# Patient Record
Sex: Female | Born: 1970 | Race: White | Hispanic: No | Marital: Married | Smoking: Never smoker
Health system: Southern US, Community
[De-identification: ages and names within clinical notes are randomized; demographics above are authoritative.]

## PROBLEM LIST (undated history)

## (undated) DIAGNOSIS — E66812 Obesity, class 2: Secondary | ICD-10-CM

## (undated) DIAGNOSIS — I10 Essential (primary) hypertension: Secondary | ICD-10-CM

## (undated) DIAGNOSIS — R569 Unspecified convulsions: Secondary | ICD-10-CM

## (undated) DIAGNOSIS — Z8619 Personal history of other infectious and parasitic diseases: Secondary | ICD-10-CM

## (undated) DIAGNOSIS — K760 Fatty (change of) liver, not elsewhere classified: Secondary | ICD-10-CM

## (undated) DIAGNOSIS — E119 Type 2 diabetes mellitus without complications: Principal | ICD-10-CM

## (undated) DIAGNOSIS — D689 Coagulation defect, unspecified: Secondary | ICD-10-CM

## (undated) DIAGNOSIS — E042 Nontoxic multinodular goiter: Secondary | ICD-10-CM

## (undated) DIAGNOSIS — M6788 Other specified disorders of synovium and tendon, other site: Secondary | ICD-10-CM

## (undated) DIAGNOSIS — I82409 Acute embolism and thrombosis of unspecified deep veins of unspecified lower extremity: Secondary | ICD-10-CM

## (undated) DIAGNOSIS — Z8672 Personal history of thrombophlebitis: Secondary | ICD-10-CM

## (undated) DIAGNOSIS — E669 Obesity, unspecified: Secondary | ICD-10-CM

## (undated) DIAGNOSIS — K8020 Calculus of gallbladder without cholecystitis without obstruction: Secondary | ICD-10-CM

## (undated) DIAGNOSIS — U071 COVID-19: Secondary | ICD-10-CM

## (undated) DIAGNOSIS — G8929 Other chronic pain: Secondary | ICD-10-CM

## (undated) DIAGNOSIS — K219 Gastro-esophageal reflux disease without esophagitis: Principal | ICD-10-CM

## (undated) DIAGNOSIS — E785 Hyperlipidemia, unspecified: Secondary | ICD-10-CM

## (undated) HISTORY — PX: OTHER SURGICAL HISTORY: SHX169

## (undated) HISTORY — DX: Nontoxic multinodular goiter: E04.2

## (undated) HISTORY — PX: WISDOM TOOTH EXTRACTION: SHX21

## (undated) HISTORY — DX: Obesity, class 2: E66.812

## (undated) HISTORY — DX: Type 2 diabetes mellitus without complications: E11.9

## (undated) HISTORY — DX: Calculus of gallbladder without cholecystitis without obstruction: K80.20

## (undated) HISTORY — DX: Other chronic pain: G89.29

## (undated) HISTORY — DX: Other specified disorders of synovium and tendon, other site: M67.88

## (undated) HISTORY — DX: Unspecified convulsions: R56.9

## (undated) HISTORY — DX: Acute embolism and thrombosis of unspecified deep veins of unspecified lower extremity: I82.409

## (undated) HISTORY — DX: Personal history of other infectious and parasitic diseases: Z86.19

## (undated) HISTORY — PX: CARDIOVASCULAR STRESS TEST: SHX262

## (undated) HISTORY — DX: Fatty (change of) liver, not elsewhere classified: K76.0

## (undated) HISTORY — PX: TRANSTHORACIC ECHOCARDIOGRAM: SHX275

## (undated) HISTORY — DX: COVID-19: U07.1

## (undated) HISTORY — DX: Hyperlipidemia, unspecified: E78.5

## (undated) HISTORY — DX: Personal history of thrombophlebitis: Z86.72

## (undated) HISTORY — DX: Essential (primary) hypertension: I10

## (undated) HISTORY — DX: Obesity, unspecified: E66.9

## (undated) HISTORY — DX: Gastro-esophageal reflux disease without esophagitis: K21.9

---
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
## 1997-12-25 ENCOUNTER — Other Ambulatory Visit: Admission: RE | Admit: 1997-12-25 | Discharge: 1997-12-25 | Payer: Self-pay | Admitting: Obstetrics and Gynecology

## 1998-10-17 ENCOUNTER — Other Ambulatory Visit: Admission: RE | Admit: 1998-10-17 | Discharge: 1998-10-17 | Payer: Self-pay | Admitting: *Deleted

## 1999-12-12 ENCOUNTER — Other Ambulatory Visit: Admission: RE | Admit: 1999-12-12 | Discharge: 1999-12-12 | Payer: Self-pay | Admitting: *Deleted

## 2000-11-05 ENCOUNTER — Other Ambulatory Visit: Admission: RE | Admit: 2000-11-05 | Discharge: 2000-11-05 | Payer: Self-pay | Admitting: *Deleted

## 2001-03-23 ENCOUNTER — Encounter: Admission: RE | Admit: 2001-03-23 | Discharge: 2001-03-23 | Payer: Self-pay | Admitting: *Deleted

## 2001-05-19 ENCOUNTER — Encounter: Payer: Self-pay | Admitting: Obstetrics and Gynecology

## 2001-05-19 ENCOUNTER — Encounter (INDEPENDENT_AMBULATORY_CARE_PROVIDER_SITE_OTHER): Payer: Self-pay

## 2001-05-19 ENCOUNTER — Inpatient Hospital Stay (HOSPITAL_COMMUNITY): Admission: AD | Admit: 2001-05-19 | Discharge: 2001-05-21 | Payer: Self-pay | Admitting: *Deleted

## 2001-06-03 ENCOUNTER — Inpatient Hospital Stay (HOSPITAL_COMMUNITY): Admission: AD | Admit: 2001-06-03 | Discharge: 2001-06-07 | Payer: Self-pay | Admitting: Obstetrics and Gynecology

## 2001-06-22 ENCOUNTER — Other Ambulatory Visit: Admission: RE | Admit: 2001-06-22 | Discharge: 2001-06-22 | Payer: Self-pay | Admitting: Obstetrics and Gynecology

## 2002-07-21 ENCOUNTER — Other Ambulatory Visit: Admission: RE | Admit: 2002-07-21 | Discharge: 2002-07-21 | Payer: Self-pay | Admitting: *Deleted

## 2003-01-31 ENCOUNTER — Encounter: Admission: RE | Admit: 2003-01-31 | Discharge: 2003-01-31 | Payer: Self-pay | Admitting: Obstetrics and Gynecology

## 2003-03-01 ENCOUNTER — Inpatient Hospital Stay (HOSPITAL_COMMUNITY): Admission: AD | Admit: 2003-03-01 | Discharge: 2003-03-01 | Payer: Self-pay | Admitting: Obstetrics and Gynecology

## 2003-03-14 ENCOUNTER — Inpatient Hospital Stay (HOSPITAL_COMMUNITY): Admission: AD | Admit: 2003-03-14 | Discharge: 2003-03-14 | Payer: Self-pay | Admitting: *Deleted

## 2003-03-23 ENCOUNTER — Inpatient Hospital Stay (HOSPITAL_COMMUNITY): Admission: RE | Admit: 2003-03-23 | Discharge: 2003-03-26 | Payer: Self-pay | Admitting: Obstetrics and Gynecology

## 2003-03-27 ENCOUNTER — Encounter: Admission: RE | Admit: 2003-03-27 | Discharge: 2003-04-26 | Payer: Self-pay | Admitting: *Deleted

## 2003-04-27 ENCOUNTER — Encounter: Admission: RE | Admit: 2003-04-27 | Discharge: 2003-05-27 | Payer: Self-pay | Admitting: *Deleted

## 2003-04-27 ENCOUNTER — Other Ambulatory Visit: Admission: RE | Admit: 2003-04-27 | Discharge: 2003-04-27 | Payer: Self-pay | Admitting: Obstetrics and Gynecology

## 2004-04-30 ENCOUNTER — Other Ambulatory Visit: Admission: RE | Admit: 2004-04-30 | Discharge: 2004-04-30 | Payer: Self-pay | Admitting: Obstetrics and Gynecology

## 2004-12-02 ENCOUNTER — Ambulatory Visit (HOSPITAL_COMMUNITY): Admission: RE | Admit: 2004-12-02 | Discharge: 2004-12-02 | Payer: Self-pay | Admitting: Obstetrics and Gynecology

## 2004-12-02 ENCOUNTER — Encounter (INDEPENDENT_AMBULATORY_CARE_PROVIDER_SITE_OTHER): Payer: Self-pay | Admitting: Specialist

## 2005-01-17 IMAGING — US US OB COMP +14 WK
1 series · 13 of 28 positions shown · non-contrast
Comparison: none

CLINICAL DATA: 32-year-old female at 32 weeks 5 days gestational age by LMP.  Cramping.

[Series 1: unknown · 0.28mm/px · 13 of 71 slices shown]
[im 3/71]
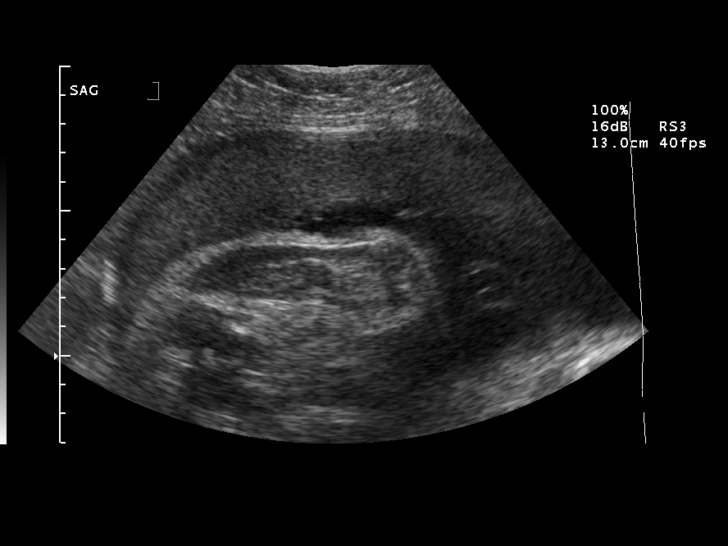
[im 8/71]
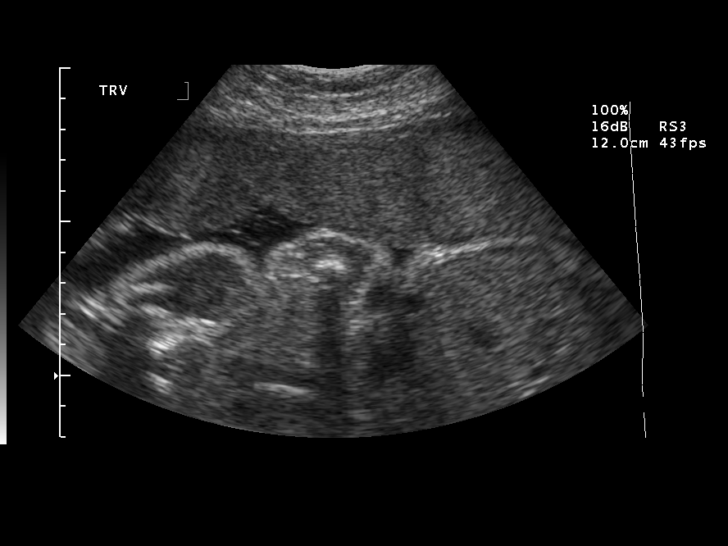
[im 13/71]
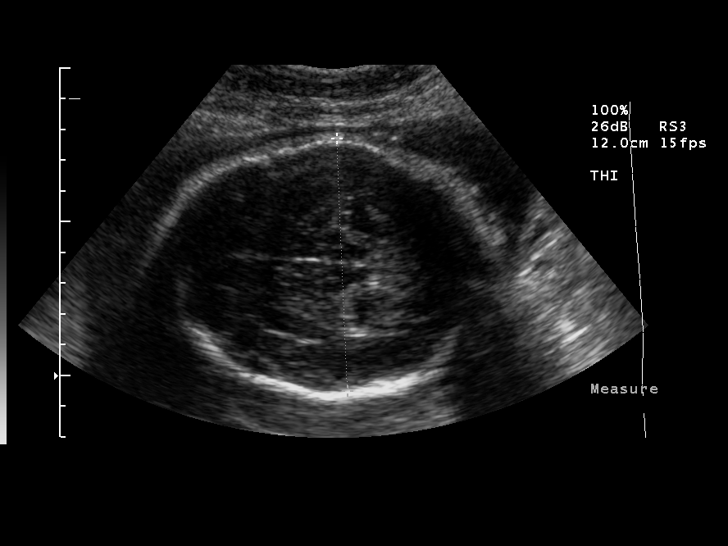
[im 19/71]
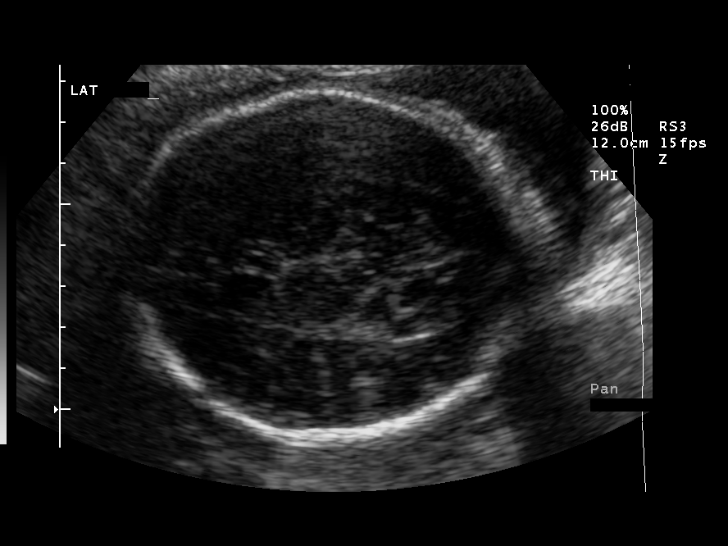
[im 24/71]
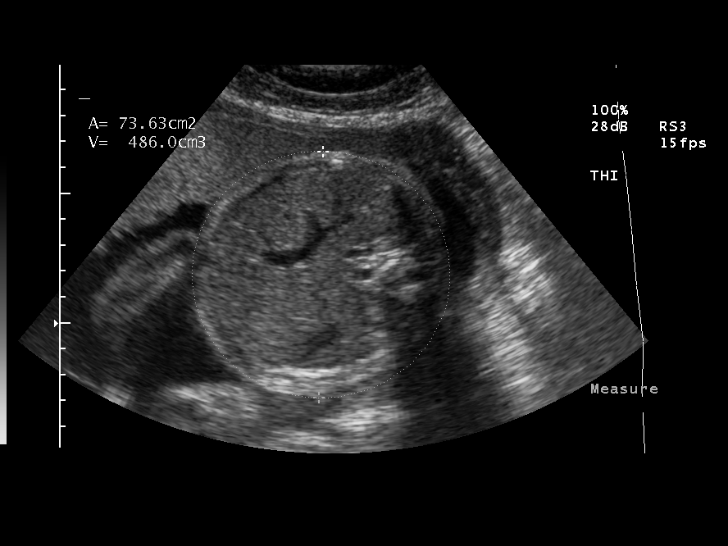
[im 29/71]
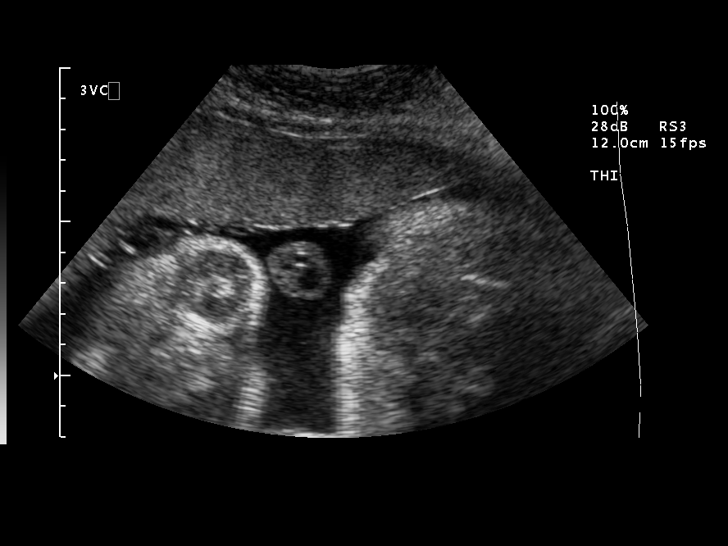
[im 37/71]
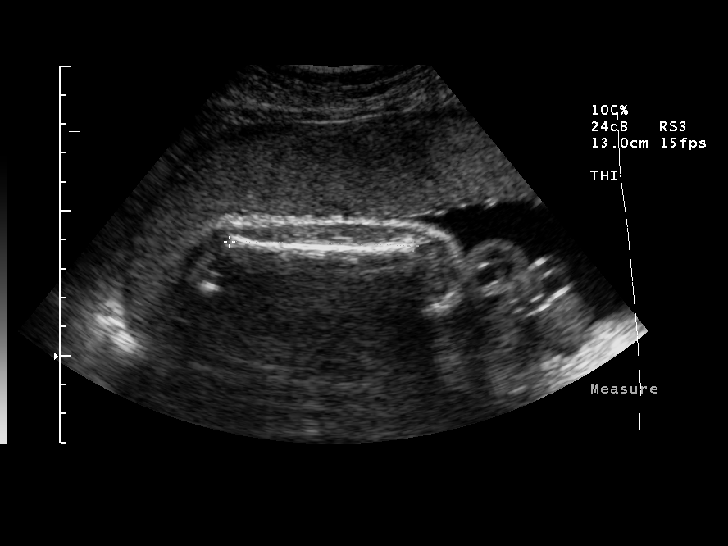
[im 42/71]
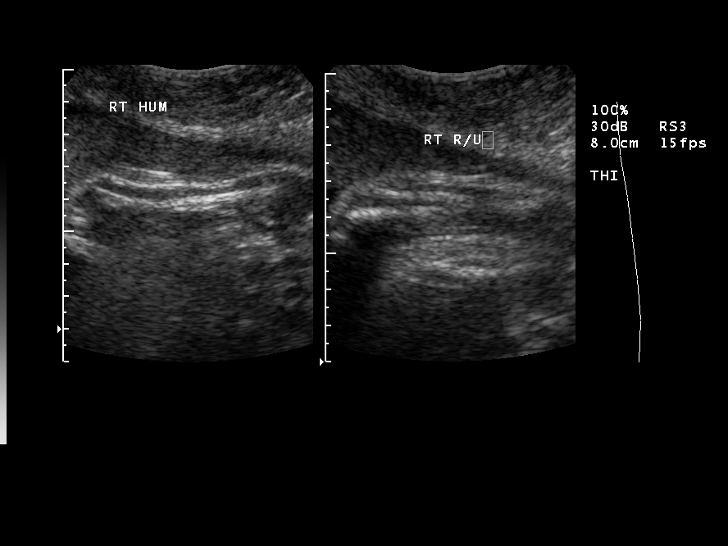
[im 47/71]
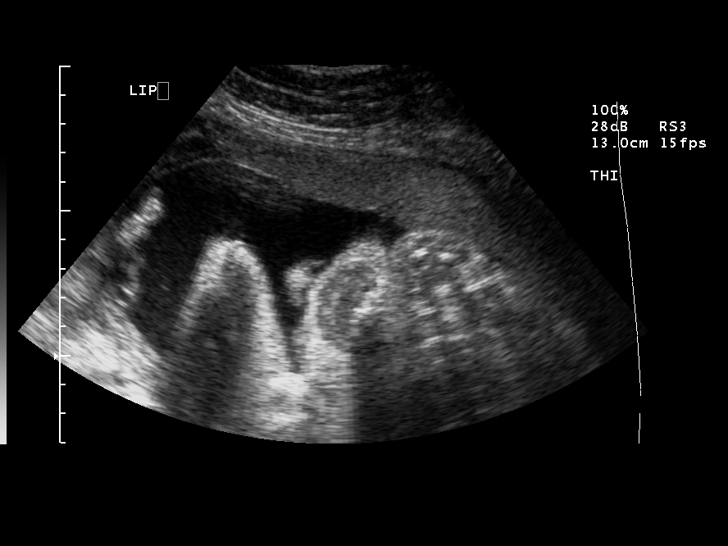
[im 52/71]
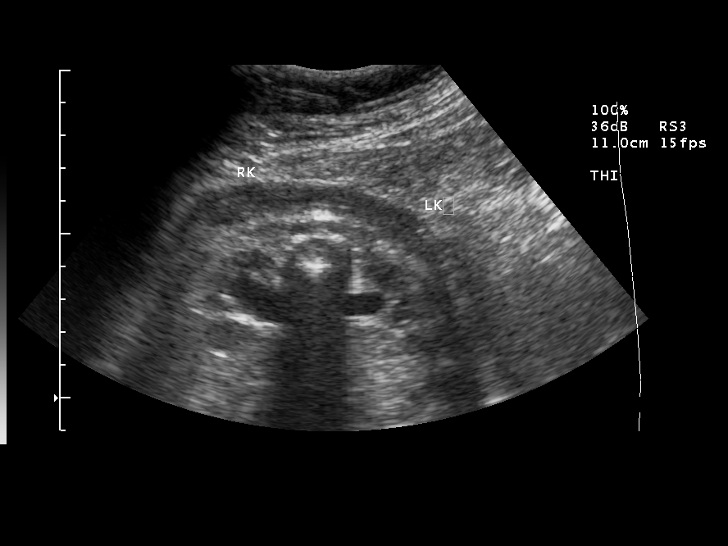
[im 58/71]
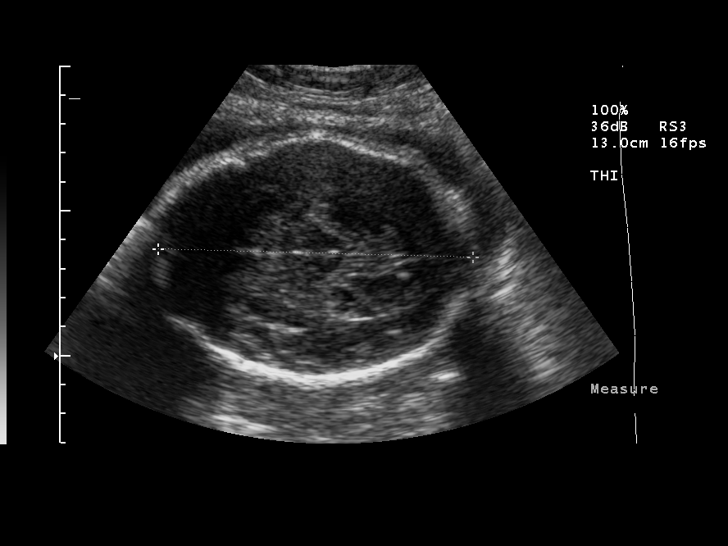
[im 63/71]
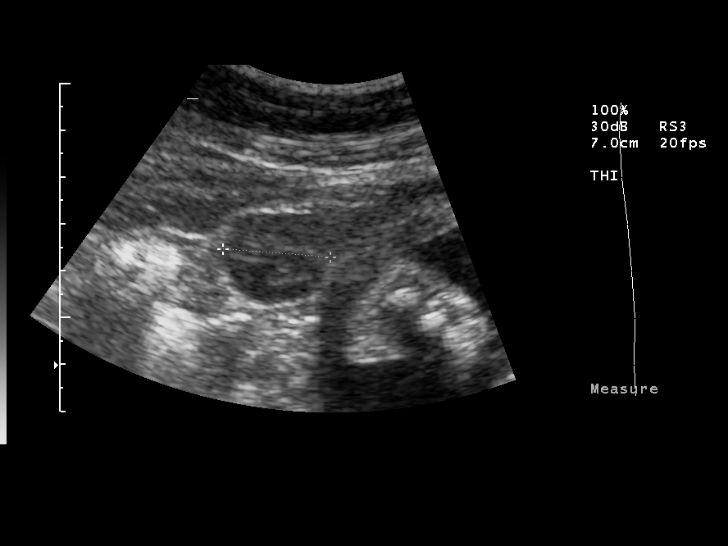
[im 68/71]
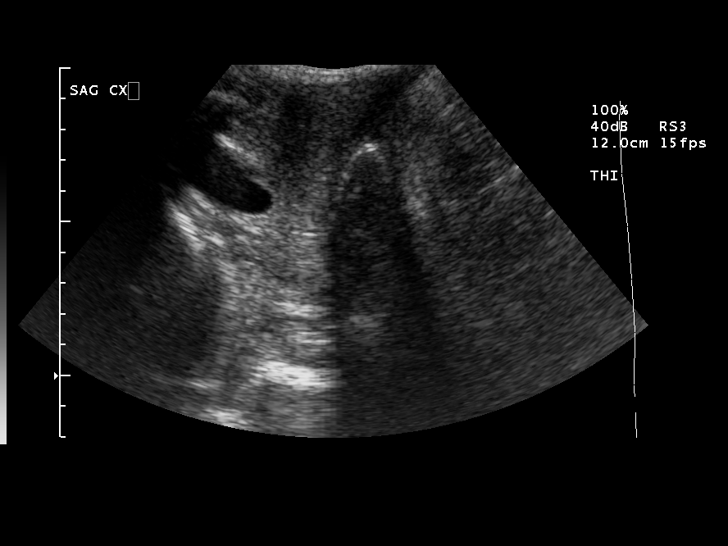

[13 of 28 positions shown; findings below may reference images not displayed]

OBSTETRICAL ULTRASOUND

 NUMBER OF FETUSES:  1
 HEART RATE:  125 BPM
 MOVEMENT:  Yes
 BREATHING:  Yes      
 PRESENTATION:  Cephalic
 PLACENTAL LOCATION:  Anterior
 GRADE:  I
 PREVIA:  No
 AMNIOTIC FLUID (SUBJECTIVE):  Normal
 AMNIOTIC FLUID (OBJECTIVE): 20.3 AFI (6th-36th %ile = 8.3 to 24.5 cm for 33 weeks)

 FETAL BIOMETRY
 BPD:   8.2 cm, 33W 0D
 HC:   30.0 cm, 33W 1D
 AC:  30.2 cm, 34W 2D
 FL:  6.3 cm, 32 W 5D
 MEAN GA: 33W 2D
 EFW: 9912 g (H) 75th-83th %ile (3145-3530 g) for 33 weeks

 FETAL ANATOMY
 LATERAL VENTRICLES:  Visualized   
 THALAMI/CSP:  Visualized     
 POSTERIOR FOSSA:  Visualized   
 NUCHAL REGION:  N/A  
 SPINE:  Not visualized     
 4 CHAMBER HEART ON LEFT:  Visualized 
 STOMACH ON LEFT:  Visualized     
 3 VESSEL CORD:  Visualized   
 CORD INSERTION SITE:  Not visualized   
 KIDNEYS:  Visualized   
 BLADDER:  Visualized   
 EXTREMITIES:  Not visualized     

 ADDITIONAL ANATOMY VISUALIZED:  Upper lip, orbits, profile, diaphragm, heel.
 Comment:  The right renal pelvis measures 11-12 mm in AP diameter.  The left renal pelvis measures 8-9 mm in AP diameter.  

 MATERNAL FINDINGS
 CERVIX: 3.1 cm translabially
IMPRESSION: Single living intrauterine fetus in cephalic presentation with subjectively and quantitatively normal amniotic fluid volume.  Mean gestational age by ultrasound today is 33 weeks 2 days, which correlates well with the gestational age by LMP.  
 Bilateral pyelectasis.  A repeat sonographic assessment in four weeks is recommended to further evaluate.
 The remaining visualized fetal anatomy is unremarkable although assessment is limited by the advanced gestational age.

## 2005-06-03 ENCOUNTER — Other Ambulatory Visit: Admission: RE | Admit: 2005-06-03 | Discharge: 2005-06-03 | Payer: Self-pay | Admitting: Obstetrics and Gynecology

## 2007-08-06 ENCOUNTER — Encounter: Admission: RE | Admit: 2007-08-06 | Discharge: 2007-08-06 | Payer: Self-pay | Admitting: Sports Medicine

## 2009-03-17 HISTORY — PX: TUBAL LIGATION: SHX77

## 2009-04-25 ENCOUNTER — Encounter: Admission: RE | Admit: 2009-04-25 | Discharge: 2009-07-24 | Payer: Self-pay | Admitting: Family Medicine

## 2009-07-31 ENCOUNTER — Encounter: Admission: RE | Admit: 2009-07-31 | Discharge: 2009-07-31 | Payer: Self-pay | Admitting: Family Medicine

## 2009-09-19 ENCOUNTER — Encounter: Admission: RE | Admit: 2009-09-19 | Discharge: 2009-09-19 | Payer: Self-pay | Admitting: Family Medicine

## 2009-10-12 ENCOUNTER — Ambulatory Visit (HOSPITAL_COMMUNITY): Admission: RE | Admit: 2009-10-12 | Discharge: 2009-10-12 | Payer: Self-pay | Admitting: Obstetrics and Gynecology

## 2009-12-12 ENCOUNTER — Encounter: Payer: Self-pay | Admitting: Family Medicine

## 2009-12-17 LAB — CONVERTED CEMR LAB: Pap Smear: NORMAL

## 2009-12-18 ENCOUNTER — Encounter
Admission: RE | Admit: 2009-12-18 | Discharge: 2009-12-18 | Payer: Self-pay | Source: Home / Self Care | Attending: Family Medicine | Admitting: Family Medicine

## 2010-03-21 ENCOUNTER — Ambulatory Visit
Admission: RE | Admit: 2010-03-21 | Discharge: 2010-03-21 | Payer: Self-pay | Source: Home / Self Care | Attending: Family Medicine | Admitting: Family Medicine

## 2010-03-21 DIAGNOSIS — E119 Type 2 diabetes mellitus without complications: Secondary | ICD-10-CM | POA: Insufficient documentation

## 2010-03-21 DIAGNOSIS — I10 Essential (primary) hypertension: Secondary | ICD-10-CM

## 2010-03-21 DIAGNOSIS — E785 Hyperlipidemia, unspecified: Secondary | ICD-10-CM

## 2010-03-21 DIAGNOSIS — K219 Gastro-esophageal reflux disease without esophagitis: Secondary | ICD-10-CM | POA: Insufficient documentation

## 2010-03-21 DIAGNOSIS — Z8672 Personal history of thrombophlebitis: Secondary | ICD-10-CM | POA: Insufficient documentation

## 2010-03-21 HISTORY — DX: Essential (primary) hypertension: I10

## 2010-03-21 HISTORY — DX: Type 2 diabetes mellitus without complications: E11.9

## 2010-03-21 HISTORY — DX: Gastro-esophageal reflux disease without esophagitis: K21.9

## 2010-03-21 HISTORY — DX: Hyperlipidemia, unspecified: E78.5

## 2010-04-18 NOTE — Assessment & Plan Note (Signed)
Summary: BRAND NEW PT/TO EST/CJR   Vital Signs:  Patient profile:   40 year old female Menstrual status:  regular LMP:     03/17/2010 Height:      62.50 inches Weight:      225 pounds BMI:     40.64 Temp:     98.3 degrees F oral Pulse rate:   100 / minute Pulse rhythm:   regular Resp:     12 per minute BP sitting:   120 / 84  (left arm) Cuff size:   large  Vitals Entered By: Sid Falcon LPN (March 21, 2010 3:20 PM)  Nutrition Counseling: Patient's BMI is greater than 25 and therefore counseled on weight management options. CC: Hypertension Management LMP (date): 03/17/2010     Menstrual Status regular Enter LMP: 03/17/2010 Last PAP Result normal   History of Present Illness: Here to establish care.  Pt has hx of Type 2 diabetes, hypertension, hyperlipidemia, remote DVT following C-section, obesity, and fatty liver changes/. Dxed with diabetes within the past year with pos lifestyle chnages. has lost 25 pounds and has seen nutritionist.  Meds reviewed.  Compliant with all.  A1C 6.8 in Sept. exercising regularly.  Hyperlipidemia treated with Simvastatin and no side  effects.  Hypertension History:      She denies headache, chest pain, palpitations, dyspnea with exertion, orthopnea, PND, peripheral edema, neurologic problems, and side effects from treatment.        Positive major cardiovascular risk factors include diabetes, hyperlipidemia, and hypertension.  Negative major cardiovascular risk factors include female age less than 4 years old and non-tobacco-user status.     Preventive Screening-Counseling & Management  Alcohol-Tobacco     Smoking Status: never  Caffeine-Diet-Exercise     Does Patient Exercise: yes  Past History:  Family History: Last updated: 03/21/2010 mother, colon CA, ovarian CA, lung CA, heart disease, hypertension, hyperlidemia, diabetes type ll  Father, prostate CA, elevated cholesterol, hypertension, diabetes type ll  Social  History: Last updated: 03/21/2010 Occupation: Married Never Smoked Alcohol use-no Regular exercise-yes 2 pregnancies 2 live birthe  Risk Factors: Exercise: yes (03/21/2010)  Risk Factors: Smoking Status: never (03/21/2010)  Past Medical History: Diabetes, Type ll Eating disorder GERD Hyperlipidemia Hypertension blood clot Seizure UTI  Past Surgical History: Caesarean section 2003 & 2005 Tubal ligation 2011 PMH-FH-SH reviewed for relevance  Family History: mother, colon CA, ovarian CA, lung CA, heart disease, hypertension, hyperlidemia, diabetes type ll  Father, prostate CA, elevated cholesterol, hypertension, diabetes type ll  Social History: Occupation: Married Never Smoked Alcohol use-no Regular exercise-yes 2 pregnancies 2 live birthe Smoking Status:  never Occupation:  employed Does Patient Exercise:  yes  Review of Systems  The patient denies anorexia, fever, vision loss, decreased hearing, hoarseness, chest pain, syncope, dyspnea on exertion, peripheral edema, prolonged cough, headaches, hemoptysis, abdominal pain, melena, hematochezia, severe indigestion/heartburn, and muscle weakness.    Physical Exam  General:  Well-developed,well-nourished,in no acute distress; alert,appropriate and cooperative throughout examination Eyes:  pupils equal, pupils round, and pupils reactive to light.   Mouth:  Oral mucosa and oropharynx without lesions or exudates.  Teeth in good repair. Neck:  No deformities, masses, or tenderness noted. Lungs:  Normal respiratory effort, chest expands symmetrically. Lungs are clear to auscultation, no crackles or wheezes. Heart:  Normal rate and regular rhythm. S1 and S2 normal without gallop, murmur, click, rub or other extra sounds. Extremities:  No clubbing, cyanosis, edema, or deformity noted with normal full range of motion of all  joints.   Skin:  no rashes and no suspicious lesions.   Cervical Nodes:  No lymphadenopathy  noted Psych:  normally interactive, good eye contact, not anxious appearing, and not depressed appearing.     Impression & Recommendations:  Problem # 1:  HYPERTENSION (ICD-401.9)  Her updated medication list for this problem includes:    Enalapril Maleate 2.5 Mg Tabs (Enalapril maleate) ..... One daily  Problem # 2:  HYPERLIPIDEMIA (ICD-272.4)  Her updated medication list for this problem includes:    Simvastatin 10 Mg Tabs (Simvastatin) ..... Once daily in pm  Problem # 3:  DIAB W/O COMP TYPE II/UNS NOT STATED UNCNTRL (ICD-250.00) continue weight loss efforts and repeat in 3 months A1C. Her updated medication list for this problem includes:    Metformin Hcl 500 Mg Tabs (Metformin hcl) .Marland Kitchen... 1 tab in am, 2 tabs in pm    Enalapril Maleate 2.5 Mg Tabs (Enalapril maleate) ..... One daily  Problem # 4:  DEEP VENOUS THROMBOPHLEBITIS, HX OF (ICD-V12.52)  Complete Medication List: 1)  Metformin Hcl 500 Mg Tabs (Metformin hcl) .Marland Kitchen.. 1 tab in am, 2 tabs in pm 2)  Enalapril Maleate 2.5 Mg Tabs (Enalapril maleate) .... One daily 3)  Simvastatin 10 Mg Tabs (Simvastatin) .... Once daily in pm  Hypertension Assessment/Plan:      The patient's hypertensive risk group is category C: Target organ damage and/or diabetes.  Today's blood pressure is 120/84.    Patient Instructions: 1)  It is important that you exercise reguarly at least 20 minutes 5 times a week. If you develop chest pain, have severe difficulty breathing, or feel very tired, stop exercising immediately and seek medical attention.  2)  You need to lose weight. Consider a lower calorie diet and regular exercise.  3)  Check your blood sugars regularly. If your readings are usually above: 140 or below 70 you should contact our office.  4)  It is important that your diabetic A1c level is checked every 3 months.  5)  See your eye doctor yearly to check for diabetic eye damage. 6)  Check your feet each night  for sore areas, calluses or  signs of infection.  7)  Please schedule a follow-up appointment in 3 months .    Orders Added: 1)  New Patient Level III [16109]    Preventive Care Screening  Mammogram:    Date:  12/17/2009    Results:  normal   Pap Smear:    Date:  12/17/2009    Results:  normal

## 2010-04-18 NOTE — Letter (Signed)
Summary: Records from [facility] at [facility] 2010 - 2011  Records from [facility] at [facility] 2010 - 2011   Imported By: Maryln Gottron 03/27/2010 10:51:20  _____________________________________________________________________  External Attachment:    Type:   Image     Comment:   External Document

## 2010-04-29 ENCOUNTER — Ambulatory Visit (INDEPENDENT_AMBULATORY_CARE_PROVIDER_SITE_OTHER): Payer: BC Managed Care – PPO | Admitting: Family Medicine

## 2010-04-29 ENCOUNTER — Encounter: Payer: Self-pay | Admitting: Family Medicine

## 2010-04-29 VITALS — BP 130/80 | Temp 98.9°F | Ht 62.75 in | Wt 223.0 lb

## 2010-04-29 DIAGNOSIS — S335XXA Sprain of ligaments of lumbar spine, initial encounter: Secondary | ICD-10-CM

## 2010-04-29 DIAGNOSIS — K219 Gastro-esophageal reflux disease without esophagitis: Secondary | ICD-10-CM

## 2010-04-29 NOTE — Patient Instructions (Signed)
Low Back Sprain with Rehab    A sprain is an injury in which a ligament is torn. The ligaments of the lower back are vulnerable to sprains. However, they are strong and require great force to be injured. These ligaments are important for stabilizing the spinal column. Sprains are classified into three categories. Grade 1 sprains cause pain, but the tendon is not lengthened. Grade 2 sprains include a lengthened ligament, due to the ligament being stretched or partially ruptured. With grade 2 sprains there is still function, although the function may be decreased. Grade 3 sprains involve a complete tear of the tendon or muscle, and function is usually impaired.   SYMPTOMS  Severe pain in the lower back.  Sometimes, a feeling of a "pop," "snap," or tear, at the time of injury.  Tenderness and sometimes swelling at the injury site.  Uncommonly, bruising (contusion) within 48 hours of injury.  Muscle spasms in the back.   CAUSES  Low back sprains occur when a force is placed on the ligaments that is greater than they can handle. Common causes of injury include:  Performing a stressful act while off-balance.  Repetitive stressful activities that involve movement of the lower back.  Direct hit (trauma) to the lower back.   RISK INCREASES WITH   Contact sports (football, wrestling).  Collisions (major skiing accidents).  Sports that require throwing or lifting (baseball, weightlifting).  Sports involving twisting of the spine (gymnastics, diving, tennis, golf).  Poor strength and flexibility.  Inadequate protection.  Previous back injury or surgery (especially fusion).   PREVENTIVE MEASURES   Wear properly fitted and padded protective equipment.  Warm up and stretch properly before activity.  Allow for adequate recovery between workouts.  Maintain physical fitness: l Strength, flexibility, and endurance. l Cardiovascular fitness.  Maintain a healthy body weight.     PROGNOSIS If treated properly, low back sprains usually heal with non-surgical treatment. The length of time for healing depends on the severity of the injury.    POSSIBLE COMPLICATIONS   Recurring symptoms, resulting in a chronic problem.  Chronic inflammation and pain in the low back.  Delayed healing or resolution of symptoms, especially if activity is resumed too soon.  Prolonged impairment.  Unstable or arthritic joints of the low back.   GENERAL TREATMENT CONSIDERATIONS  Treatment first involves the use of ice and medicine, to reduce pain and inflammation. The use of strengthening and stretching exercises may help reduce pain with activity. These exercises may be performed at home or with a therapist. Severe injuries may require referral to a therapist for further evaluation and treatment, such as ultrasound. Your caregiver may advise that you wear a back brace or corset, to help reduce pain and discomfort. Often, prolonged bed rest results in greater harm then benefit. Corticosteroid injections may be recommended. However, these should be reserved for the most serious cases. It is important to avoid using your back when lifting objects. At night, sleep on your back on a firm mattress, with a pillow placed under your knees. If non-surgical treatment is unsuccessful, surgery may be needed.    MEDICATION:   If pain medicine is needed, nonsteroidal anti-inflammatory medicines (aspirin and ibuprofen), or other minor pain relievers (acetaminophen), are often advised.   Do not take pain medicine for 7 days before surgery.   Prescription pain relievers may be given, if your caregiver thinks they are needed. Use only as directed and only as much as you need.  Ointments applied to   the skin may be helpful.  Corticosteroid injections may be given by your caregiver. These injections should be reserved for the most serious cases, because they may only be given a certain number of times.    HEAT AND COLD:   Cold treatment (icing) should be applied for 10 to 15 minutes every 2 to 3 hours for inflammation and pain, and immediately after activity that aggravates your symptoms. Use ice packs or an ice massage.  Heat treatment may be used before performing stretching and strengthening activities prescribed by your caregiver, physical therapist, or athletic trainer. Use a heat pack or a warm water soak.     SEEK MEDICAL CARE IF:   Symptoms get worse or do not improve in 2 to 4 weeks, despite treatment.  You develop numbness or weakness in either leg.  You lose bowel or bladder function.  Any of the following occur after surgery: fever, increased pain, swelling, redness, drainage of fluids, or bleeding in the affected area.  New, unexplained symptoms develop. (Drugs used in treatment may produce side effects.)     EXERCISES   RANGE OF MOTION AND STRETCHING EXERCISES - Low Back Sprain Most people with lower back pain will find that their symptoms get worse with excessive bending forward (flexion) or arching at the lower back (extension). The exercises that will help resolve your symptoms will focus on the opposite motion.    Your physician, physical therapist or athletic trainer will help you determine which exercises will be most helpful to resolve your lower back pain. Do not complete any exercises without first consulting with your caregiver. Discontinue any exercises which make your symptoms worse, until you speak to your caregiver.    If you have pain, numbness or tingling which travels down into your buttocks, leg or foot, the goal of the therapy is for these symptoms to move closer to your back and eventually resolve. Sometimes, these leg symptoms will get better, but your lower back pain may worsen. This is often an indication of progress in your rehabilitation. Be very alert to any changes in your symptoms and the activities in which you participated in the 24 hours prior  to the change. Sharing this information with your caregiver will allow him or her to most efficiently treat your condition.   These exercises may help you when beginning to rehabilitate your injury. Your symptoms may resolve with or without further involvement from your physician, physical therapist or athletic trainer. While completing these exercises, remember:   Restoring tissue flexibility helps normal motion to return to the joints. This allows healthier, less painful movement and activity.  An effective stretch should be held for at least 30 seconds.  A stretch should never be painful. You should only feel a gentle lengthening or release in the stretched tissue.      FLEXION RANGE OF MOTION AND STRETCHING EXERCISES:  STRETCH - Flexion, Single Knee to Chest   Lie on a firm bed or floor with both legs extended in front of you.  Keeping one leg in contact with the floor, bring your opposite knee to your chest. Hold your leg in place by either grabbing behind your thigh or at your knee.  Pull until you feel a gentle stretch in your low back. Hold __________ seconds.  Slowly release your grasp and repeat the exercise with the opposite side. Repeat __________ times. Complete this exercise __________ times per day.     STRETCH - Flexion, Double Knee to Chest     Lie on a firm bed or floor with both legs extended in front of you.  Keeping one leg in contact with the floor, bring your opposite knee to your chest.    Tense your stomach muscles to support your back and then lift your other knee to your chest. Hold your legs in place by either grabbing behind your thighs or at your knees.  Pull both knees toward your chest until you feel a gentle stretch in your low back. Hold __________ seconds.  Tense your stomach muscles and slowly return one leg at a time to the floor. Repeat __________ times. Complete this exercise __________ times per day.     STRETCH - Low Trunk Rotation   Lie  on a firm bed or floor. Keeping your legs in front of you, bend your knees so they are both pointed toward the ceiling and your feet are flat on the floor.  Extend your arms out to the side. This will stabilize your upper body by keeping your shoulders in contact with the floor.  Gently and slowly drop both knees together to one side until you feel a gentle stretch in your low back. Hold for __________ seconds.   Tense your stomach muscles to support your lower back as you bring your knees back to the starting position. Repeat the exercise to the other side. Repeat __________ times. Complete this exercise __________ times per day      EXTENSION RANGE OF MOTION AND FLEXIBILITY EXERCISES:  STRETCH - Extension, Prone on Elbows   Lie on your stomach on the floor, a bed will be too soft. Place your palms about shoulder width apart and at the height of your head.  Place your elbows under your shoulders. If this is too painful, stack pillows under your chest.  Allow your body to relax so that your hips drop lower and make contact more completely with the floor.  Hold this position for __________ seconds.  Slowly return to lying flat on the floor. Repeat __________ times. Complete this exercise __________ times per day.     RANGE OF MOTION - Extension, Prone Press Ups   Lie on your stomach on the floor, a bed will be too soft. Place your palms about shoulder width apart and at the height of your head.  Keeping your back as relaxed as possible, slowly straighten your elbows while keeping your hips on the floor. You may adjust the placement of your hands to maximize your comfort. As you gain motion, your hands will come more underneath your shoulders.  Hold this position __________ seconds.  Slowly return to lying flat on the floor. Repeat __________ times. Complete this exercise __________ times per day.     RANGE OF MOTION- Quadruped, Neutral Spine   Assume a hands and knees position  on a firm surface. Keep your hands under your shoulders and your knees under your hips. You may place padding under your knees for comfort.    Drop your head and point your tailbone toward the ground below you. This will round out your lower back like an angry cat. Hold this position for __________ seconds.   Slowly lift your head and release your tail bone so that your back sags into a large arch, like an old horse.  Hold this position for __________ seconds.   Repeat this until you feel limber in your low back.  Now, find your "sweet spot." This will be the most comfortable position somewhere between the two previous   positions. This is your neutral spine. Once you have found this position, tense your stomach muscles to support your low back.  Hold this position for __________ seconds. Repeat __________ times. Complete this exercise __________ times per day.      STRENGTHENING EXERCISES - Low Back Sprain These exercises may help you when beginning to rehabilitate your injury. These exercises should be done near your "sweet spot." This is the neutral, low-back arch, somewhere between fully rounded and fully arched, that is your least painful position. When performed in this safe range of motion, these exercises can be used for people who have either a flexion or extension based injury. These exercises may resolve your symptoms with or without further involvement from your physician, physical therapist or athletic trainer. While completing these exercises, remember:   Muscles can gain both the endurance and the strength needed for everyday activities through controlled exercises.  Complete these exercises as instructed by your physician, physical therapist or athletic trainer. Increase the resistance and repetitions only as guided.  You may experience muscle soreness or fatigue, but the pain or discomfort you are trying to eliminate should never worsen during these exercises. If this pain does  worsen, stop and make certain you are following the directions exactly. If the pain is still present after adjustments, discontinue the exercise until you can discuss the trouble with your caregiver.     STRENGTHENING - Deep Abdominals, Pelvic Tilt   Lie on a firm bed or floor. Keeping your legs in front of you, bend your knees so they are both pointed toward the ceiling and your feet are flat on the floor.  Tense your lower abdominal muscles to press your low back into the floor.  This motion will rotate your pelvis so that your tail bone is scooping upwards rather than pointing at your feet or into the floor. With a gentle tension and even breathing, hold this position for __________ seconds. Repeat __________ times. Complete this exercise __________ times per day.      STRENGTHENING - Abdominals, Crunches   Lie on a firm bed or floor. Keeping your legs in front of you, bend your knees so they are both pointed toward the ceiling and your feet are flat on the floor. Cross your arms over your chest.    Slightly tip your chin down without bending your neck.  Tense your abdominals and slowly lift your trunk high enough to just clear your shoulder blades. Lifting higher can put excessive stress on the lower back and does not further strengthen your abdominal muscles.  Control your return to the starting position. Repeat __________ times. Complete this exercise __________ times per day.     STRENGTHENING - Quadruped, Opposite UE/LE Lift   Assume a hands and knees position on a firm surface. Keep your hands under your shoulders and your knees under your hips. You may place padding under your knees for comfort.    Find your neutral spine and gently tense your abdominal muscles so that you can maintain this position. Your shoulders and hips should form a rectangle that is parallel with the floor and is not twisted.   Keeping your trunk steady, lift your right hand no higher than your shoulder  and then your left leg no higher than your hip. Make sure you are not holding your breath. Hold this position for __________ seconds.  Continuing to keep your abdominal muscles tense and your back steady, slowly return to your starting position. Repeat with the   opposite arm and leg. Repeat __________ times. Complete this exercise __________ times per day.      STRENGTHENING - Abdominals and Quadriceps, Straight Leg Raise   Lie on a firm bed or floor with both legs extended in front of you.  Keeping one leg in contact with the floor, bend the other knee so that your foot can rest flat on the floor.  Find your neutral spine, and tense your abdominal muscles to maintain your spinal position throughout the exercise.  Slowly lift your straight leg off the floor about 6 inches for a count of 15, making sure to not hold your breath.  Still keeping your neutral spine, slowly lower your leg all the way to the floor.   Repeat this exercise with each leg __________ times. Complete this exercise __________ times per day.     POSTURE AND BODY MECHANICS CONSIDERATIONS - Low Back Sprain Keeping correct posture when sitting, standing or completing your activities will reduce the stress put on different body tissues, allowing injured tissues a chance to heal and limiting painful experiences. The following are general guidelines for improved posture. Your physician or physical therapist will provide you with any instructions specific to your needs. While reading these guidelines, remember:  The exercises prescribed by your provider will help you have the flexibility and strength to maintain correct postures.  The correct posture provides the best environment for your joints to work. All of your joints have less wear and tear when properly supported by a spine with good posture. This means you will experience a healthier, less painful body.  Correct posture must be practiced with all of your activities,  especially prolonged sitting and standing. Correct posture is as important when doing repetitive low-stress activities (typing) as it is when doing a single heavy-load activity (lifting).     RESTING POSITIONS Consider which positions are most painful for you when choosing a resting position. If you have pain with flexion-based activities (sitting, bending, stooping, squatting), choose a position that allows you to rest in a less flexed posture. You would want to avoid curling into a fetal position on your side. If your pain worsens with extension-based activities (prolonged standing, working overhead), avoid resting in an extended position such as sleeping on your stomach. Most people will find more comfort when they rest with their spine in a more neutral position, neither too rounded nor too arched. Lying on a non-sagging bed on your side with a pillow between your knees, or on your back with a pillow under your knees will often provide some relief.  Keep in mind, being in any one position for a prolonged period of time, no matter how correct your posture, can still lead to stiffness.    PROPER SITTING POSTURE In order to minimize stress and discomfort on your spine, you must sit with correct posture. Sitting with good posture should be effortless for a healthy body. Returning to good posture is a gradual process. Many people can work toward this most comfortably by using various supports until they have the flexibility and strength to maintain this posture on their own.   When sitting with proper posture, your ears will fall over your shoulders and your shoulders will fall over your hips. You should use the back of the chair to support your upper back. Your lower back will be in a neutral position, just slightly arched. You may place a small pillow or folded towel at the base of your lower back for    support.    When working at a desk, create an environment that supports good, upright posture.  Without extra support, muscles tire, which leads to excessive strain on joints and other tissues. Keep these recommendations in mind:   CHAIR:    A chair should be able to slide under your desk when your back makes contact with the back of the chair. This allows you to work closely.  The chair's height should allow your eyes to be level with the upper part of your monitor and your hands to be slightly lower than your elbows.      BODY POSITION  Your feet should make contact with the floor. If this is not possible, use a foot rest.  Keep your ears over your shoulders. This will reduce stress on your neck and low back.     INCORRECT SITTING POSTURES  If you are feeling tired and unable to assume a healthy sitting posture, do not slouch or slump. This puts excessive strain on your back tissues, causing more damage and pain. Healthier options include:  Using more support, like a lumbar pillow.  Switching tasks to something that requires you to be upright or walking.  Talking a brief walk.  Lying down to rest in a neutral-spine position.      PROLONGED STANDING WHILE SLIGHTLY LEANING FORWARD  When completing a task that requires you to lean forward while standing in one place for a long time, place either foot up on a stationary 2-4 inch high object to help maintain the best posture. When both feet are on the ground, the lower back tends to lose its slight inward curve. If this curve flattens (or becomes too large), then the back and your other joints will experience too much stress, tire more quickly, and can cause pain.       CORRECT STANDING POSTURES Proper standing posture should be assumed with all daily activities, even if they only take a few moments, like when brushing your teeth. As in sitting, your ears should fall over your shoulders and your shoulders should fall over your hips. You should keep a slight tension in your abdominal muscles to brace your spine. Your tailbone  should point down to the ground, not behind your body, resulting in an over-extended swayback posture.      INCORRECT STANDING POSTURES  Common incorrect standing postures include a forward head, locked knees and/or an excessive swayback.     WALKING Walk with an upright posture. Your ears, shoulders and hips should all line-up.     PROLONGED ACTIVITY IN A FLEXED POSITION When completing a task that requires you to bend forward at your waist or lean over a low surface, try to find a way to stabilize 3 out of 4 of your limbs. You can place a hand or elbow on your thigh or rest a knee on the surface you are reaching across. This will provide you more stability, so that your muscles do not tire as quickly. By keeping your knees relaxed, or slightly bent, you will also reduce stress across your lower back.     CORRECT LIFTING TECHNIQUES DO :   Assume a wide stance. This will provide you more stability and the opportunity to get as close as possible to the object which you are lifting.  Tense your abdominals to brace your spine. Bend at the knees and hips. Keeping your back locked in a neutral-spine position, lift using your leg muscles. Lift with your legs, keeping your   back straight.  Test the weight of unknown objects before attempting to lift them.  Try to keep your elbows locked down at your sides in order get the best strength from your shoulders when carrying an object.  Always ask for help when lifting heavy or awkward objects.    INCORRECT LIFTING TECHNIQUES DO NOT:   Lock your knees when lifting, even if it is a small object.  Bend and twist. Pivot at your feet or move your feet when needing to change directions.  Assume that you can safely pick up even a paperclip without proper posture.   Document Released: 03/03/2005  Document Re-Released: 12/29/2008 [employer] Patient Information 2011 [employer]. 

## 2010-04-29 NOTE — Progress Notes (Signed)
  Subjective:    Patient ID: Kristen Meyers, female    DOB: 05-Aug-1970, 40 y.o.   MRN: [id number]  HPI  Patient seen with low back pain which started about 3 weeks ago after lifting. Pain is lower lumbar area and radiates somewhat bilaterally. No clear radiculopathy symptoms.  Dull to achy type sensation. Similar occurrence in the past. Denies any numbness or weakness. Diclofenac and heat helps somewhat. Symptoms worse with change of position. No urine or stool incontinence.   Review of Systems  patient on the abdomen or weight changes. No fevers or chills. No dysuria.    Objective:   Physical Exam  patient is alert and in no distress.   chest clear to auscultation Heart regular rhythm and rate with no murmur Back no reproducible point tenderness Extremities no edema. Straight leg raise is negative. Neuro exam strength is full lower extremities. Gait reflexes 2+ ankle and knee. No sensory deficits       Assessment & Plan:   low back strain. Continue diclofenac. Continue low-dose cyclobenzaprine 5 mg each bedtime. Continue moist heat. Reviewed extension stretches. Consider physical therapy 2 weeks if no better

## 2010-05-14 ENCOUNTER — Other Ambulatory Visit: Payer: Self-pay | Admitting: Family Medicine

## 2010-06-01 LAB — BASIC METABOLIC PANEL
CO2: 27 mEq/L (ref 19–32)
Calcium: 9.4 mg/dL (ref 8.4–10.5)
Creatinine, Ser: 0.61 mg/dL (ref 0.4–1.2)
Glucose, Bld: 125 mg/dL — ABNORMAL HIGH (ref 70–99)

## 2010-06-01 LAB — CBC
MCH: 28.5 pg (ref 26.0–34.0)
MCHC: 33.8 g/dL (ref 30.0–36.0)
Platelets: 235 10*3/uL (ref 150–400)

## 2010-06-01 LAB — HCG, SERUM, QUALITATIVE: Preg, Serum: NEGATIVE

## 2010-06-18 ENCOUNTER — Encounter: Payer: Self-pay | Admitting: Family Medicine

## 2010-06-24 ENCOUNTER — Telehealth: Payer: Self-pay | Admitting: *Deleted

## 2010-06-24 ENCOUNTER — Ambulatory Visit: Payer: Self-pay | Admitting: Family Medicine

## 2010-06-24 NOTE — Telephone Encounter (Signed)
No show for appt today, forgot, spring break etc.  Will reschedule

## 2010-06-26 ENCOUNTER — Encounter: Payer: Self-pay | Admitting: Family Medicine

## 2010-06-26 ENCOUNTER — Ambulatory Visit (INDEPENDENT_AMBULATORY_CARE_PROVIDER_SITE_OTHER): Payer: BC Managed Care – PPO | Admitting: Family Medicine

## 2010-06-26 DIAGNOSIS — E119 Type 2 diabetes mellitus without complications: Secondary | ICD-10-CM

## 2010-06-26 DIAGNOSIS — I10 Essential (primary) hypertension: Secondary | ICD-10-CM

## 2010-06-26 LAB — HEMOGLOBIN A1C: Hgb A1c MFr Bld: 6.6 % — ABNORMAL HIGH (ref 4.6–6.5)

## 2010-06-26 NOTE — Progress Notes (Signed)
Quick Note:  Pt informed on personally identified VM ______ 

## 2010-06-26 NOTE — Progress Notes (Signed)
  Subjective:    Patient ID: Kristen Meyers, female    DOB: December 10, 1970, 40 y.o.   MRN: [id number]  HPI Patient is here for medical followup. Type 2 diabetes approximately two-year duration. Takes metformin. Not monitoring blood sugars. Due for repeat hemoglobin A1c. No symptoms of hyperglycemia. No history of hypertension but apparently started on low-dose enalapril for renal protection. No side effects.  Mild hyperlipidemia. Lipids checked last fall. Takes Zocor 10 mg daily. No myalgias. Exercises mostly with tennis. Nonsmoker   Review of Systems  Constitutional: Negative for appetite change and unexpected weight change.  Respiratory: Negative for cough and shortness of breath.   Cardiovascular: Negative for chest pain, palpitations and leg swelling.  Gastrointestinal: Negative for abdominal pain.  Genitourinary: Negative for dysuria.       Objective:   Physical Exam  Constitutional: She is oriented to person, place, and time. She appears well-developed and well-nourished. No distress.  HENT:  Head: Normocephalic and atraumatic.  Right Ear: External ear normal.  Left Ear: External ear normal.  Eyes: Pupils are equal, round, and reactive to light.  Neck: Neck supple.  Cardiovascular: Normal rate, regular rhythm and normal heart sounds.   No murmur heard. Pulmonary/Chest: Effort normal and breath sounds normal. No respiratory distress. She has no wheezes. She has no rales.  Musculoskeletal: She exhibits no edema.  Lymphadenopathy:    She has no cervical adenopathy.  Neurological: She is alert and oriented to person, place, and time. No cranial nerve deficit.  Psychiatric: She has a normal mood and affect.          Assessment & Plan:  #1 type 2 diabetes. History of good control. Recheck A1c. Discussed exercise and weight loss #2 hyperlipidemia. We'll plan to check lipids in the fall in approximately 6 months

## 2010-07-22 ENCOUNTER — Encounter: Payer: Self-pay | Admitting: Family Medicine

## 2010-07-22 ENCOUNTER — Ambulatory Visit (INDEPENDENT_AMBULATORY_CARE_PROVIDER_SITE_OTHER): Payer: BC Managed Care – PPO | Admitting: Family Medicine

## 2010-07-22 VITALS — BP 120/80 | Temp 98.6°F | Wt 222.0 lb

## 2010-07-22 DIAGNOSIS — M7918 Myalgia, other site: Secondary | ICD-10-CM

## 2010-07-22 DIAGNOSIS — H6090 Unspecified otitis externa, unspecified ear: Secondary | ICD-10-CM

## 2010-07-22 DIAGNOSIS — IMO0001 Reserved for inherently not codable concepts without codable children: Secondary | ICD-10-CM

## 2010-07-22 DIAGNOSIS — H60399 Other infective otitis externa, unspecified ear: Secondary | ICD-10-CM

## 2010-07-22 MED ORDER — NEOMYCIN-POLYMYXIN-HC 3.5-10000-1 OT SUSP: 3.0000 [drp] | Freq: Four times a day (QID) | OTIC | Status: AC

## 2010-07-22 NOTE — Progress Notes (Signed)
  Subjective:    Patient ID: Kristen Meyers, female    DOB: 12-11-1970, 40 y.o.   MRN: [id number]  HPI Patient seen for the following issues  Left ear pain for approximately 2 weeks. Has had some soreness but no drainage. No hearing changes. No recent swimming. No alleviating factors.  Symptoms of mild to moderate severity.  Pain mostly right buttock region. Onset 3 weeks ago. First noted after exercising. Pain mostly with movement and change of position. Still able to exercise past couple weeks. No low back pain. Denies any weakness or numbness. No incontinence symptoms. Tried Flexeril and diclofenac without relief.  Type 2 diabetes has been controlled.  No hx of neuropathy issues.   Review of Systems  HENT: Positive for ear pain. Negative for hearing loss, congestion, tinnitus and ear discharge.   Neurological: Negative for dizziness and weakness.  Hematological: Negative for adenopathy. Does not bruise/bleed easily.       Objective:   Physical Exam  Constitutional: She appears well-developed and well-nourished.  HENT:  Head: Normocephalic and atraumatic.  Right Ear: External ear normal.  Mouth/Throat: Oropharynx is clear and moist. No oropharyngeal exudate.       Left ear canal reveals moderate erythema. No exudate. Minimal cerumen. Eardrum appears normal  Cardiovascular: Normal rate, regular rhythm and normal heart sounds.   No murmur heard. Pulmonary/Chest: Effort normal and breath sounds normal. No respiratory distress. She has no wheezes. She has no rales.  Musculoskeletal:       No edema right lower extremity. Full range of motion knee and hip. Minimal pain with knee flexion against resistance. No pain with knee extension. No pain with hip extension  Neurological:       2+ DP reflexes knee and ankle bilaterally. Posterior is a plantar flexor dorsiflexor bilaterally. Normal sensory function both feet          Assessment & Plan:  #1 left otitis externa. Keep ears  dry. Cortisporin Otic suspension 3 drops left ear 4 times daily #2 left buttock pain. Suspect muscular strain most likely. Continue diclofenac. Recommended gentle stretching and try resume aerobic activity as tolerated. Consider physical therapy if no better in a week

## 2010-07-22 NOTE — Patient Instructions (Signed)
Follow up promptly for any numbness or weakness.

## 2010-08-02 NOTE — Op Note (Signed)
Kristen Meyers, Kristen Meyers              ACCOUNT NO.:  [id number]   MEDICAL RECORD NO.:  [id number]          PATIENT TYPE:  AMB   LOCATION:  SDC                           FACILITY:  [facility]   PHYSICIAN:  Guy Sandifer. Henderson Cloud, M.D. DATE OF BIRTH:  1970-04-08   DATE OF PROCEDURE:  12/02/2004  DATE OF DISCHARGE:                                 OPERATIVE REPORT   PREOPERATIVE DIAGNOSIS:  Incomplete abortion.   POSTOPERATIVE DIAGNOSIS:  Incomplete abortion.   OPERATION/PROCEDURE:  1.  Dilation and evacuation.  2.  1% Xylocaine paracervical block.   SURGEON:  Guy Sandifer. Henderson Cloud, M.D.   ANESTHESIA:  MAC.   ESTIMATED BLOOD LOSS:  50 mL.   SPECIMENS:  Products of conception.   INDICATIONS AND CONSENT:  This patient is a 40 year old married, white  female, G3, P1 with an LMP of September 24, 2004 who complains of bleeding on and  off for the past two days.  Heavy bleeding with clots and cramping today.  Ultrasound in the office revealed clot or tissue in the lower uterine  segment with no evidence of intrauterine pregnancy.  Adnexa were without  free fluid or masses.  On examination cervix is closed to fingertip and  there is a moderate amount of blood in the vagina.  Uterus is about six  weeks in size.  Diagnosis of incomplete abortion  is made.  Recommendation  for dilation and evacuation is made.  Potential risks and complications are  reviewed preoperatively including but not limited to infection, uterine  perforation, organ damage, bleeding requiring transfusion of blood products,  possible transfusion reaction, HIV and hepatitis infection, laparotomy,  laparoscopy, DVT, PE, pneumonia, intrauterine synechia formation, and  secondary infertility and hysterectomy.  All questions answered and consent  is signed on the chart.   DESCRIPTION OF PROCEDURE:  The patient is taken to the operating room where  she is identified, placed in the dorsal supine position and given  intravenous anesthetic.  She was  then placed in the dorsal lithotomy  position where she was gently prepped.  Bladder was straight catheterized  and she was draped in the sterile fashion.  Examination revealed the uterus  to be six to eight weeks in size.  Bivalve speculum was placed in the vagina  and the anterior cervical lip was injected with 1% Xylocaine and grasped  with a single-tooth tenaculum.  Paracervical block was 1% plain Xylocaine is  placed at the 2, 4, 5, 7, 8, and 10 o'clock positions with approximately 20  mL total of 1 Xylocaine plain.  Cervix is essentially already dilated and a  29 dilator passes without difficulty.  A #7 curved curet is passed and  suction curettage is carried out for products of conception.  Twenty units  of Pitocin to 1 L of IV fluid is started  after initial pass of the suction curet.  Alternating sharp and suction  curettage was carried out until the cavity was cleaned.  Good hemostasis was  noted.  Instruments are removed.  All counts are correct.  The patient is  transferred to the recovery room in  stable condition.  Blood type is B  positive.      Guy Sandifer Henderson Cloud, M.D.  Electronically Signed     JET/MEDQ  D:  12/02/2004  T:  12/02/2004  Job:  [id number]

## 2010-08-02 NOTE — Discharge Summary (Signed)
Kristen Meyers, Kristen Meyers                        ACCOUNT NO.:  [id number]   MEDICAL RECORD NO.:  [id number]                   PATIENT TYPE:  INP   LOCATION:  9148                                 FACILITY:  [facility]   PHYSICIAN:  Duke Salvia. Marcelle Overlie, M.D.            DATE OF BIRTH:  05-06-1970   DATE OF ADMISSION:  03/23/2003  DATE OF DISCHARGE:  03/26/2003                                 DISCHARGE SUMMARY   ADMITTING DIAGNOSES:  1. Intrauterine pregnancy at 84 weeks estimated gestational age.  2. History of a previous cesarean delivery.  3. History of previous abruption with stillborn.  4. Mature fetal lung maturity.  5. History of maternal deep vein thrombosis.  6. Gestational diabetes.   DISCHARGE DIAGNOSES:  1. Status post low transverse cesarean section.  2. Viable female infant.   PROCEDURE:  Repeat low transverse cesarean section.   REASON FOR ADMISSION:  Please see dictated H&P.   HOSPITAL COURSE:  The patient is a 40 year old, gravida 2, para 1, who was  admitted to [facility] for a scheduled repeat cesarean  section.  The patient had a complicated medical history whereby she had  experienced a placental abruption and stillborn in 2003, and was delivered  by emergent cesarean section.  The patient underwent testing for abruption,  which showed no obvious cause of placental abruption.  She did subsequently  develop a deep vein thrombosis which was treated 2 weeks postpartum again in  2003.  The patient underwent amniocentesis on March 21, 2003, which had  revealed mature fetal lung maturity.  She was now admitted for repeat  cesarean section.  On the morning of admission, the patient was taken to the  operating room where spinal anesthesia was administered without difficulty.  A low transverse incision was made, with the delivery of a viable female  infant weighing 5 pounds, 15 ounces, Apgars of 8 at 1 minute, 9 at 5  minutes.  The patient tolerated the  procedure well, and was taken to the  recovery room in stable condition.   On postoperative day #1, vital signs were stable, she was afebrile, abdomen  was soft with good return of bowel function.  Abdominal dressing was noted  with a small amount of drainage noted on the bandage.  Fundus was firm and  nontender.  Lungs were clear to auscultation.  Laboratories revealed  hemoglobin of 10.8, platelet count of 152,000, WBC's of 9.3.  The patient  was started on anticoagulant therapy that evening.   On postoperative day #2, vital signs were stable.  Abdominal dressing had  been removed, revealing an incision that was clean, dry, and intact.  The  patient was ambulating well, and she was tolerating her regular diet without  complaints of nausea or vomiting.  Laboratories revealed a fasting blood  sugar of 98, postprandials 98-108.   On postoperative day #3, vital signs were stable, she was afebrile, and  incision was clean, dry, and intact.  Staples were removed, and the patient  was discharged home.   CONDITION ON DISCHARGE:  Good.   DIET:  Regular as tolerated.   ACTIVITY:  No heavy lifting, no driving x2 weeks, no vaginal entry.   DISCHARGE INSTRUCTIONS:  She is to call for temperature greater than 100  degrees, persistent nausea or vomiting, heavy vaginal bleeding and/or  redness or drainage from the incisional site.   FOLLOW UP:  The patient is also to follow up with the hematologist this week  in [facility].   DISCHARGE MEDICATIONS:  1. Lovenox 40 mg subcutaneous q.a.m.  2. Percocet 5/325, #30, one p.o. q.4-6h.  3. Prenatal vitamins one p.o. daily.  4. Colace one p.o. daily p.r.n.     Julio Sicks, N.P.                        Richard M. Marcelle Overlie, M.D.    CC/MEDQ  D:  05/02/2003  T:  05/02/2003  Job:  [id number]

## 2010-08-02 NOTE — H&P (Signed)
NAMEDEMIANA, CRUMBLEY                          ACCOUNT NO.:  [id number]   MEDICAL RECORD NO.:  [id number]                   PATIENT TYPE:   LOCATION:                                       FACILITY:   PHYSICIAN:  Tracie Harrier, M.D.              DATE OF BIRTH:   DATE OF ADMISSION:  03/23/2003  DATE OF DISCHARGE:                                HISTORY & PHYSICAL   Kristen Meyers is a 40 year old female gravida 2, para 1, living 0 at 36-0/7  weeks admitted for repeat cesarean section.  The patient has a complicated  medical history whereby she experienced a placental abruption and stillborn  in 2003, which was delivered by emergent cesarean section.  She underwent  testing for abruption which showed no obvious cause of placental abruption.  She subsequently developed a deep venous thrombosis which was treated two  weeks postpartum again in 2003.   On March 20, 2002, she underwent amniocentesis which showed a mature LS of  fetal lung maturity of 3.2 to 1 with positive PG.  She is admitted for  repeat cesarean section at term and mature lung status of fetus.  She will  undergo anticoagulation prophylactically after delivery.   Also, her pregnancy was complicated by gestational diabetes which initially  required insulin.  Her sugars lately, about the last week, have been normal,  requiring no insulin.  She has had good control of her gestational diabetes.   OB LABORATORY DATA:  Maternal blood type B positive, rubella immune, group B  strep unknown, three hour glucose tolerance test elevated, consistent with  gestational diabetes.   PAST MEDICAL HISTORY:  1. History of seizures - resolved.  2. History of deep venous thrombosis  - normal work-up.   PAST SURGICAL HISTORY:  Cesarean section 2003.   CURRENT MEDICATION:  Prenatal vitamins.   ALLERGIES:  CODEINE.   PHYSICAL EXAMINATION:  GENERAL APPEARANCE:  She is a well-developed, well-  nourished gravid female in no acute  distress.  VITAL SIGNS:  Stable.  Blood pressure 126/70, fetal heart tones 140s and  reactive (on March 22, 2003).  HEENT:  Within normal limits.  NECK:  Supple without adenopathy or thyromegaly.  LUNGS:  Clear.  CARDIOVASCULAR:  Regular rate and rhythm without murmurs, rubs, or gallops.  BREASTS:  Examination deferred.  ABDOMEN:  Gravid and nontender with a fundal height of 37 cm.  EXTREMITIES:  Grossly normal.  NEUROLOGIC:  Grossly normal.  PELVIC:  Examination is deferred.   ADMISSION DIAGNOSES:  1. Intrauterine pregnancy at 36-0/7 weeks.  2. Mature fetal pulmonary lung status.  3. Repeat cesarean section.  4. History of placental abruption with stillborn.  5. History of deep venous thrombosis.   PLAN:  1. Repeat low transverse cesarean section.  2. Anticoagulation prophylaxis after delivery.   DISCUSSION:  The risks and benefits of this procedure were reviewed with the  patient.  Also the need  for surgery and anticoagulation reviewed with the  patient and her husband.                                               Tracie Harrier, M.D.    REG/MEDQ  D:  03/22/2003  T:  03/22/2003  Job:  [id number]

## 2010-08-29 ENCOUNTER — Other Ambulatory Visit: Payer: Self-pay | Admitting: Family Medicine

## 2010-09-23 ENCOUNTER — Other Ambulatory Visit: Payer: Self-pay | Admitting: Family Medicine

## 2010-11-25 ENCOUNTER — Other Ambulatory Visit: Payer: Self-pay | Admitting: Family Medicine

## 2010-12-26 ENCOUNTER — Ambulatory Visit (INDEPENDENT_AMBULATORY_CARE_PROVIDER_SITE_OTHER): Payer: BC Managed Care – PPO | Admitting: Family Medicine

## 2010-12-26 ENCOUNTER — Encounter: Payer: Self-pay | Admitting: Family Medicine

## 2010-12-26 DIAGNOSIS — I10 Essential (primary) hypertension: Secondary | ICD-10-CM

## 2010-12-26 DIAGNOSIS — E119 Type 2 diabetes mellitus without complications: Secondary | ICD-10-CM

## 2010-12-26 DIAGNOSIS — E785 Hyperlipidemia, unspecified: Secondary | ICD-10-CM

## 2010-12-26 LAB — HEPATIC FUNCTION PANEL
ALT: 33 U/L (ref 0–35)
AST: 28 U/L (ref 0–37)
Albumin: 4.3 g/dL (ref 3.5–5.2)

## 2010-12-26 LAB — BASIC METABOLIC PANEL
BUN: 21 mg/dL (ref 6–23)
CO2: 24 mEq/L (ref 19–32)
Calcium: 9.3 mg/dL (ref 8.4–10.5)
Chloride: 109 mEq/L (ref 96–112)
Creatinine, Ser: 0.7 mg/dL (ref 0.4–1.2)
GFR: 91.48 mL/min (ref >60.00)
Glucose, Bld: 126 mg/dL — ABNORMAL HIGH (ref 70–99)
Potassium: 4.9 mEq/L (ref 3.5–5.1)
Sodium: 142 mEq/L (ref 135–145)

## 2010-12-26 LAB — LIPID PANEL
HDL: 47 mg/dL (ref >39.00)
Total CHOL/HDL Ratio: 4
Triglycerides: 96 mg/dL (ref 0.0–149.0)

## 2010-12-26 LAB — HEMOGLOBIN A1C: Hgb A1c MFr Bld: 6.9 % — ABNORMAL HIGH (ref 4.6–6.5)

## 2010-12-26 NOTE — Progress Notes (Signed)
  Subjective:    Patient ID: Kristen Meyers, female    DOB: 1970/12/27, 40 y.o.   MRN: [id number]  HPI Patient seen for medical followup. Type 2 diabetes which has been well controlled. She also has history of hypertension and hyperlipidemia. Medications reviewed and compliant with all. Not monitoring blood sugars. No symptoms of hyperglycemia. She continues with yearly eye exams. No history of diabetes complications. Exercising 2 times per week. No orthostatic symptoms. Denies cough or other side effects from lisinopril. Patient takes simvastatin 20 mg for hyperlipidemia. No myalgias. Nonsmoker.  Past Medical History  Diagnosis Date  . DIAB W/O COMP TYPE II/UNS NOT STATED UNCNTRL 03/21/2010  . HYPERLIPIDEMIA 03/21/2010  . HYPERTENSION 03/21/2010  . GERD 03/21/2010   Past Surgical History  Procedure Date  . Cesarean section     2003, 2005  . Tubal ligation 2011    reports that she has never smoked. She does not have any smokeless tobacco history on file. Her alcohol and drug histories not on file. family history includes Cancer in her mother; Diabetes in her mother; Heart disease in her mother; Hyperlipidemia in her mother; and Hypertension in her mother. Allergies  Allergen Reactions  . Codeine Rash      Review of Systems  Constitutional: Negative for fever, activity change, appetite change, fatigue and unexpected weight change.  Eyes: Negative for visual disturbance.  Respiratory: Negative for cough, shortness of breath and wheezing.   Cardiovascular: Negative for chest pain, palpitations and leg swelling.  Gastrointestinal: Negative for abdominal pain.  Genitourinary: Negative for dysuria.  Neurological: Negative for headaches.       Objective:   Physical Exam  Constitutional: She appears well-developed and well-nourished.  HENT:  Right Ear: External ear normal.  Left Ear: External ear normal.  Mouth/Throat: Oropharynx is clear and moist.  Neck: Neck supple.    Cardiovascular: Normal rate and regular rhythm.   Pulmonary/Chest: Effort normal and breath sounds normal. No respiratory distress. She has no wheezes. She has no rales.  Musculoskeletal: She exhibits no edema.       Feet reveal no skin lesions. Good distal foot pulses. Good capillary refill. No calluses. Normal sensation with monofilament testing   Lymphadenopathy:    She has no cervical adenopathy.  Psychiatric: She has a normal mood and affect. Her behavior is normal.          Assessment & Plan:  #1 type 2 diabetes. History of good control. Recheck A1c. Continue metformin. Continue regular exercise and weight control #2 hyperlipidemia. Check lipids and hepatic panel  #3 hypertension stable at goal. Check basic metabolic panel

## 2010-12-27 NOTE — Progress Notes (Signed)
Quick Note:  Pt informed ______ 

## 2011-04-24 ENCOUNTER — Encounter (HOSPITAL_BASED_OUTPATIENT_CLINIC_OR_DEPARTMENT_OTHER): Payer: Self-pay

## 2011-04-24 ENCOUNTER — Emergency Department (HOSPITAL_BASED_OUTPATIENT_CLINIC_OR_DEPARTMENT_OTHER)
Admission: EM | Admit: 2011-04-24 | Discharge: 2011-04-24 | Disposition: A | Discharge status: Home / Self Care | Payer: BC Managed Care – PPO | Attending: Emergency Medicine | Admitting: Emergency Medicine

## 2011-04-24 DIAGNOSIS — R5381 Other malaise: Secondary | ICD-10-CM | POA: Insufficient documentation

## 2011-04-24 DIAGNOSIS — Z79899 Other long term (current) drug therapy: Secondary | ICD-10-CM | POA: Insufficient documentation

## 2011-04-24 DIAGNOSIS — K219 Gastro-esophageal reflux disease without esophagitis: Secondary | ICD-10-CM | POA: Insufficient documentation

## 2011-04-24 DIAGNOSIS — E785 Hyperlipidemia, unspecified: Secondary | ICD-10-CM | POA: Insufficient documentation

## 2011-04-24 DIAGNOSIS — E119 Type 2 diabetes mellitus without complications: Secondary | ICD-10-CM | POA: Insufficient documentation

## 2011-04-24 DIAGNOSIS — R51 Headache: Secondary | ICD-10-CM | POA: Insufficient documentation

## 2011-04-24 DIAGNOSIS — I10 Essential (primary) hypertension: Secondary | ICD-10-CM | POA: Insufficient documentation

## 2011-04-24 LAB — BASIC METABOLIC PANEL
BUN: 16 mg/dL (ref 6–23)
Calcium: 9.6 mg/dL (ref 8.4–10.5)
GFR calc non Af Amer: >90 mL/min (ref >90)
Glucose, Bld: 147 mg/dL — ABNORMAL HIGH (ref 70–99)

## 2011-04-24 NOTE — ED Notes (Signed)
MD at bedside. 

## 2011-04-24 NOTE — ED Provider Notes (Signed)
History   CSN: [id number]  Arrival date & time 04/24/11  0807   First MD Initiated Contact with Patient 04/24/11 [phone]      Chief Complaint  Patient presents with  . Hyperglycemia    (Consider location/radiation/quality/duration/timing/severity/associated sxs/prior treatment) HPI Patient has been feeling nauseous and lightheaded since Saturday and had 1 day of headache and fatigue on Monday. She was diagnosed with diabetes 2 years ago and has been well managed on metformin alone, previous A1c approximately Nov 2012 was 7.0 with prior value of 6.8. She denies and muscle aches, chills or fevers. Checked her blood sugar this morning and it was 542, rechecked it twice. She doesn't normally check blood sugar and had not checked it before today since feeling unwell. Is currently having period and has some crampy abdominal pain associated with that that is somewhat worse than her usual pain. She had tubal ligation.  Past Medical History  Diagnosis Date  . DIAB W/O COMP TYPE II/UNS NOT STATED UNCNTRL 03/21/2010  . HYPERLIPIDEMIA 03/21/2010  . HYPERTENSION 03/21/2010  . GERD 03/21/2010    Past Surgical History  Procedure Date  . Cesarean section     2003, 2005  . Tubal ligation 2011    Family History  Problem Relation Age of Onset  . Cancer Mother     colon, ovarian, lung  . Heart disease Mother   . Hypertension Mother   . Hyperlipidemia Mother   . Diabetes Mother     type ll    History  Substance Use Topics  . Smoking status: Never Smoker   . Smokeless tobacco: Never Used  . Alcohol Use: No    OB History    Grav Para Term Preterm Abortions TAB SAB Ect Mult Living                  Review of Systems  Allergies  Codeine  Home Medications   Current Outpatient Rx  Name Route Sig Dispense Refill  . CLOBETASOL PROPIONATE 0.05 % EX FOAM Topical Apply 1 application topically as needed. For sensitive sclap    . ENALAPRIL MALEATE 2.5 MG PO TABS      . OMEGA-3 FATTY ACIDS 1000 MG  PO CAPS Oral Take 1 g by mouth daily.    . IBUPROFEN 200 MG PO TABS Oral Take 400 mg by mouth every 6 (six) hours as needed. For pain    . METFORMIN HCL ER 500 MG PO TB24      . MULTI-VITAMIN/MINERALS PO TABS Oral Take 1 tablet by mouth daily.    Marland Kitchen NAPROXEN SODIUM 220 MG PO TABS Oral Take 220 mg by mouth 2 (two) times daily with a meal. For headache    . SIMVASTATIN 20 MG PO TABS        BP 157/92  Pulse 94  Temp(Src) 98.6 F (37 C) (Oral)  Resp 20  Ht 5\' 3"  (1.6 m)  Wt 225 lb (102.059 kg)  BMI 39.86 kg/m2  SpO2 100%  LMP 04/24/2011  Physical Exam General: pleasant obese woman resting in bed in no apparent distress HEENT: PERRL, EOMI, no scleral icterus Cardiac: RRR, no rubs, murmurs or gallops Pulm: clear to auscultation bilaterally, moving normal volumes of air Abd: soft, nontender, nondistended, BS present Ext: warm and well perfused, no pedal edema Neuro: alert and oriented X3, cranial nerves II-XII grossly intact  ED Course  Procedures (including critical care time)  Labs Reviewed  GLUCOSE, CAPILLARY - Abnormal; Notable for the following:  Glucose-Capillary 142 (*)    All other components within normal limits  BASIC METABOLIC PANEL  PREGNANCY, URINE   No results found.   No diagnosis found.  MDM  Patient's glucometer is likely not functioning properly. Will recheck her blood sugar via BMET and also check urine pregnancy test and send her home with new prescription for glucometer. Will advise her to take PPI or H2 blocker as needed for symptoms of heartburn/GERD which may be contributing to nausea and reduce use of ibuprofen/naproxen.        Margorie John, MD 04/24/11 [phone]

## 2011-04-24 NOTE — ED Provider Notes (Signed)
I saw and evaluated the patient, reviewed the resident's note and I agree with the findings and plan.  Pt has had some nausea for the last week.  She checked her blood sugar this morning it was in the 500s. Other than some nausea this past week she's had no other symptoms. She denies fevers or chills. No vomiting or diarrhea  On exam vital signs are stable patient is alert comfortable in no distress.  Blood sugar that we obtained here in emergent sodium mildly elevated at 160. We will recheck with a serum glucose as well as urine pregnancy test.  If the results are similar we will have the patient have her blood glucose monitor checked.  Celene Kras, MD 04/24/11 [phone]

## 2011-04-24 NOTE — ED Notes (Signed)
Pt reports elevated blood sugar this morning of 564.  She has felt weak and nauseated x 1 week.

## 2011-04-24 NOTE — ED Notes (Signed)
Secondary Assessment- Pt reports she has had generalized weakness, nausea, abdominal cramps and not feeling well x 1 week.  Pt states she checked her glucose this am and it was 564.  Pt has no ither complaints at this time.  Skin warm and dry, lungs CTA throughout.

## 2011-06-16 ENCOUNTER — Other Ambulatory Visit: Payer: Self-pay | Admitting: *Deleted

## 2011-06-16 MED ORDER — ENALAPRIL MALEATE 2.5 MG PO TABS: 2.5000 mg | ORAL_TABLET | Freq: Every day | ORAL | Status: DC

## 2011-06-16 MED ORDER — SIMVASTATIN 20 MG PO TABS: 20.0000 mg | ORAL_TABLET | Freq: Every day | ORAL | Status: DC

## 2011-06-16 MED ORDER — METFORMIN HCL ER 500 MG PO TB24: 500.0000 mg | ORAL_TABLET | Freq: Two times a day (BID) | ORAL | Status: DC

## 2011-06-26 ENCOUNTER — Ambulatory Visit (INDEPENDENT_AMBULATORY_CARE_PROVIDER_SITE_OTHER): Payer: BC Managed Care – PPO | Admitting: Family Medicine

## 2011-06-26 ENCOUNTER — Encounter: Payer: Self-pay | Admitting: Family Medicine

## 2011-06-26 VITALS — BP 130/90 | Temp 98.4°F | Wt 228.0 lb

## 2011-06-26 DIAGNOSIS — E119 Type 2 diabetes mellitus without complications: Secondary | ICD-10-CM

## 2011-06-26 NOTE — Progress Notes (Signed)
  Subjective:    Patient ID: Kristen Meyers, female    DOB: 1970/05/16, 41 y.o.   MRN: [id number]  HPI  Patient here for followup 2 diabetes. Diabetes been fairly well controlled. Continues exercise most days a week mostly with tennis. She remains on metformin. Also takes low-dose Vasotec 2.5 mg daily. Lipids well controlled. No neuropathy symptoms. Getting regular eye exams. She's not sure of date of last exam but has been less than one year.  Past Medical History  Diagnosis Date  . DIAB W/O COMP TYPE II/UNS NOT STATED UNCNTRL 03/21/2010  . HYPERLIPIDEMIA 03/21/2010  . HYPERTENSION 03/21/2010  . GERD 03/21/2010   Past Surgical History  Procedure Date  . Cesarean section     2003, 2005  . Tubal ligation 2011    reports that she has never smoked. She has never used smokeless tobacco. She reports that she does not drink alcohol or use illicit drugs. family history includes Cancer in her mother; Diabetes in her mother; Heart disease in her mother; Hyperlipidemia in her mother; and Hypertension in her mother. Allergies  Allergen Reactions  . Codeine Hives      Review of Systems  Constitutional: Negative for fatigue.  Eyes: Negative for visual disturbance.  Respiratory: Negative for cough, chest tightness, shortness of breath and wheezing.   Cardiovascular: Negative for chest pain, palpitations and leg swelling.  Genitourinary: Negative for frequency.  Neurological: Negative for dizziness, seizures, syncope, weakness, light-headedness and headaches.       Objective:   Physical Exam  Constitutional: She is oriented to person, place, and time. She appears well-developed and well-nourished.  Neck: Neck supple. No thyromegaly present.  Cardiovascular: Normal rate and regular rhythm.   Pulmonary/Chest: Effort normal and breath sounds normal. No respiratory distress. She has no wheezes. She has no rales.  Musculoskeletal: She exhibits no edema.       Feet reveal no skin lesions. Good  distal foot pulses. Good capillary refill. No calluses. Normal sensation with monofilament testing   Neurological: She is alert and oriented to person, place, and time.          Assessment & Plan:  Type 2 diabetes. History of good control. Recheck A1c. Reminder for yearly eye exam. Repeat urine microalbumin at next followup

## 2011-06-30 NOTE — Progress Notes (Signed)
Quick Note:  Pt informed on VM, home ______

## 2011-11-26 ENCOUNTER — Encounter: Payer: Self-pay | Admitting: Family Medicine

## 2011-11-26 ENCOUNTER — Ambulatory Visit (INDEPENDENT_AMBULATORY_CARE_PROVIDER_SITE_OTHER): Payer: BC Managed Care – PPO | Admitting: Family Medicine

## 2011-11-26 VITALS — BP 130/90 | Temp 98.0°F | Wt 232.0 lb

## 2011-11-26 DIAGNOSIS — J019 Acute sinusitis, unspecified: Secondary | ICD-10-CM

## 2011-11-26 DIAGNOSIS — H6120 Impacted cerumen, unspecified ear: Secondary | ICD-10-CM

## 2011-11-26 MED ORDER — AMOXICILLIN 875 MG PO TABS: 875.0000 mg | ORAL_TABLET | Freq: Two times a day (BID) | ORAL | Status: AC

## 2011-11-26 MED ORDER — METFORMIN HCL ER 500 MG PO TB24: 500.0000 mg | ORAL_TABLET | Freq: Two times a day (BID) | ORAL | Status: DC

## 2011-11-26 NOTE — Patient Instructions (Addendum)

## 2011-11-26 NOTE — Progress Notes (Signed)
  Subjective:    Patient ID: Kristen Meyers, female    DOB: March 07, 1971, 41 y.o.   MRN: [id number]  HPI  Acute visit. Possible sinus infection. 4 to five-day history of sinus congestion. Dark yellow mucus past few days. Right ear fullness. Cough and sore throat. Took Allegra without relief. No fever or chills. Intermittent mild headache. No nausea or vomiting. Diabetes been stable   Review of Systems  Constitutional: Negative for fever and chills.  HENT: Positive for hearing loss (right ear), congestion, sore throat and sinus pressure.   Respiratory: Negative for cough.        Objective:   Physical Exam  Constitutional: She appears well-developed and well-nourished.  HENT:  Mouth/Throat: Oropharynx is clear and moist.       Left eardrum and canal normal. Right ear cerumen impaction  Neck: Neck supple. No thyromegaly present.  Cardiovascular: Normal rate and regular rhythm.   Pulmonary/Chest: Effort normal and breath sounds normal. No respiratory distress. She has no wheezes. She has no rales.  Lymphadenopathy:    She has no cervical adenopathy.          Assessment & Plan:  #1 acute sinusitis. Explained probably viral. Observe and symptomatic treatment for now. If symptoms persist or worsen start amoxicillin 875 mg twice daily for 10 days  #2 cerumen impaction right ear. Irrigation with improvement

## 2011-12-26 ENCOUNTER — Encounter: Payer: Self-pay | Admitting: Family Medicine

## 2011-12-26 ENCOUNTER — Ambulatory Visit (INDEPENDENT_AMBULATORY_CARE_PROVIDER_SITE_OTHER): Payer: BC Managed Care – PPO | Admitting: Family Medicine

## 2011-12-26 VITALS — BP 120/84 | Temp 98.1°F | Wt 231.0 lb

## 2011-12-26 DIAGNOSIS — I10 Essential (primary) hypertension: Secondary | ICD-10-CM

## 2011-12-26 DIAGNOSIS — E785 Hyperlipidemia, unspecified: Secondary | ICD-10-CM

## 2011-12-26 DIAGNOSIS — E119 Type 2 diabetes mellitus without complications: Secondary | ICD-10-CM

## 2011-12-26 LAB — HEPATIC FUNCTION PANEL
ALT: 43 U/L — ABNORMAL HIGH (ref 0–35)
AST: 41 U/L — ABNORMAL HIGH (ref 0–37)
Albumin: 3.8 g/dL (ref 3.5–5.2)
Alkaline Phosphatase: 72 U/L (ref 39–117)
Bilirubin, Direct: 0 mg/dL (ref 0.0–0.3)
Total Bilirubin: 0.4 mg/dL (ref 0.3–1.2)

## 2011-12-26 LAB — LIPID PANEL
LDL Cholesterol: 110 mg/dL — ABNORMAL HIGH (ref 0–99)
VLDL: 20.4 mg/dL (ref 0.0–40.0)

## 2011-12-26 LAB — HEMOGLOBIN A1C: Hgb A1c MFr Bld: 7.6 % — ABNORMAL HIGH (ref 4.6–6.5)

## 2011-12-26 NOTE — Progress Notes (Signed)
  Subjective:    Patient ID: Kristen Meyers, female    DOB: 10-04-1970, 41 y.o.   MRN: [id number]  HPI  Medical followup. Patient has type 2 diabetes, hypertension, and hyperlipidemia. Medications reviewed. Compliant with all. No side effects. Patient does not monitor blood sugars. No symptoms of hyperglycemia. Last hemoglobin A1c 7.0%. Needs followup eye exam. No symptoms of neuropathy. Patient declines flu vaccine. Not exercising consistently. She denies any chest pains, blurred vision, shortness of breath, or any peripheral edema issues.  Reports recent mild anemia per gynecologist. She's had some heavy menses. Anemia apparently corrected with iron replacement. No orthostasis. No stool changes.  Past Medical History  Diagnosis Date  . DIAB W/O COMP TYPE II/UNS NOT STATED UNCNTRL 03/21/2010  . HYPERLIPIDEMIA 03/21/2010  . HYPERTENSION 03/21/2010  . GERD 03/21/2010   Past Surgical History  Procedure Date  . Cesarean section     2003, 2005  . Tubal ligation 2011    reports that she has never smoked. She has never used smokeless tobacco. She reports that she does not drink alcohol or use illicit drugs. family history includes Cancer in her mother; Diabetes in her mother; Heart disease in her mother; Hyperlipidemia in her mother; and Hypertension in her mother. Allergies  Allergen Reactions  . Codeine Hives      Review of Systems  Constitutional: Negative for fatigue.  Eyes: Negative for visual disturbance.  Respiratory: Negative for cough, chest tightness, shortness of breath and wheezing.   Cardiovascular: Negative for chest pain, palpitations and leg swelling.  Gastrointestinal: Negative for abdominal pain.  Neurological: Negative for dizziness, seizures, syncope, weakness, light-headedness and headaches.       Objective:   Physical Exam  Constitutional: She is oriented to person, place, and time. She appears well-developed and well-nourished.  Neck: Neck supple. No thyromegaly  present.  Cardiovascular: Normal rate and regular rhythm.   Pulmonary/Chest: Effort normal and breath sounds normal. No respiratory distress. She has no wheezes. She has no rales.  Musculoskeletal: She exhibits no edema.       Feet reveal no skin lesions. Good distal foot pulses. Good capillary refill. No calluses. Normal sensation with monofilament testing   Neurological: She is alert and oriented to person, place, and time. No cranial nerve deficit.          Assessment & Plan:  #1 type 2 diabetes. History of fairly good control. Recheck A1c. Set up repeat eye exam #2 hypertension. Stable. Continue enalapril #3 hyperlipidemia. Check lipid and hepatic panel  #4 health maintenance. Continue GYN followup. Flu vaccine offered and declined

## 2011-12-29 ENCOUNTER — Other Ambulatory Visit: Payer: Self-pay | Admitting: *Deleted

## 2011-12-29 DIAGNOSIS — E119 Type 2 diabetes mellitus without complications: Secondary | ICD-10-CM

## 2011-12-29 MED ORDER — METFORMIN HCL ER 500 MG PO TB24: ORAL_TABLET | ORAL | Status: DC

## 2011-12-29 NOTE — Progress Notes (Signed)
Quick Note:  Pt informed med refill, order done ______

## 2012-03-17 DIAGNOSIS — Z8619 Personal history of other infectious and parasitic diseases: Secondary | ICD-10-CM

## 2012-03-17 HISTORY — DX: Personal history of other infectious and parasitic diseases: Z86.19

## 2012-03-29 ENCOUNTER — Encounter: Payer: Self-pay | Admitting: Family Medicine

## 2012-03-29 ENCOUNTER — Ambulatory Visit (INDEPENDENT_AMBULATORY_CARE_PROVIDER_SITE_OTHER): Payer: BC Managed Care – PPO | Admitting: Family Medicine

## 2012-03-29 VITALS — BP 151/107 | HR 94 | Temp 98.3°F | Ht 62.75 in | Wt 234.0 lb

## 2012-03-29 DIAGNOSIS — B029 Zoster without complications: Secondary | ICD-10-CM

## 2012-03-29 NOTE — Patient Instructions (Signed)
Consider home blood pressure (automatic, upper arm cuff) machine. Normal bp is <140 on top and <90 on bottom.

## 2012-03-29 NOTE — Assessment & Plan Note (Signed)
This is running its course. She may continue current measures (OTC hydrocortisone and prn NSAID). Discussed the dx, answered pt's questions about it, discussed getting zostavax in the future but wait at least 2-3 mo from the time of active shingles. She reports that she planned on routine f/u in April this year (for DM 2, hyperlipidemia) and this would be good timing to get zostavax. Also, regarding her bp, she says she is on enalapril for renal protective effects and claims she does not have HTN.  She has no home bp cuff. We discussed the advantages of home monitoring and we went over the goal of bp <140/90 consistently.   No med changes made today.

## 2012-03-29 NOTE — Progress Notes (Signed)
OFFICE NOTE  03/29/2012  CC:  Chief Complaint  Patient presents with  . Establish Care    rash on left lower back wrapping around to stomach x 3 weeks; blisters and itching; blisters have begun to dry up     HPI: Patient is a 42 y.o. Caucasian female who is here for rash.  She is transferring her care from Dr. Caryl Never at the [place] office to here b/c of convenience to her home.  About 3 wks ago she had onset of itching in left back, flank and left mid abd itching and sensitivity to touch, followed three days later by reddish blisters.  These were distributed along dermatomal distribution per her description.  She had mild upset stomach and "muscle ache" feeling in her back and that has all resolved.  The blisters broke and dried up but she still has sensitivity to touch and itching in the region.  Question of similar rash in same area in the past but pt is a bit hazy on the details of this currently.  She has never been dx'd with shingles before today. Has tried ibuprofen and cortisone OTC cream and these have helped some for brief periods.  Pertinent PMH:  Past Medical History  Diagnosis Date  . DIAB W/O COMP TYPE II/UNS NOT STATED UNCNTRL 03/21/2010  . HYPERLIPIDEMIA 03/21/2010  . HYPERTENSION 03/21/2010  . GERD 03/21/2010  Hx of DVT  MEDS:  Outpatient Prescriptions Prior to Visit  Medication Sig Dispense Refill  . clobetasol (OLUX) 0.05 % topical foam Apply 1 application topically as needed. For sensitive sclap      . enalapril (VASOTEC) 2.5 MG tablet Take 1 tablet (2.5 mg total) by mouth daily.  90 tablet  3  . ibuprofen (ADVIL,MOTRIN) 200 MG tablet Take 400 mg by mouth every 6 (six) hours as needed. For pain      . Multiple Vitamins-Minerals (MULTIVITAMIN WITH MINERALS) tablet Take 1 tablet by mouth daily.      . simvastatin (ZOCOR) 20 MG tablet Take 1 tablet (20 mg total) by mouth daily.  30 tablet  3  . fish oil-omega-3 fatty acids 1000 MG capsule Take 1 g by mouth daily.      .  [DISCONTINUED] metFORMIN (GLUCOPHAGE-XR) 500 MG 24 hr tablet 2 tabs in the AM, 1 tab in the PM  270 tablet  3   Last reviewed on 03/29/2012  2:04 PM by Jeoffrey Massed, MD  PE: Blood pressure 151/107, pulse 94, temperature 98.3 F (36.8 C), temperature source Temporal, height 5' 2.75" (1.594 m), weight 234 lb (106.142 kg), last menstrual period 03/13/2012, SpO2 97.00%. Gen: Alert, well appearing, obese white female in NAD.  Patient is oriented to person, place, time, and situation. AFFECT: pleasant, lucid thought and speech. SKIN: at about T10 level on the left side of her back and same level on left side of abdomen there are dried, pinkish flat papules.  No vesicles, no pustules.  The flank and side at this level are free of rash.  The skin along T10 distribution on left is hypersensitive to touch--brings a prickly sensation.     IMPRESSION AND PLAN:  Herpes zoster This is running its course. She may continue current measures (OTC hydrocortisone and prn NSAID). Discussed the dx, answered pt's questions about it, discussed getting zostavax in the future but wait at least 2-3 mo from the time of active shingles. She reports that she planned on routine f/u in April this year (for DM 2, hyperlipidemia) and  this would be good timing to get zostavax. Also, regarding her bp, she says she is on enalapril for renal protective effects and claims she does not have HTN.  She has no home bp cuff. We discussed the advantages of home monitoring and we went over the goal of bp <140/90 consistently.   No med changes made today.   An After Visit Summary was printed and given to the patient.  FOLLOW UP: as above

## 2012-03-31 ENCOUNTER — Telehealth: Payer: Self-pay | Admitting: Family Medicine

## 2012-03-31 NOTE — Telephone Encounter (Signed)
Patient's daughter has a confirmed case of lice. Her pediatrician gave her an Rx. The patient states her daughter sleeps with her & she now has them too. Please send in Rx. I advised patient of 48 hour notice.

## 2012-03-31 NOTE — Telephone Encounter (Signed)
Pt was last seen on Monday.  Is this OK without a visit?  Please advise.

## 2012-04-01 MED ORDER — IVERMECTIN 0.5 % EX LOTN: 1.0000 | TOPICAL_LOTION | Freq: Once | CUTANEOUS | Status: DC

## 2012-04-01 NOTE — Telephone Encounter (Signed)
rx printed-thx

## 2012-04-01 NOTE — Telephone Encounter (Signed)
I have attempted to contact this patient by phone with the following results: left message to return my call on answering machine (home).  

## 2012-04-02 NOTE — Telephone Encounter (Signed)
Call to pt.  She was notified by pharmacy RX ready, but has not picked up yet.  She will do this today.

## 2012-04-22 ENCOUNTER — Encounter: Payer: Self-pay | Admitting: Family Medicine

## 2012-05-03 ENCOUNTER — Other Ambulatory Visit: Payer: Self-pay | Admitting: Family Medicine

## 2012-05-11 ENCOUNTER — Other Ambulatory Visit: Payer: Self-pay | Admitting: *Deleted

## 2012-05-11 MED ORDER — SIMVASTATIN 20 MG PO TABS: 20.0000 mg | ORAL_TABLET | Freq: Every day | ORAL | Status: DC

## 2012-05-11 NOTE — Telephone Encounter (Signed)
Faxed refill request received from pharmacy for SIMVASTATIN Last filled by MD on 06/16/11, #30 X 3  Last seen on 03/2012 Follow up 06/25/12 RX sent.

## 2012-06-25 ENCOUNTER — Ambulatory Visit: Payer: Self-pay | Admitting: Family Medicine

## 2012-06-25 ENCOUNTER — Ambulatory Visit: Payer: BC Managed Care – PPO | Admitting: Family Medicine

## 2012-06-29 ENCOUNTER — Other Ambulatory Visit: Payer: Self-pay | Admitting: Family Medicine

## 2012-07-06 ENCOUNTER — Telehealth: Payer: Self-pay | Admitting: *Deleted

## 2012-07-06 MED ORDER — ENALAPRIL MALEATE 2.5 MG PO TABS: 2.5000 mg | ORAL_TABLET | Freq: Every day | ORAL | Status: DC

## 2012-07-06 NOTE — Telephone Encounter (Signed)
Rx request to pharmacy/SLS  

## 2012-07-09 ENCOUNTER — Ambulatory Visit: Payer: Self-pay | Admitting: Family Medicine

## 2012-07-16 ENCOUNTER — Encounter: Payer: Self-pay | Admitting: Family Medicine

## 2012-07-16 ENCOUNTER — Other Ambulatory Visit: Payer: Self-pay | Admitting: Family Medicine

## 2012-07-16 ENCOUNTER — Ambulatory Visit (INDEPENDENT_AMBULATORY_CARE_PROVIDER_SITE_OTHER): Payer: BC Managed Care – PPO | Admitting: Family Medicine

## 2012-07-16 VITALS — BP 149/96 | HR 85 | Temp 98.1°F | Resp 16 | Wt 234.2 lb

## 2012-07-16 DIAGNOSIS — I10 Essential (primary) hypertension: Secondary | ICD-10-CM

## 2012-07-16 DIAGNOSIS — M25549 Pain in joints of unspecified hand: Secondary | ICD-10-CM

## 2012-07-16 LAB — COMPREHENSIVE METABOLIC PANEL
AST: 66 U/L — ABNORMAL HIGH (ref 0–37)
Albumin: 4.1 g/dL (ref 3.5–5.2)
BUN: 15 mg/dL (ref 6–23)
Calcium: 9.5 mg/dL (ref 8.4–10.5)
Chloride: 99 mEq/L (ref 96–112)
Glucose, Bld: 334 mg/dL — ABNORMAL HIGH (ref 70–99)
Potassium: 4.4 mEq/L (ref 3.5–5.3)

## 2012-07-16 NOTE — Progress Notes (Signed)
OFFICE NOTE  07/19/2012  CC:  Chief Complaint  Patient presents with  . Follow-up    3-mth [DM; Hyperlipidemia]  . Hypertension    Pt c/o intermittent H/As     HPI: Patient is a 42 y.o. Caucasian female who is here for f/u DM 2, hyperlipidemia, HTN.   Patient admits right off the bat that she has been approx 50% compliant with her meds, probably even worse with her enalapril. She simply got out of a routine of taking them.  Denies side effects/probs with the meds. No glucose checks from home. No bp checks from home but she does report that she purchased a bp cuff--just hasn't used it yet.  Of note, she denies any pain in the region where she had shingles about 4 mo ago.  Only complaint today is some nagging achiness in fingers/hands lately--says it feels like it is in the joints.  No swelling or redness noted by patient.  Uses her fingers/hands a lot.  Worse towards the end of the day, better with rest.  No meds have been tried.  Pertinent PMH:  Past Medical History  Diagnosis Date  . DIAB W/O COMP TYPE II/UNS NOT STATED UNCNTRL 03/21/2010  . HYPERLIPIDEMIA 03/21/2010  . HYPERTENSION 03/21/2010  . GERD 03/21/2010  . Fatty liver 09/19/09 (u/s)    Mildly elevated transaminases  . Cholelithiasis with obstruction 09/19/09 (u/s)  . DEEP VENOUS THROMBOPHLEBITIS, HX OF 03/21/2010    Qualifier: Diagnosis of  By: Caryl Never MD, Bruce    . History of herpes zoster 03/2012   Past Surgical History  Procedure Laterality Date  . Cesarean section      2003, 2005  . Tubal ligation  2011    MEDS:  Outpatient Prescriptions Prior to Visit  Medication Sig Dispense Refill  . clobetasol (OLUX) 0.05 % topical foam Apply 1 application topically as needed. For sensitive sclap      . enalapril (VASOTEC) 2.5 MG tablet Take 1 tablet (2.5 mg total) by mouth daily.  90 tablet  1  . fish oil-omega-3 fatty acids 1000 MG capsule Take 1 g by mouth daily.      Marland Kitchen ibuprofen (ADVIL,MOTRIN) 200 MG tablet Take 400 mg by  mouth every 6 (six) hours as needed. For pain      . Ivermectin (SKLICE) 0.5 % LOTN Apply 1 Tube topically once. Apply to dry scalp and hair closest to scalp first, then apply outward towards ends of hair; completely covering scalp and hair. Leave on for 10 minutes (start timing treatment after the scalp and hair have been completely covered). The hair should then be rinsed thoroughly with warm water. Avoid contact with the eyes. Nit combing is not required, although a fine-tooth comb may be used to remove treated lice and nits.  1 Tube  0  . metFORMIN (GLUCOPHAGE-XR) 500 MG 24 hr tablet 1 tab in the AM, 2 tabs in the PM      . Multiple Vitamins-Minerals (MULTIVITAMIN WITH MINERALS) tablet Take 1 tablet by mouth daily.      . simvastatin (ZOCOR) 20 MG tablet Take 1 tablet (20 mg total) by mouth daily.  30 tablet  1   No facility-administered medications prior to visit.    PE: Blood pressure 149/96, pulse 85, temperature 98.1 F (36.7 C), temperature source Oral, resp. rate 16, weight 234 lb 4 oz (106.255 kg), last menstrual period 07/09/2012, SpO2 98.00%. Gen: Alert, well appearing.  Patient is oriented to person, place, time, and situation. HANDS:  no erythema, swelling, or tenderness of hands/fingers.  No joint hypertrophy or deformity.  ROM of fingers and wrists fully intact.   IMPRESSION AND PLAN:  Type II or unspecified type diabetes mellitus without mention of complication, uncontrolled Noncompliant with meds, diet, exercise. Encouraged compliance. Recheck HbA1c today. Return in 1 mo with log of fasting glucoses and/or 2H PP glucoses--at least 2 per week as a goal. At f/u, will check urine microalb/cr and do foot exam.  Also need to offer pneumovax and Tdap.  HYPERTENSION Poor control. Medication noncompliance. Encouraged compliance with current med. Check cr/lytes today.  Elevated transaminase level Mildly elevated last check.  Hx of fatty liver on u/s 09/19/2009. Will recheck  these today and if still up then will check anti hep C ab and hep B surface antigen.  Arthralgia of hand Bilateral. 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 Recommended tylenol 1000mg  q6h prn as first line treatment.   An After Visit Summary was printed and given to the patient.  Patient declined zostavax immunization today.  FOLLOW UP:  70mo

## 2012-07-17 LAB — HEMOGLOBIN A1C
Hgb A1c MFr Bld: 8.9 % — ABNORMAL HIGH (ref <5.7)
Mean Plasma Glucose: 209 mg/dL — ABNORMAL HIGH (ref <117)

## 2012-07-19 ENCOUNTER — Encounter: Payer: Self-pay | Admitting: Family Medicine

## 2012-07-19 DIAGNOSIS — R7401 Elevation of levels of liver transaminase levels: Secondary | ICD-10-CM

## 2012-07-19 DIAGNOSIS — M25549 Pain in joints of unspecified hand: Secondary | ICD-10-CM | POA: Insufficient documentation

## 2012-07-19 HISTORY — DX: Elevation of levels of liver transaminase levels: R74.01

## 2012-07-19 LAB — HEPATITIS B SURFACE ANTIGEN: Hepatitis B Surface Ag: NEGATIVE

## 2012-07-19 NOTE — Assessment & Plan Note (Addendum)
Mildly elevated last check.  Hx of fatty liver on u/s 09/19/2009. Will recheck these today and if still up then will check anti hep C ab and hep B surface antigen.

## 2012-07-19 NOTE — Assessment & Plan Note (Signed)
Bilateral. 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 Recommended tylenol 1000mg  q6h prn as first line treatment.

## 2012-07-19 NOTE — Assessment & Plan Note (Addendum)
Noncompliant with meds, diet, exercise. Encouraged compliance. Recheck HbA1c today. Return in 1 mo with log of fasting glucoses and/or 2H PP glucoses--at least 2 per week as a goal. At f/u, will check urine microalb/cr and do foot exam.  Also need to offer pneumovax and Tdap.

## 2012-07-19 NOTE — Assessment & Plan Note (Signed)
Poor control. Medication noncompliance. Encouraged compliance with current med. Check cr/lytes today.

## 2012-08-01 ENCOUNTER — Other Ambulatory Visit: Payer: Self-pay | Admitting: Family Medicine

## 2012-08-02 NOTE — Telephone Encounter (Signed)
Rx request to pharmacy/SLS  

## 2012-08-13 ENCOUNTER — Encounter: Payer: Self-pay | Admitting: Family Medicine

## 2012-08-13 ENCOUNTER — Ambulatory Visit (INDEPENDENT_AMBULATORY_CARE_PROVIDER_SITE_OTHER): Payer: BC Managed Care – PPO | Admitting: Family Medicine

## 2012-08-13 VITALS — BP 125/87 | HR 90 | Temp 98.0°F | Resp 14 | Wt 234.5 lb

## 2012-08-13 DIAGNOSIS — IMO0001 Reserved for inherently not codable concepts without codable children: Secondary | ICD-10-CM

## 2012-08-13 DIAGNOSIS — Z23 Encounter for immunization: Secondary | ICD-10-CM

## 2012-08-13 NOTE — Progress Notes (Signed)
OFFICE NOTE  08/13/2012  CC:  Chief Complaint  Patient presents with  . Follow-up    1-mth. [DM; HTN; Hand pain]     HPI: Patient is a 42 y.o. Caucasian female who is here for 1 mo f/u DM 2, HTN, and hand pain.    Hands feel much better using tylenol prn and resting as needed.  She has begun to play tennis again. Compliant with all DM and bp meds.  No side effects. No glucose numbers to report today. Has checked bp 3 times and all have been <140/90 since getting back on med.  Pertinent PMH:  Past Medical History  Diagnosis Date  . DIAB W/O COMP TYPE II/UNS NOT STATED UNCNTRL 03/21/2010  . HYPERLIPIDEMIA 03/21/2010  . HYPERTENSION 03/21/2010  . GERD 03/21/2010  . Fatty liver 09/19/09 (u/s)    Mildly elevated transaminases  . Cholelithiasis with obstruction 09/19/09 (u/s)  . DEEP VENOUS THROMBOPHLEBITIS, HX OF 03/21/2010    Qualifier: Diagnosis of  By: Caryl Never MD, Bruce    . History of herpes zoster 03/2012   Past surgical and family history reviewed and no changes noted since last office visit.  MEDS:  Outpatient Prescriptions Prior to Visit  Medication Sig Dispense Refill  . clobetasol (OLUX) 0.05 % topical foam Apply 1 application topically as needed. For sensitive sclap      . enalapril (VASOTEC) 2.5 MG tablet Take 1 tablet (2.5 mg total) by mouth daily.  90 tablet  1  . fish oil-omega-3 fatty acids 1000 MG capsule Take 1 g by mouth daily.      Marland Kitchen ibuprofen (ADVIL,MOTRIN) 200 MG tablet Take 400 mg by mouth every 6 (six) hours as needed. For pain      . Ivermectin (SKLICE) 0.5 % LOTN Apply 1 Tube topically once. Apply to dry scalp and hair closest to scalp first, then apply outward towards ends of hair; completely covering scalp and hair. Leave on for 10 minutes (start timing treatment after the scalp and hair have been completely covered). The hair should then be rinsed thoroughly with warm water. Avoid contact with the eyes. Nit combing is not required, although a fine-tooth comb may be  used to remove treated lice and nits.  1 Tube  0  . metFORMIN (GLUCOPHAGE-XR) 500 MG 24 hr tablet 1 tab in the AM, 2 tabs in the PM      . Multiple Vitamins-Minerals (MULTIVITAMIN WITH MINERALS) tablet Take 1 tablet by mouth daily.      . simvastatin (ZOCOR) 20 MG tablet TAKE 1 TABLET (20 MG TOTAL) BY MOUTH DAILY.  30 tablet  1   No facility-administered medications prior to visit.    PE: Blood pressure 125/87, pulse 90, temperature 98 F (36.7 C), temperature source Oral, resp. rate 14, weight 234 lb 8 oz (106.369 kg), last menstrual period 07/09/2012, SpO2 99.00%. Gen: Alert, well appearing.  Patient is oriented to person, place, time, and situation. CV: RRR, no m/r/g.   LUNGS: CTA bilat, nonlabored resps, good aeration in all lung fields. EXT: no clubbing, cyanosis, or edema.   FEET: DP and PT pulses trace bilat--doughy soft tissues around ankles and on top of feet.  Feet pink and warm. Monofilament testing normal in all areas of both feet.  No calluses, toenail abnormalities, or areas of skin breakdown noted.   IMPRESSION AND PLAN:  1) DM 2, poor control, recent noncompliance. Doing better back on meds.  Check urine microalb/cr today--pt reports long hx of proteinuria since her  last pregnancy so I may have to increase her ACE-I. She declined pneumovax.  Tdap given IM today. F/u 3 mo for HbA1c and CMET recheck.  2) HTN, improved control/stable.    3) Hand arthralgias: improved/resolved.  FOLLOW UP: 3 mo

## 2012-08-13 NOTE — Addendum Note (Signed)
Addended by: Regis Bill on: 08/13/2012 04:37 PM   Modules accepted: Orders

## 2012-08-14 LAB — MICROALBUMIN / CREATININE URINE RATIO: Microalb Creat Ratio: 4.9 mg/g (ref 0.0–30.0)

## 2012-08-16 ENCOUNTER — Other Ambulatory Visit: Payer: Self-pay | Admitting: Family Medicine

## 2012-08-16 NOTE — Telephone Encounter (Signed)
DENIED--Last Rx 05.19.14 #30x1--Too Soon for refill request/SLS

## 2012-09-09 ENCOUNTER — Other Ambulatory Visit: Payer: Self-pay | Admitting: Nurse Practitioner

## 2012-09-09 ENCOUNTER — Other Ambulatory Visit (HOSPITAL_BASED_OUTPATIENT_CLINIC_OR_DEPARTMENT_OTHER): Payer: BC Managed Care – PPO

## 2012-09-09 ENCOUNTER — Ambulatory Visit (HOSPITAL_BASED_OUTPATIENT_CLINIC_OR_DEPARTMENT_OTHER)
Admission: RE | Admit: 2012-09-09 | Discharge: 2012-09-09 | Disposition: A | Discharge status: Home / Self Care | Payer: BC Managed Care – PPO | Source: Ambulatory Visit | Attending: Nurse Practitioner | Admitting: Nurse Practitioner

## 2012-09-09 ENCOUNTER — Ambulatory Visit (INDEPENDENT_AMBULATORY_CARE_PROVIDER_SITE_OTHER): Payer: BC Managed Care – PPO | Admitting: Nurse Practitioner

## 2012-09-09 ENCOUNTER — Encounter: Payer: Self-pay | Admitting: Nurse Practitioner

## 2012-09-09 VITALS — BP 130/80 | HR 88 | Temp 98.0°F | Ht 62.75 in | Wt 232.0 lb

## 2012-09-09 DIAGNOSIS — M546 Pain in thoracic spine: Secondary | ICD-10-CM | POA: Insufficient documentation

## 2012-09-09 DIAGNOSIS — M542 Cervicalgia: Secondary | ICD-10-CM | POA: Insufficient documentation

## 2012-09-09 DIAGNOSIS — IMO0002 Reserved for concepts with insufficient information to code with codable children: Secondary | ICD-10-CM | POA: Insufficient documentation

## 2012-09-09 DIAGNOSIS — M51379 Other intervertebral disc degeneration, lumbosacral region without mention of lumbar back pain or lower extremity pain: Secondary | ICD-10-CM | POA: Insufficient documentation

## 2012-09-09 DIAGNOSIS — M5137 Other intervertebral disc degeneration, lumbosacral region: Secondary | ICD-10-CM | POA: Insufficient documentation

## 2012-09-09 DIAGNOSIS — M5430 Sciatica, unspecified side: Secondary | ICD-10-CM | POA: Insufficient documentation

## 2012-09-09 DIAGNOSIS — M5412 Radiculopathy, cervical region: Secondary | ICD-10-CM | POA: Insufficient documentation

## 2012-09-09 DIAGNOSIS — M545 Low back pain, unspecified: Secondary | ICD-10-CM | POA: Insufficient documentation

## 2012-09-09 DIAGNOSIS — Z8669 Personal history of other diseases of the nervous system and sense organs: Secondary | ICD-10-CM

## 2012-09-09 DIAGNOSIS — R209 Unspecified disturbances of skin sensation: Secondary | ICD-10-CM | POA: Insufficient documentation

## 2012-09-09 MED ORDER — PREDNISONE 10 MG PO TABS
ORAL_TABLET | ORAL | Status: DC
Start: 2012-09-09 — End: 2012-11-12

## 2012-09-09 NOTE — Patient Instructions (Addendum)
I will call with xray results, plan on PT. We will get it set up once I view xray results. Also we will fax referral to [facility] for weight loss management & diabetes nutrition counseling. In the meantime, aim for 30 minutes exercise 5 days/wk-take a walk! And calorie intake goal should be 1900 calories/day. Feel Better!  Back Exercises Back exercises help treat and prevent back injuries. The goal of back exercises is to increase the strength of your abdominal and back muscles and the flexibility of your back. These exercises should be started when you no longer have back pain. Back exercises include:  Pelvic Tilt. Lie on your back with your knees bent. Tilt your pelvis until the lower part of your back is against the floor. Hold this position 5 to 10 sec and repeat 5 to 10 times.  Knee to Chest. Pull first 1 knee up against your chest and hold for 20 to 30 seconds, repeat this with the other knee, and then both knees. This may be done with the other leg straight or bent, whichever feels better.  Sit-Ups or Curl-Ups. Bend your knees 90 degrees. Start with tilting your pelvis, and do a partial, slow sit-up, lifting your trunk only 30 to 45 degrees off the floor. Take at least 2 to 3 seconds for each sit-up. Do not do sit-ups with your knees out straight. If partial sit-ups are difficult, simply do the above but with only tightening your abdominal muscles and holding it as directed.  Hip-Lift. Lie on your back with your knees flexed 90 degrees. Push down with your feet and shoulders as you raise your hips a couple inches off the floor; hold for 10 seconds, repeat 5 to 10 times.  Back arches. Lie on your stomach, propping yourself up on bent elbows. Slowly press on your hands, causing an arch in your low back. Repeat 3 to 5 times. Any initial stiffness and discomfort should lessen with repetition over time.  Shoulder-Lifts. Lie face down with arms beside your body. Keep hips and torso pressed to floor as  you slowly lift your head and shoulders off the floor. Do not overdo your exercises, especially in the beginning. Exercises may cause you some mild back discomfort which lasts for a few minutes; however, if the pain is more severe, or lasts for more than 15 minutes, do not continue exercises until you see your caregiver. Improvement with exercise therapy for back problems is slow.  See your caregivers for assistance with developing a proper back exercise program. Document Released: 04/10/2004 Document Revised: 05/26/2011 Document Reviewed: 01/02/2011 [facility] Patient Information 2014 [place], [place].

## 2012-09-09 NOTE — Progress Notes (Signed)
Subjective:    Kristen Meyers is a 42 y.o. female who presents for evaluation of L arm tingling. Pt states tingling started last night after playing tennis. Additionally, she experienced tingling in L hip that radiated to L knee, which has resolved this am. The patient has had recurrent self limited episodes of low back pain in the past and with sciatica, but on the R side.. Symptoms have been present for about 15 hours and are stable.  Onset was related to / precipitated by no known injury and playing tennis. The pain is described as tingling and occurs all day.  Symptoms are exacerbated by playing tennis last night, also pt is severely obese, and sits for prolonged periods at work.. Symptoms are improved by nothing. The following portions of the patient's history were reviewed and updated as appropriate: allergies, current medications, past family history, past medical history, past social history, past surgical history and problem list.  Review of Systems Constitutional: negative for fatigue, fevers and night sweats Eyes: negative for visual disturbance Ears, nose, mouth, throat, and face: negative for hearing loss, hoarseness, nasal congestion and tinnitus Respiratory: negative for cough and dyspnea on exertion Cardiovascular: negative for chest pain, chest pressure/discomfort, fatigue, irregular heart beat, near-syncope and palpitations Musculoskeletal:positive for arthralgias, back pain and R leg sciatica in past, also states turned over in bed few days ago, experiencing L arm and shoulder pain-pain resolving. Neurological: negative for coordination problems, dizziness, gait problems, headaches and weakness Behavioral/Psych: negative for fatigue and irritability Endocrine: positive for current TX of diabetes, negative for diabetic symptoms including polydipsia, polyphagia and polyuria    Objective:   Normal reflexes, gait, strength and negative straight-leg raise. Inspection and palpation:  kyphosis noted cervical spine, spinal tenderness noted thoracic spine, paraspinal tenderness noted lumbar area, bilat., sacroiliac tenderness noted bilat.. Muscle tone and ROM exam: muscle tone normal without spasm. Neurological: normal DTRs, muscle strength and reflexes. Cranial nerves: normal Respiratory: Lungs clear to auscultation Cardiac: RRR, no murmur, scant swelling bilat LE, pedal pulses +2, cap refill brisk toes & hands, warm extremities Constitutional: Vs reviewed, no distress, pleasant, groomed ENT: eyes- lids & lashessymmetrical without discharge, pupils equal 2, reactive, conjugate gaze, peripheral fields of vision normal. Oropharynx moist symmetrical. Smile symmetrical.    Assessment:  1.cervical radiculopathy 2. L sciatica 3. BMI 41.4, Obesity Class III     Plan:   1. & 2. Complete spine xrays. Prednisone taper, Plan PT if no unstable vert fX. 3. Refer to [facility]. In meantime, encourage exercise & calorie count. See pt instructions.

## 2012-09-10 ENCOUNTER — Telehealth: Payer: Self-pay | Admitting: Nurse Practitioner

## 2012-09-10 DIAGNOSIS — M5134 Other intervertebral disc degeneration, thoracic region: Secondary | ICD-10-CM

## 2012-09-10 DIAGNOSIS — M5137 Other intervertebral disc degeneration, lumbosacral region: Secondary | ICD-10-CM

## 2012-09-10 NOTE — Telephone Encounter (Signed)
Pt has mild deg disc disease in thoracic & lumbar spine with spurring at l5-s. Discussed TX: NSAIDS, PT, weight loss. Pt did not start prednisone , but took ibuprophen last night, states arm tingling completely relieved this am.

## 2012-09-30 ENCOUNTER — Encounter: Payer: Self-pay | Admitting: Family Medicine

## 2012-09-30 ENCOUNTER — Ambulatory Visit (INDEPENDENT_AMBULATORY_CARE_PROVIDER_SITE_OTHER): Payer: BC Managed Care – PPO | Admitting: Family Medicine

## 2012-09-30 VITALS — BP 132/84 | HR 87 | Temp 97.6°F | Resp 16 | Ht 62.75 in | Wt 230.0 lb

## 2012-09-30 DIAGNOSIS — M25519 Pain in unspecified shoulder: Secondary | ICD-10-CM

## 2012-09-30 DIAGNOSIS — M545 Low back pain, unspecified: Secondary | ICD-10-CM

## 2012-09-30 DIAGNOSIS — M25512 Pain in left shoulder: Secondary | ICD-10-CM

## 2012-09-30 DIAGNOSIS — R209 Unspecified disturbances of skin sensation: Secondary | ICD-10-CM

## 2012-09-30 DIAGNOSIS — R202 Paresthesia of skin: Secondary | ICD-10-CM

## 2012-09-30 HISTORY — DX: Pain in left shoulder: M25.512

## 2012-09-30 HISTORY — DX: Paresthesia of skin: R20.2

## 2012-09-30 HISTORY — DX: Low back pain, unspecified: M54.50

## 2012-09-30 NOTE — Progress Notes (Signed)
OFFICE NOTE  09/30/2012  CC:  Chief Complaint  Patient presents with  . Follow-up     HPI: Patient is a 42 y.o. Caucasian female who is here for 3 week f/u for left arm paresthesias.  She was rx'd prednisone and referred to PT. These have completely resolved (went away in 2-4 days--before she started PT).   No more radiating pain down left leg either, has been getting PT on lower back lately and this has been helping. Her plain films of C-spine were normal and her plain films of L-spine were normal except some sign of DDD at L5-S1 level.  Has 3 wk hx of left shoulder pain, worse with abduction and with palpation, also with external rotation.  Pain is mild/mod, not causing sleep impairment and she's not taking any meds for it. Has never had injection before.  Pertinent PMH:  Past Medical History  Diagnosis Date  . DIAB W/O COMP TYPE II/UNS NOT STATED UNCNTRL 03/21/2010  . HYPERLIPIDEMIA 03/21/2010  . HYPERTENSION 03/21/2010  . GERD 03/21/2010  . Fatty liver 09/19/09 (u/s)    Mildly elevated transaminases  . Cholelithiasis with obstruction 09/19/09 (u/s)  . DEEP VENOUS THROMBOPHLEBITIS, HX OF 03/21/2010    Qualifier: Diagnosis of  By: Caryl Never MD, Bruce    . History of herpes zoster 03/2012   Past surgical, social, and family history reviewed and no changes noted since last office visit.  MEDS:  Outpatient Prescriptions Prior to Visit  Medication Sig Dispense Refill  . clobetasol (OLUX) 0.05 % topical foam Apply 1 application topically as needed. For sensitive sclap      . enalapril (VASOTEC) 2.5 MG tablet Take 1 tablet (2.5 mg total) by mouth daily.  90 tablet  1  . fish oil-omega-3 fatty acids 1000 MG capsule Take 1 g by mouth daily.      . metFORMIN (GLUCOPHAGE-XR) 500 MG 24 hr tablet 1 tab in the AM, 2 tabs in the PM      . Multiple Vitamins-Minerals (MULTIVITAMIN WITH MINERALS) tablet Take 1 tablet by mouth daily.      . simvastatin (ZOCOR) 20 MG tablet TAKE 1 TABLET (20 MG TOTAL) BY  MOUTH DAILY.  30 tablet  1  . predniSONE (DELTASONE) 10 MG tablet Take 4T po qd X 3d, then 3T po qd X 3d, then 2T po qd X3d, then 1T po qd X 3d, then d/c  30 tablet  0   No facility-administered medications prior to visit.    PE: Blood pressure 132/84, pulse 87, temperature 97.6 F (36.4 C), temperature source Temporal, resp. rate 16, height 5' 2.75" (1.594 m), weight 230 lb (104.327 kg), last menstrual period 08/15/2012, SpO2 98.00%. Gen: Alert, well appearing.  Patient is oriented to person, place, time, and situation. Neck: nontender but mild stiffness/discomfort with ROM. Left shoulder with mild TTP around acromion and over [facility] joint and with palpation of left deltoid mm. Spurling's testing + on left (caused left shoulder pain to worsen), neg on right. UE strength and sensation normal bilat. Pain with left arm abduction beginning at about 110 deg, mild pain with ER but not with IR.  IMPRESSION AND PLAN:  Paresthesia of left arm Resolved completely. Question of brachial plexopathy briefly (related to her tennis playing prior to onset of sx's). Less likely a cervical spinal nerve radiculopathy given it's "stocking glove" distribution from hand all the way up to top of shoulder. At any rate, all is back to normal.  Monitor for return of sx's.  Low back pain With hx of intermittent left leg radicular pain---improved.  Radicular pain is currently not present. She is currently getting PT for this.  Left shoulder pain Most consistent with rotator cuff arthropathy and AC joint arthritis. Mild sx's.  Patient not interested in any treatment at this time. Will keep in mind the possibility that this could conceivably be from a cervical spinal nerve entrapment, esp since her spurling's was positive on the left today.   An After Visit Summary was printed and given to the patient.  FOLLOW UP:  Has scheduled chronic illness f/u appt already.

## 2012-09-30 NOTE — Assessment & Plan Note (Signed)
Most consistent with rotator cuff arthropathy and AC joint arthritis. Mild sx's.  Patient not interested in any treatment at this time. Will keep in mind the possibility that this could conceivably be from a cervical spinal nerve entrapment, esp since her spurling's was positive on the left today.

## 2012-09-30 NOTE — Assessment & Plan Note (Signed)
Resolved completely. Question of brachial plexopathy briefly (related to her tennis playing prior to onset of sx's). Less likely a cervical spinal nerve radiculopathy given it's "stocking glove" distribution from hand all the way up to top of shoulder. At any rate, all is back to normal.  Monitor for return of sx's.

## 2012-09-30 NOTE — Assessment & Plan Note (Signed)
With hx of intermittent left leg radicular pain---improved.  Radicular pain is currently not present. She is currently getting PT for this.

## 2012-10-19 ENCOUNTER — Encounter: Payer: Self-pay | Admitting: Family Medicine

## 2012-10-19 HISTORY — DX: Morbid (severe) obesity due to excess calories: E66.01

## 2012-10-21 ENCOUNTER — Other Ambulatory Visit: Payer: Self-pay | Admitting: Family Medicine

## 2012-10-22 ENCOUNTER — Other Ambulatory Visit: Payer: Self-pay | Admitting: Family Medicine

## 2012-10-22 MED ORDER — METFORMIN HCL ER 500 MG PO TB24: ORAL_TABLET | ORAL | Status: DC

## 2012-10-25 ENCOUNTER — Other Ambulatory Visit: Payer: Self-pay | Admitting: Family Medicine

## 2012-10-25 MED ORDER — METFORMIN HCL ER 500 MG PO TB24: ORAL_TABLET | ORAL | Status: DC

## 2012-11-12 ENCOUNTER — Encounter: Payer: Self-pay | Admitting: Family Medicine

## 2012-11-12 ENCOUNTER — Ambulatory Visit (INDEPENDENT_AMBULATORY_CARE_PROVIDER_SITE_OTHER): Payer: BC Managed Care – PPO | Admitting: Family Medicine

## 2012-11-12 VITALS — BP 113/78 | HR 74 | Temp 98.4°F | Resp 16 | Wt 221.0 lb

## 2012-11-12 DIAGNOSIS — M25512 Pain in left shoulder: Secondary | ICD-10-CM

## 2012-11-12 DIAGNOSIS — M25519 Pain in unspecified shoulder: Secondary | ICD-10-CM

## 2012-11-12 LAB — HEMOGLOBIN A1C: Hgb A1c MFr Bld: 9 % — ABNORMAL HIGH (ref 4.6–6.5)

## 2012-11-12 NOTE — Progress Notes (Addendum)
OFFICE NOTE  11/12/2012  CC:  Chief Complaint  Patient presents with  . Follow-up    paresthesia of left arm, better.      HPI: Patient is a 42 y.o. Caucasian female who is here for 3 mo f/u for DM 2 and recheck left shoulder pain. Feeling better.  Eating more prudent diet.  Compliant with meds.  Denies polyuria or polydipsia. Left shoulder improving. No glucose numbers to report.   Pertinent PMH:  Past Medical History  Diagnosis Date  . DIAB W/O COMP TYPE II/UNS NOT STATED UNCNTRL 03/21/2010  . HYPERLIPIDEMIA 03/21/2010  . HYPERTENSION 03/21/2010  . GERD 03/21/2010  . Fatty liver 09/19/09 (u/s)    Mildly elevated transaminases  . Cholelithiasis without obstruction 09/19/09 (u/s)  . DEEP VENOUS THROMBOPHLEBITIS, HX OF 03/21/2010    Qualifier: Diagnosis of  By: Caryl Never MD, Bruce    . History of herpes zoster 03/2012   Past Surgical History  Procedure Laterality Date  . Cesarean section      2003, 2005  . Tubal ligation  2011    Dr. Malachy Mood is her GYN.    MEDS:  Outpatient Prescriptions Prior to Visit  Medication Sig Dispense Refill  . clobetasol (OLUX) 0.05 % topical foam Apply 1 application topically as needed. For sensitive sclap      . enalapril (VASOTEC) 2.5 MG tablet Take 1 tablet (2.5 mg total) by mouth daily.  90 tablet  1  . fish oil-omega-3 fatty acids 1000 MG capsule Take 1 g by mouth daily.      . metFORMIN (GLUCOPHAGE-XR) 500 MG 24 hr tablet 1 tab in the AM, 2 tabs in the PM      . Multiple Vitamins-Minerals (MULTIVITAMIN WITH MINERALS) tablet Take 1 tablet by mouth daily.      . simvastatin (ZOCOR) 20 MG tablet TAKE 1 TABLET (20 MG TOTAL) BY MOUTH DAILY.  30 tablet  1  . metFORMIN (GLUCOPHAGE-XR) 500 MG 24 hr tablet TAKE 1 TABLET BY MOUTH TWICE A DAY  180 tablet  0  . predniSONE (DELTASONE) 10 MG tablet Take 4T po qd X 3d, then 3T po qd X 3d, then 2T po qd X3d, then 1T po qd X 3d, then d/c  30 tablet  0   No facility-administered medications prior to visit.     PE: Blood pressure 113/78, pulse 74, temperature 98.4 F (36.9 C), temperature source Temporal, resp. rate 16, weight 221 lb (100.245 kg), last menstrual period 10/16/2012, SpO2 97.00%. Gen: Alert, well appearing.  Patient is oriented to person, place, time, and situation. Left shoulder: no deformity or asymmetry.  No tenderness to palpation.  Strength and ROM fully intact.  Mild pain with abduction, otherwise ROM brings no pain.   IMPRESSION AND PLAN:  Left shoulder pain Rotator cuff arthropathy most likely. This is improving w/out intervention.  Type II or unspecified type diabetes mellitus without mention of complication, uncontrolled Improved compliance with diet and meds. Recheck HbA1c, AST, and ALT today (fax copy of results to [facility] per pt's request). Continue current meds, diet, get more active. At f/u visits she still needs urine microalb/cr, foot exam, pneumovax, and Tdap.   FOLLOW UP: 4 mo

## 2012-11-17 DIAGNOSIS — K760 Fatty (change of) liver, not elsewhere classified: Secondary | ICD-10-CM

## 2012-11-17 HISTORY — DX: Fatty (change of) liver, not elsewhere classified: K76.0

## 2012-11-18 ENCOUNTER — Other Ambulatory Visit: Payer: Self-pay | Admitting: Family Medicine

## 2012-11-18 ENCOUNTER — Ambulatory Visit (INDEPENDENT_AMBULATORY_CARE_PROVIDER_SITE_OTHER): Payer: BC Managed Care – PPO | Admitting: Family Medicine

## 2012-11-18 ENCOUNTER — Encounter: Payer: Self-pay | Admitting: Family Medicine

## 2012-11-18 VITALS — BP 118/86 | HR 77 | Temp 98.2°F | Resp 16 | Ht 62.75 in | Wt 222.0 lb

## 2012-11-18 MED ORDER — ONETOUCH ULTRA SYSTEM W/DEVICE KIT: PACK | Status: DC

## 2012-11-18 MED ORDER — SIMVASTATIN 20 MG PO TABS: ORAL_TABLET | ORAL | Status: DC

## 2012-11-18 MED ORDER — METFORMIN HCL ER 500 MG PO TB24: ORAL_TABLET | ORAL | Status: DC

## 2012-11-18 MED ORDER — SITAGLIPTIN PHOSPHATE 100 MG PO TABS: 100.0000 mg | ORAL_TABLET | Freq: Every day | ORAL | Status: DC

## 2012-11-18 NOTE — Progress Notes (Signed)
OFFICE NOTE  11/18/2012  CC:  Chief Complaint  Patient presents with  . Diabetes    discuss adding medication per lab results     HPI: Patient is a 42 y.o. Caucasian female who is here for discussion of DM, worse control lately despite compliance with med and better diet.   UPDATE: pt has been seeing Dr. Cathey Endow at [facility] and approx 4 wks ago started a new diet: a 22g protein shake for BF and lunch, and for snacks and supper she eats lean protein and veggies..  She has lost approx 10 lbs. She does not monitor her glucoses at all--her glucometer broke. She prefers to avoid any injection meds but is open to additional oral med.  Pertinent PMH:  Past Medical History  Diagnosis Date  . DIAB W/O COMP TYPE II/UNS NOT STATED UNCNTRL 03/21/2010  . HYPERLIPIDEMIA 03/21/2010  . HYPERTENSION 03/21/2010  . GERD 03/21/2010  . Fatty liver 09/19/09 (u/s)    Mildly elevated transaminases  . Cholelithiasis without obstruction 09/19/09 (u/s)  . DEEP VENOUS THROMBOPHLEBITIS, HX OF 03/21/2010    Qualifier: Diagnosis of  By: Caryl Never MD, Bruce    . History of herpes zoster 03/2012   Past surgical, social, and family history reviewed and no changes noted since last office visit.  MEDS: **she started full 1g bid dosing of glucophage XR today. Outpatient Prescriptions Prior to Visit  Medication Sig Dispense Refill  . clobetasol (OLUX) 0.05 % topical foam Apply 1 application topically as needed. For sensitive sclap      . enalapril (VASOTEC) 2.5 MG tablet Take 1 tablet (2.5 mg total) by mouth daily.  90 tablet  1  . fish oil-omega-3 fatty acids 1000 MG capsule Take 1 g by mouth daily.      . metFORMIN (GLUCOPHAGE-XR) 500 MG 24 hr tablet 1 tab in the AM, 2 tabs in the PM      . Multiple Vitamins-Minerals (MULTIVITAMIN WITH MINERALS) tablet Take 1 tablet by mouth daily.       No facility-administered medications prior to visit.    PE: Blood pressure 118/86, pulse 77, temperature 98.2 F  (36.8 C), temperature source Temporal, resp. rate 16, height 5' 2.75" (1.594 m), weight 222 lb (100.699 kg), last menstrual period 11/11/2012, SpO2 98.00%. Gen: Alert, well appearing.  Patient is oriented to person, place, time, and situation. No further exam today.  LAB: none today Lab Results  Component Value Date   HGBA1C 9.0* 11/12/2012     IMPRESSION AND PLAN:  DM 2, poor control on near max dosing of metformin.  With her lifestyle mod/diet lately, we'll hold off on any injection meds and sulfonylureas.   I do think she needs more med to go with her diet in order to reach a goal HbA1c of <7%. Added Januvia 100mg  today--samples x 1 mo supply given today.  Start with 1/2 tab qAM for a few days and then increase to 100 mg qd.  Therapeutic expectations and side effect profile of medication discussed today.  Patient's questions answered. Check glucose qAM and then alternate a 2H postprandial with an hs check every day.  An After Visit Summary was printed and given to the patient.  FOLLOW UP: 3 mo

## 2012-11-24 ENCOUNTER — Telehealth: Payer: Self-pay | Admitting: Family Medicine

## 2012-11-24 MED ORDER — GLUCOSE BLOOD VI STRP: ORAL_STRIP | Status: DC

## 2012-11-24 MED ORDER — ONETOUCH LANCETS MISC: Status: DC

## 2012-11-24 NOTE — Telephone Encounter (Signed)
Sent in lancets and glucose test strips per patient to pharmacy.

## 2012-12-06 ENCOUNTER — Telehealth: Payer: Self-pay | Admitting: Family Medicine

## 2012-12-06 NOTE — Telephone Encounter (Signed)
Spoke to pharmacy, still has refills left on file. Refill request sent in error.

## 2012-12-13 NOTE — Assessment & Plan Note (Addendum)
Improved compliance with diet and meds. Recheck HbA1c, AST, and ALT today (fax copy of results to [facility] per pt's request). Continue current meds, diet, get more active. At f/u visits she still needs urine microalb/cr, foot exam, pneumovax, and Tdap.

## 2012-12-13 NOTE — Addendum Note (Signed)
Addended by: Jeoffrey Massed on: 12/13/2012 03:04 PM   Modules accepted: Level of Service

## 2012-12-13 NOTE — Assessment & Plan Note (Signed)
Rotator cuff arthropathy most likely. This is improving w/out intervention.

## 2012-12-21 ENCOUNTER — Telehealth: Payer: Self-pay | Admitting: Emergency Medicine

## 2012-12-21 NOTE — Telephone Encounter (Signed)
Called pharmacy and changed her lancets to the [facility] one touch lancets.  Patient aware.

## 2012-12-21 NOTE — Telephone Encounter (Signed)
Please advise patient when done.

## 2013-01-11 ENCOUNTER — Other Ambulatory Visit: Payer: Self-pay | Admitting: Family Medicine

## 2013-01-11 MED ORDER — ENALAPRIL MALEATE 2.5 MG PO TABS: 2.5000 mg | ORAL_TABLET | Freq: Every day | ORAL | Status: DC

## 2013-01-20 ENCOUNTER — Other Ambulatory Visit: Payer: Self-pay

## 2013-03-14 ENCOUNTER — Ambulatory Visit (INDEPENDENT_AMBULATORY_CARE_PROVIDER_SITE_OTHER): Payer: BC Managed Care – PPO | Admitting: Family Medicine

## 2013-03-14 ENCOUNTER — Encounter: Payer: Self-pay | Admitting: Family Medicine

## 2013-03-14 VITALS — BP 124/86 | HR 83 | Temp 99.0°F | Resp 18 | Ht 62.75 in | Wt 217.0 lb

## 2013-03-14 NOTE — Progress Notes (Signed)
Pre visit review using our clinic review tool, if applicable. No additional management support is needed unless otherwise documented below in the visit note. 

## 2013-03-14 NOTE — Progress Notes (Signed)
OFFICE NOTE  03/14/2013  CC:  Chief Complaint  Patient presents with  . Follow-up     HPI: Patient is a 42 y.o. Caucasian female who is here for 4 mo f/u DM 2, added januvia last visit. She has lost 5 more lbs since last visit.   Checking glucoses irregularly: highest was 130s 2H PP.  Others in the 120s. Diet not as rigorous in the holidays lately.  Pertinent PMH:  Past Medical History  Diagnosis Date  . DIAB W/O COMP TYPE II/UNS NOT STATED UNCNTRL 03/21/2010  . HYPERLIPIDEMIA 03/21/2010  . HYPERTENSION 03/21/2010  . GERD 03/21/2010  . Fatty liver 09/19/09 (u/s)    Mildly elevated transaminases  . Cholelithiasis without obstruction 09/19/09 (u/s)  . DEEP VENOUS THROMBOPHLEBITIS, HX OF 03/21/2010    Qualifier: Diagnosis of  By: Caryl Never MD, Bruce    . History of herpes zoster 03/2012   Past surgical, social, and family history reviewed and no changes noted since last office visit.  MEDS:  Outpatient Prescriptions Prior to Visit  Medication Sig Dispense Refill  . clobetasol (OLUX) 0.05 % topical foam Apply 1 application topically as needed. For sensitive sclap      . enalapril (VASOTEC) 2.5 MG tablet Take 1 tablet (2.5 mg total) by mouth daily.  90 tablet  1  . fish oil-omega-3 fatty acids 1000 MG capsule Take 1 g by mouth daily.      Marland Kitchen glucose blood test strip Use as instructed  100 each  12  . metFORMIN (GLUCOPHAGE-XR) 500 MG 24 hr tablet 2 tabs po bid  120 tablet  6  . Multiple Vitamins-Minerals (MULTIVITAMIN WITH MINERALS) tablet Take 1 tablet by mouth daily.      . simvastatin (ZOCOR) 20 MG tablet TAKE 1 TABLET (20 MG TOTAL) BY MOUTH DAILY.  30 tablet  3  . sitaGLIPtin (JANUVIA) 100 MG tablet Take 1 tablet (100 mg total) by mouth daily.  30 tablet  6  . Blood Glucose Monitoring Suppl (ONE TOUCH ULTRA SYSTEM KIT) W/DEVICE KIT Check glucose twice per day  1 each  6  . ONE TOUCH LANCETS MISC Check blood sugar twice daily, as needed  100 each  12   No facility-administered medications  prior to visit.    PE: Blood pressure 124/86, pulse 83, temperature 99 F (37.2 C), temperature source Temporal, resp. rate 18, height 5' 2.75" (1.594 m), weight 217 lb (98.431 kg), last menstrual period 03/07/2013, SpO2 99.00%. Gen: Alert, well appearing.  Patient is oriented to person, place, time, and situation. No further exam today.  IMPRESSION AND PLAN:  Type II or unspecified type diabetes mellitus without mention of complication, uncontrolled Control improved per home glucose checks. Check HbA1c today. Continue current meds and glucose monitoring and diet. Patient declined flu vaccine and pneumovax vaccine today.   An After Visit Summary was printed and given to the patient.  FOLLOW UP: 4 mo f/u CPE (fasting)

## 2013-03-14 NOTE — Assessment & Plan Note (Signed)
Control improved per home glucose checks. Check HbA1c today. Continue current meds and glucose monitoring and diet. Patient declined flu vaccine and pneumovax vaccine today.

## 2013-03-22 LAB — HEMOGLOBIN A1C: Hgb A1c MFr Bld: 7.7 % — ABNORMAL HIGH (ref 4.6–6.5)

## 2013-07-15 ENCOUNTER — Ambulatory Visit (INDEPENDENT_AMBULATORY_CARE_PROVIDER_SITE_OTHER): Payer: BC Managed Care – PPO | Admitting: Family Medicine

## 2013-07-15 ENCOUNTER — Encounter: Payer: Self-pay | Admitting: Family Medicine

## 2013-07-15 VITALS — BP 127/85 | HR 77 | Temp 99.0°F | Resp 18 | Ht 62.75 in | Wt 221.0 lb

## 2013-07-15 DIAGNOSIS — E1165 Type 2 diabetes mellitus with hyperglycemia: Secondary | ICD-10-CM

## 2013-07-15 DIAGNOSIS — Z0000 Encounter for general adult medical examination without abnormal findings: Secondary | ICD-10-CM

## 2013-07-15 DIAGNOSIS — E785 Hyperlipidemia, unspecified: Secondary | ICD-10-CM

## 2013-07-15 DIAGNOSIS — IMO0001 Reserved for inherently not codable concepts without codable children: Secondary | ICD-10-CM

## 2013-07-15 LAB — COMPREHENSIVE METABOLIC PANEL
ALBUMIN: 4 g/dL (ref 3.5–5.2)
ALT: 33 U/L (ref 0–35)
AST: 23 U/L (ref 0–37)
Alkaline Phosphatase: 69 U/L (ref 39–117)
BUN: 14 mg/dL (ref 6–23)
CALCIUM: 9 mg/dL (ref 8.4–10.5)
CHLORIDE: 106 meq/L (ref 96–112)
CO2: 23 meq/L (ref 19–32)
Creatinine, Ser: 0.6 mg/dL (ref 0.4–1.2)
GFR: 117.4 mL/min (ref >60.00)
GLUCOSE: 152 mg/dL — AB (ref 70–99)
POTASSIUM: 4.8 meq/L (ref 3.5–5.1)
Sodium: 136 mEq/L (ref 135–145)
Total Bilirubin: 0.7 mg/dL (ref 0.3–1.2)
Total Protein: 6.7 g/dL (ref 6.0–8.3)

## 2013-07-15 LAB — CBC WITH DIFFERENTIAL/PLATELET
BASOS ABS: 0 10*3/uL (ref 0.0–0.1)
Basophils Relative: 0.5 % (ref 0.0–3.0)
EOS ABS: 0.1 10*3/uL (ref 0.0–0.7)
Eosinophils Relative: 2.1 % (ref 0.0–5.0)
HCT: 40.1 % (ref 36.0–46.0)
Hemoglobin: 13.3 g/dL (ref 12.0–15.0)
LYMPHS PCT: 31.7 % (ref 12.0–46.0)
Lymphs Abs: 2 10*3/uL (ref 0.7–4.0)
MCHC: 33.2 g/dL (ref 30.0–36.0)
MCV: 85.5 fl (ref 78.0–100.0)
MONOS PCT: 5.9 % (ref 3.0–12.0)
Monocytes Absolute: 0.4 10*3/uL (ref 0.1–1.0)
NEUTROS PCT: 59.8 % (ref 43.0–77.0)
Neutro Abs: 3.8 10*3/uL (ref 1.4–7.7)
Platelets: 232 10*3/uL (ref 150.0–400.0)
RBC: 4.69 Mil/uL (ref 3.87–5.11)
RDW: 15 % — AB (ref 11.5–14.6)
WBC: 6.3 10*3/uL (ref 4.5–10.5)

## 2013-07-15 LAB — LIPID PANEL
CHOLESTEROL: 178 mg/dL (ref 0–200)
HDL: 51.4 mg/dL (ref >39.00)
LDL Cholesterol: 103 mg/dL — ABNORMAL HIGH (ref 0–99)
Total CHOL/HDL Ratio: 3
Triglycerides: 119 mg/dL (ref 0.0–149.0)
VLDL: 23.8 mg/dL (ref 0.0–40.0)

## 2013-07-15 LAB — HEMOGLOBIN A1C: HEMOGLOBIN A1C: 7.4 % — AB (ref 4.6–6.5)

## 2013-07-15 LAB — TSH: TSH: 1.97 u[IU]/mL (ref 0.35–5.50)

## 2013-07-15 NOTE — Progress Notes (Signed)
Office Note 07/20/2013  CC:  Chief Complaint  Patient presents with  . Annual Exam    fasting    HPI:  Kristen Meyers is a 43 y.o. White female who is here for CPE. Due for CMET, FLP, TSH, and HbA1c.  Not checking glucoses at home.   No bp checks at home. Says she has eye exam scheduled for 07/2013.  No acute complaints, no questions.   Past Medical History  Diagnosis Date  . DIAB W/O COMP TYPE II/UNS NOT STATED UNCNTRL 03/21/2010  . HYPERLIPIDEMIA 03/21/2010  . HYPERTENSION 03/21/2010  . GERD 03/21/2010  . Fatty liver 09/19/09 (u/s)    Mildly elevated transaminases  . Cholelithiasis without obstruction 09/19/09 (u/s)  . DEEP VENOUS THROMBOPHLEBITIS, HX OF 03/21/2010    Qualifier: Diagnosis of  By: Elease Hashimoto MD, Bruce    . History of herpes zoster 03/2012    Past Surgical History  Procedure Laterality Date  . Cesarean section      2003, 2005  . Tubal ligation  2011    Dr. Reino Kent is her GYN.    Family History  Problem Relation Age of Onset  . Cancer Mother     colon, ovarian, lung  . Heart disease Mother   . Hypertension Mother   . Hyperlipidemia Mother   . Diabetes Mother     type ll    History   Social History  . Marital Status: Married    Spouse Name: N/A    Number of Children: N/A  . Years of Education: N/A   Occupational History  . Not on file.   Social History Main Topics  . Smoking status: Never Smoker   . Smokeless tobacco: Never Used  . Alcohol Use: No  . Drug Use: No  . Sexual Activity: Not on file   Other Topics Concern  . Not on file   Social History Narrative   Married, 1 child.   Occupation: Works in [employer] all day ([employer]).   Orig from [place], currently living in [place].   No T/A/Ds.   Exercise: 1- 3 days a week, steps + cardio.    Outpatient Prescriptions Prior to Visit  Medication Sig Dispense Refill  . clobetasol (OLUX) 0.05 % topical foam Apply 1 application topically as needed. For sensitive sclap      .  enalapril (VASOTEC) 2.5 MG tablet Take 1 tablet (2.5 mg total) by mouth daily.  90 tablet  1  . fish oil-omega-3 fatty acids 1000 MG capsule Take 1 g by mouth daily.      . metFORMIN (GLUCOPHAGE-XR) 500 MG 24 hr tablet 2 tabs po bid  120 tablet  6  . Multiple Vitamins-Minerals (MULTIVITAMIN WITH MINERALS) tablet Take 1 tablet by mouth daily.      . simvastatin (ZOCOR) 20 MG tablet TAKE 1 TABLET (20 MG TOTAL) BY MOUTH DAILY.  30 tablet  3  . sitaGLIPtin (JANUVIA) 100 MG tablet Take 1 tablet (100 mg total) by mouth daily.  30 tablet  6  . Blood Glucose Monitoring Suppl (ONE TOUCH ULTRA SYSTEM KIT) W/DEVICE KIT Check glucose twice per day  1 each  6  . glucose blood test strip Use as instructed  100 each  12  . ONE TOUCH LANCETS MISC Check blood sugar twice daily, as needed  100 each  12   No facility-administered medications prior to visit.    Allergies  Allergen Reactions  . Codeine Hives    ROS Review of Systems  Constitutional: Negative for fever, chills, appetite change and fatigue.  HENT: Negative for congestion, dental problem, ear pain and sore throat.   Eyes: Negative for discharge, redness and visual disturbance.  Respiratory: Negative for cough, chest tightness, shortness of breath and wheezing.   Cardiovascular: Negative for chest pain, palpitations and leg swelling.  Gastrointestinal: Negative for nausea, vomiting, abdominal pain, diarrhea and blood in stool.  Genitourinary: Negative for dysuria, urgency, frequency, hematuria, flank pain and difficulty urinating.  Musculoskeletal: Negative for arthralgias, back pain, joint swelling, myalgias and neck stiffness.  Skin: Negative for pallor and rash.  Neurological: Negative for dizziness, speech difficulty, weakness and headaches.  Hematological: Negative for adenopathy. Does not bruise/bleed easily.  Psychiatric/Behavioral: Negative for confusion and sleep disturbance. The patient is not nervous/anxious.     PE; Blood  pressure 127/85, pulse 77, temperature 99 F (37.2 C), temperature source Temporal, resp. rate 18, height 5' 2.75" (1.594 m), weight 221 lb (100.245 kg), last menstrual period 06/24/2013, SpO2 99.00%. Gen: Alert, well appearing, obese white female.  Patient is oriented to person, place, time, and situation. AFFECT: pleasant, lucid thought and speech. ENT: Ears: EACs clear, normal epithelium.  TMs with good light reflex and landmarks bilaterally.  Eyes: no injection, icteris, swelling, or exudate.  EOMI, PERRLA. Nose: no drainage or turbinate edema/swelling.  No injection or focal lesion.  Mouth: lips without lesion/swelling.  Oral mucosa pink and moist.  Dentition intact and without obvious caries or gingival swelling.  Oropharynx without erythema, exudate, or swelling.  Neck: supple/nontender.  No LAD, mass, or TM.  Carotid pulses 2+ bilaterally, without bruits. CV: RRR, no m/r/g.   LUNGS: CTA bilat, nonlabored resps, good aeration in all lung fields. ABD: soft, NT, ND, BS normal.  No hepatospenomegaly or mass.  No bruits. EXT: no clubbing, cyanosis, or edema.  Musculoskeletal: no joint swelling, erythema, warmth, or tenderness.  ROM of all joints intact. Skin - no sores or suspicious lesions or rashes or color changes  Pertinent labs:  None today  ASSESSMENT AND PLAN:   Health maintenance examination Reviewed age and gender appropriate health maintenance issues (prudent diet, regular exercise, health risks of tobacco and excessive alcohol, use of seatbelts, fire alarms in home, use of sunscreen).  Also reviewed age and gender appropriate health screening as well as vaccine recommendations. Pap UTD.  Start breast ca screening with mammogram at age 23.  Colonoscopy at age 33 (need to further clarify pt's FH, though, b/c it lists her mother as having "colon, ovarian, and lung" cancers. Will recommend pneumovax at next f/u visit.  Eye exam planned for this month. Check FLP, CBC, TSH, CMET,and  HbA1c today.   An After Visit Summary was printed and given to the patient.  FOLLOW UP:  Return in about 4 months (around 11/15/2013) for routine chronic illness f/u.

## 2013-07-15 NOTE — Progress Notes (Signed)
Pre visit review using our clinic review tool, if applicable. No additional management support is needed unless otherwise documented below in the visit note. 

## 2013-07-20 DIAGNOSIS — Z0000 Encounter for general adult medical examination without abnormal findings: Secondary | ICD-10-CM

## 2013-07-20 HISTORY — DX: Encounter for general adult medical examination without abnormal findings: Z00.00

## 2013-07-20 NOTE — Assessment & Plan Note (Signed)
Reviewed age and gender appropriate health maintenance issues (prudent diet, regular exercise, health risks of tobacco and excessive alcohol, use of seatbelts, fire alarms in home, use of sunscreen).  Also reviewed age and gender appropriate health screening as well as vaccine recommendations. Pap UTD.  Start breast ca screening with mammogram at age 43.  Colonoscopy at age 78 (need to further clarify pt's FH, though, b/c it lists her mother as having "colon, ovarian, and lung" cancers. Will recommend pneumovax at next f/u visit.  Eye exam planned for this month. Check FLP, CBC, TSH, CMET,and HbA1c today.

## 2013-08-09 LAB — HM DIABETES EYE EXAM

## 2013-08-10 ENCOUNTER — Other Ambulatory Visit: Payer: Self-pay | Admitting: Family Medicine

## 2013-09-23 ENCOUNTER — Telehealth: Payer: Self-pay

## 2013-09-23 NOTE — Telephone Encounter (Signed)
Pt called and wanted to let Dr Anitra Lauth know that she has been prescribed a medication called Saxenda 0.6 mg and will go up to taking 3 pills a day. She states she knows this medication will affect her blood sugar and wants to see if you are ok with her taking this with the metformin and other meds she is taking. She is taking this for an appetite suppressant. [phone]. Please advise.

## 2013-09-25 ENCOUNTER — Encounter: Payer: Self-pay | Admitting: Family Medicine

## 2013-09-25 NOTE — Telephone Encounter (Signed)
OK. I recommend she stop her metformin and [place]. The saxenda will be good for her sugars.

## 2013-09-26 NOTE — Telephone Encounter (Signed)
Patient aware of new medications.  Pt voiced understanding.

## 2013-11-16 ENCOUNTER — Ambulatory Visit: Payer: BC Managed Care – PPO | Admitting: Family Medicine

## 2013-11-23 ENCOUNTER — Encounter: Payer: Self-pay | Admitting: Family Medicine

## 2013-11-23 ENCOUNTER — Ambulatory Visit (INDEPENDENT_AMBULATORY_CARE_PROVIDER_SITE_OTHER): Payer: BC Managed Care – PPO | Admitting: Family Medicine

## 2013-11-23 VITALS — BP 125/84 | HR 79 | Temp 98.2°F | Resp 16 | Ht 62.75 in | Wt 213.0 lb

## 2013-11-23 DIAGNOSIS — I10 Essential (primary) hypertension: Secondary | ICD-10-CM

## 2013-11-23 DIAGNOSIS — E1165 Type 2 diabetes mellitus with hyperglycemia: Principal | ICD-10-CM

## 2013-11-23 DIAGNOSIS — IMO0001 Reserved for inherently not codable concepts without codable children: Secondary | ICD-10-CM

## 2013-11-23 DIAGNOSIS — E785 Hyperlipidemia, unspecified: Secondary | ICD-10-CM

## 2013-11-23 LAB — MICROALBUMIN / CREATININE URINE RATIO
Creatinine,U: 178.9 mg/dL
MICROALB UR: 1.8 mg/dL (ref 0.0–1.9)
Microalb Creat Ratio: 1 mg/g (ref 0.0–30.0)

## 2013-11-23 LAB — HEMOGLOBIN A1C: HEMOGLOBIN A1C: 6.9 % — AB (ref 4.6–6.5)

## 2013-11-23 NOTE — Progress Notes (Signed)
Pre visit review using our clinic review tool, if applicable. No additional management support is needed unless otherwise documented below in the visit note. 

## 2013-11-23 NOTE — Progress Notes (Signed)
OFFICE NOTE  11/23/2013  CC:  Chief Complaint  Patient presents with  . Follow-up    fasting     HPI: Patient is a 43 y.o. Caucasian female who is here for 4 mo f/u DM 2, HTN, hyperlipidemia, obesity. "Everything's doing great". About 50% compliant with meds. Exercising/dieting well lately, has lost about 8 lbs in last 4 mo.   Pertinent PMH:  Past medical, surgical, social, and family history reviewed and no changes noted since last office visit.  MEDS:  Outpatient Prescriptions Prior to Visit  Medication Sig Dispense Refill  . clobetasol (OLUX) 0.05 % topical foam Apply 1 application topically as needed. For sensitive sclap      . enalapril (VASOTEC) 2.5 MG tablet Take 1 tablet (2.5 mg total) by mouth daily.  90 tablet  1  . fish oil-omega-3 fatty acids 1000 MG capsule Take 1 g by mouth daily.      . Liraglutide -Weight Management (SAXENDA) 6 MG/ML SOPN Inject into the skin. Per wt mgmt clinic--09/2013      . metFORMIN (GLUCOPHAGE-XR) 500 MG 24 hr tablet 2 tabs po bid  120 tablet  6  . Multiple Vitamins-Minerals (MULTIVITAMIN WITH MINERALS) tablet Take 1 tablet by mouth daily.      . simvastatin (ZOCOR) 20 MG tablet TAKE 1 TABLET (20 MG TOTAL) BY MOUTH DAILY.  30 tablet  3  . sitaGLIPtin (JANUVIA) 100 MG tablet Take 1 tablet (100 mg total) by mouth daily.  30 tablet  6  . Blood Glucose Monitoring Suppl (ONE TOUCH ULTRA SYSTEM KIT) W/DEVICE KIT Check glucose twice per day  1 each  6  . glucose blood test strip Use as instructed  100 each  12  . ONE TOUCH LANCETS MISC Check blood sugar twice daily, as needed  100 each  12   No facility-administered medications prior to visit.    PE: Blood pressure 125/84, pulse 79, temperature 98.2 F (36.8 C), temperature source Temporal, resp. rate 16, height 5' 2.75" (1.594 m), weight 213 lb (96.616 kg), SpO2 99.00%. Gen: Alert, well appearing.  Patient is oriented to person, place, time, and situation. Foot exam - bilateral normal; no  swelling, tenderness or skin or vascular lesions. Color and temperature is normal. Sensation is intact. Peripheral pulses are palpable. Toenails are normal.  LABS:  Lab Results  Component Value Date   HGBA1C 7.4* 07/15/2013   Lab Results  Component Value Date   CHOL 178 07/15/2013   HDL 51.40 07/15/2013   LDLCALC 103* 07/15/2013   TRIG 119.0 07/15/2013   CHOLHDL 3 07/15/2013     Chemistry      Component Value Date/Time   NA 136 07/15/2013 0941   K 4.8 07/15/2013 0941   CL 106 07/15/2013 0941   CO2 23 07/15/2013 0941   BUN 14 07/15/2013 0941   CREATININE 0.6 07/15/2013 0941   CREATININE 0.71 07/16/2012 1546      Component Value Date/Time   CALCIUM 9.0 07/15/2013 0941   ALKPHOS 69 07/15/2013 0941   AST 23 07/15/2013 0941   ALT 33 07/15/2013 0941   BILITOT 0.7 07/15/2013 0941     Lab Results  Component Value Date   WBC 6.3 07/15/2013   HGB 13.3 07/15/2013   HCT 40.1 07/15/2013   MCV 85.5 07/15/2013   PLT 232.0 07/15/2013   Lab Results  Component Value Date   TSH 1.97 07/15/2013     IMPRESSION AND PLAN:  1) DM 2, not ideal control but improved  A1c last visit. Recheck HbA1c today. Feet exam normal today. Urine microalb/cr today.  2) HTN: The current medical regimen is effective;  continue present plan and medications.  3) Hyperlipidemia: LDL 103 last check. Continue statin.  4) Prev health care: pt declined flu vaccine today.  5) Obesity: making progress with TLC.  An After Visit Summary was printed and given to the patient.  FOLLOW UP: 4mo     

## 2013-11-24 ENCOUNTER — Other Ambulatory Visit: Payer: Self-pay | Admitting: Family Medicine

## 2013-11-25 ENCOUNTER — Other Ambulatory Visit: Payer: Self-pay | Admitting: Obstetrics and Gynecology

## 2013-11-28 ENCOUNTER — Other Ambulatory Visit: Payer: Self-pay | Admitting: Obstetrics and Gynecology

## 2013-11-28 DIAGNOSIS — N63 Unspecified lump in unspecified breast: Secondary | ICD-10-CM

## 2013-11-29 LAB — CYTOLOGY - PAP

## 2013-11-30 ENCOUNTER — Other Ambulatory Visit: Payer: BC Managed Care – PPO

## 2013-11-30 ENCOUNTER — Ambulatory Visit
Admission: RE | Admit: 2013-11-30 | Discharge: 2013-11-30 | Disposition: A | Discharge status: Home / Self Care | Payer: BC Managed Care – PPO | Source: Ambulatory Visit | Attending: Obstetrics and Gynecology | Admitting: Obstetrics and Gynecology

## 2013-11-30 DIAGNOSIS — N63 Unspecified lump in unspecified breast: Secondary | ICD-10-CM

## 2013-12-04 ENCOUNTER — Other Ambulatory Visit: Payer: Self-pay | Admitting: Family Medicine

## 2013-12-26 ENCOUNTER — Ambulatory Visit (INDEPENDENT_AMBULATORY_CARE_PROVIDER_SITE_OTHER): Payer: BC Managed Care – PPO | Admitting: Family Medicine

## 2013-12-26 ENCOUNTER — Encounter: Payer: Self-pay | Admitting: Family Medicine

## 2013-12-26 VITALS — BP 129/91 | HR 109 | Temp 99.4°F | Resp 20 | Ht 62.75 in | Wt 211.0 lb

## 2013-12-26 DIAGNOSIS — J180 Bronchopneumonia, unspecified organism: Secondary | ICD-10-CM

## 2013-12-26 MED ORDER — CLARITHROMYCIN 500 MG PO TABS: 500.0000 mg | ORAL_TABLET | Freq: Two times a day (BID) | ORAL | Status: DC

## 2013-12-26 NOTE — Progress Notes (Signed)
Pre visit review using our clinic review tool, if applicable. No additional management support is needed unless otherwise documented below in the visit note. 

## 2013-12-26 NOTE — Progress Notes (Signed)
OFFICE NOTE  12/26/2013  CC:  Chief Complaint  Patient presents with  . Cough    x Thursday  . chest congestion  . Fever    101 was the highest   HPI: Patient is a 43 y.o. Caucasian female who is here for respiratory complaints.   Has had about a week or more of nasal mucous with PND, then 3-4 d/a started coughing a lot, chest feeling tight, wheezing, SOB with activity, Tm101, Temp up to 100 yesterday.  Some L>R ear pressure/pain.  Feels somewhat better last night and today.  Nyquil and OTC delsym used recently helped.  She is not a smoker.  No known hx of asthma.  No n/v/d.  No body aches except some around chest when she coughs.  Pertinent PMH:  Past medical, surgical, social, and family history reviewed and no changes are noted since last office visit.  MEDS:  Outpatient Prescriptions Prior to Visit  Medication Sig Dispense Refill  . Blood Glucose Monitoring Suppl (ONE TOUCH ULTRA SYSTEM KIT) W/DEVICE KIT Check glucose twice per day  1 each  6  . clobetasol (OLUX) 0.05 % topical foam Apply 1 application topically as needed. For sensitive sclap      . enalapril (VASOTEC) 2.5 MG tablet Take 1 tablet (2.5 mg total) by mouth daily.  90 tablet  1  . fish oil-omega-3 fatty acids 1000 MG capsule Take 1 g by mouth daily.      . Liraglutide -Weight Management (SAXENDA) 6 MG/ML SOPN Inject into the skin. Per wt mgmt clinic--09/2013      . metFORMIN (GLUCOPHAGE-XR) 500 MG 24 hr tablet 2 tabs po bid  120 tablet  6  . Multiple Vitamins-Minerals (MULTIVITAMIN WITH MINERALS) tablet Take 1 tablet by mouth daily.      . simvastatin (ZOCOR) 20 MG tablet TAKE 1 TABLET (20 MG TOTAL) BY MOUTH DAILY.  30 tablet  3  . sitaGLIPtin (JANUVIA) 100 MG tablet Take 1 tablet (100 mg total) by mouth daily.  30 tablet  6  . glucose blood test strip Use as instructed  100 each  12  . ONE TOUCH LANCETS MISC Check blood sugar twice daily, as needed  100 each  12  . metFORMIN (GLUCOPHAGE-XR) 500 MG 24 hr tablet TAKE  1 TABLET BY MOUTH TWICE A DAY  180 tablet  0   No facility-administered medications prior to visit.    PE: Blood pressure 129/91, pulse 109, temperature 99.4 F (37.4 C), temperature source Temporal, resp. rate 20, height 5' 2.75" (1.594 m), weight 211 lb (95.709 kg), last menstrual period 11/29/2013, SpO2 97.00%. VS: noted--normal. Gen: alert, NAD, NONTOXIC APPEARING. HEENT: eyes without injection, drainage, or swelling.  Ears: EACs clear, TMs with normal light reflex and landmarks.  Nose: Clear rhinorrhea, with some dried, crusty exudate adherent to mildly injected mucosa.  No purulent d/c.  No paranasal sinus TTP.  No facial swelling.  Throat and mouth without focal lesion.  No pharyngial swelling, erythema, or exudate.   Neck: supple, no LAD.   LUNGS: CTA bilat, nonlabored resps.   CV: RRR, no m/r/g. EXT: no c/c/e SKIN: no rash  IMPRESSION AND PLAN:  Acute bronchopneumonia. Clarith 500 mg bid x 10d (hold simvastatin while on this med). Robitussin DM OTC recommended.  An After Visit Summary was printed and given to the patient. She declined flu vaccine. FOLLOW UP: prn

## 2014-02-14 HISTORY — PX: LAPAROSCOPIC HYSTERECTOMY: SHX1926

## 2014-03-06 ENCOUNTER — Encounter: Payer: Self-pay | Admitting: Family Medicine

## 2014-03-06 ENCOUNTER — Ambulatory Visit (INDEPENDENT_AMBULATORY_CARE_PROVIDER_SITE_OTHER): Payer: BC Managed Care – PPO | Admitting: Family Medicine

## 2014-03-06 VITALS — BP 137/89 | HR 91 | Temp 97.2°F | Resp 18 | Wt 216.0 lb

## 2014-03-06 DIAGNOSIS — N3001 Acute cystitis with hematuria: Secondary | ICD-10-CM

## 2014-03-06 DIAGNOSIS — R350 Frequency of micturition: Secondary | ICD-10-CM

## 2014-03-06 LAB — POCT URINALYSIS DIPSTICK
Bilirubin, UA: NEGATIVE
Glucose, UA: 1000
Nitrite, UA: POSITIVE
PH UA: 5
PROTEIN UA: 100
Spec Grav, UA: >=1.03
UROBILINOGEN UA: 0.2

## 2014-03-06 MED ORDER — CIPROFLOXACIN HCL 500 MG PO TABS: 500.0000 mg | ORAL_TABLET | Freq: Two times a day (BID) | ORAL | Status: DC

## 2014-03-06 NOTE — Progress Notes (Signed)
Pre visit review using our clinic review tool, if applicable. No additional management support is needed unless otherwise documented below in the visit note. 

## 2014-03-06 NOTE — Patient Instructions (Signed)
Restart Saxenda.  Two days prior to surgery you can stop your metformin.

## 2014-03-06 NOTE — Progress Notes (Signed)
OFFICE NOTE  03/06/2014  CC:  Chief Complaint  Patient presents with  . Urinary Frequency    x yesterday   . Back Pain   HPI: Patient is a 43 y.o. Caucasian female who is here for urinary frequency.   +Urgency, mild dysuria.  Mild diffuse LB soreness.  Onset yesterday.  No fever or nausea. No AZO taken.   She was taken off her Saxenda a few weeks ago in prep for her upcoming hysterectomy surgery (??) She has not been monitoring her glucoses lately.  Pertinent PMH:  Past medical, surgical, social, and family history reviewed and no changes are noted since last office visit.  MEDS: Not on januvia, biaxin, or saxenda listed below Outpatient Prescriptions Prior to Visit  Medication Sig Dispense Refill  . clobetasol (OLUX) 0.05 % topical foam Apply 1 application topically as needed. For sensitive sclap    . enalapril (VASOTEC) 2.5 MG tablet Take 1 tablet (2.5 mg total) by mouth daily. 90 tablet 1  . fish oil-omega-3 fatty acids 1000 MG capsule Take 1 g by mouth daily.    . metFORMIN (GLUCOPHAGE-XR) 500 MG 24 hr tablet 2 tabs po bid 120 tablet 6  . Multiple Vitamins-Minerals (MULTIVITAMIN WITH MINERALS) tablet Take 1 tablet by mouth daily.    . simvastatin (ZOCOR) 20 MG tablet TAKE 1 TABLET (20 MG TOTAL) BY MOUTH DAILY. 30 tablet 3  . sitaGLIPtin (JANUVIA) 100 MG tablet Take 1 tablet (100 mg total) by mouth daily. 30 tablet 6  . Blood Glucose Monitoring Suppl (ONE TOUCH ULTRA SYSTEM KIT) W/DEVICE KIT Check glucose twice per day 1 each 6  . glucose blood test strip Use as instructed 100 each 12  . Liraglutide -Weight Management (SAXENDA) 6 MG/ML SOPN Inject into the skin. Per wt mgmt clinic--09/2013    . ONE TOUCH LANCETS MISC Check blood sugar twice daily, as needed 100 each 12  . clarithromycin (BIAXIN) 500 MG tablet Take 1 tablet (500 mg total) by mouth 2 (two) times daily. 20 tablet 0   No facility-administered medications prior to visit.    PE: Blood pressure 137/89, pulse 91,  temperature 97.2 F (36.2 C), temperature source Temporal, resp. rate 18, weight 216 lb (97.977 kg), SpO2 99 %. Gen: Alert, well appearing.  Patient is oriented to person, place, time, and situation. AFFECT: pleasant, lucid thought and speech. No further exam today.  LAB: CC UA today showed >1000 gluc mg/dl, SG 1.030, large blood, protein mg/dl, nitrite +, Leuk trace  IMPRESSION AND PLAN:  1) UTI: cipro 500 mg bid x 3d.  2) Uncontrolled DM 2; restart saxenda 1.8 mg SQ qd.  Stop metformin 2d prior to hysterectomy date.  An After Visit Summary was printed and given to the patient.  FOLLOW UP: prn     

## 2014-03-08 ENCOUNTER — Other Ambulatory Visit: Payer: Self-pay | Admitting: Family Medicine

## 2014-03-08 ENCOUNTER — Telehealth: Payer: Self-pay | Admitting: Family Medicine

## 2014-03-08 LAB — URINE CULTURE

## 2014-03-08 MED ORDER — CEFDINIR 300 MG PO CAPS: 300.0000 mg | ORAL_CAPSULE | Freq: Two times a day (BID) | ORAL | Status: DC

## 2014-03-08 NOTE — Telephone Encounter (Signed)
Please advise 

## 2014-03-08 NOTE — Telephone Encounter (Signed)
Patient only has one more dose of antibiotic to take tonight but she is still has symptoms, urinary frequency & pressure in lower abdominal area. Please call patient

## 2014-03-08 NOTE — Telephone Encounter (Signed)
Pls call pt and tell her that her antibiotic sensitivity information has not come back yet. I will send in cefdinir now, a different antibiotic that she should respond to. We will call her back when the final culture and sensitivity information comes back--it will be tomorrow.-thx

## 2014-03-08 NOTE — Telephone Encounter (Signed)
Patient notified

## 2014-03-08 NOTE — Telephone Encounter (Signed)
Called pt, asked her to wait until sensitivities come back this afternoon so we can get better antibiotic guidance. If sensitivities not back by 5 or so, then will choose new antibiotic and send it in and let pt know at that time.  Pt ok with this plan.

## 2014-03-15 ENCOUNTER — Other Ambulatory Visit: Payer: Self-pay | Admitting: Obstetrics and Gynecology

## 2014-03-23 ENCOUNTER — Telehealth: Payer: Self-pay | Admitting: Family Medicine

## 2014-03-23 ENCOUNTER — Other Ambulatory Visit: Payer: Self-pay | Admitting: Family Medicine

## 2014-03-23 MED ORDER — CEFDINIR 300 MG PO CAPS: 300.0000 mg | ORAL_CAPSULE | Freq: Two times a day (BID) | ORAL | Status: DC

## 2014-03-23 NOTE — Telephone Encounter (Signed)
Pt called and needs a refill on her medication for her kidney infection. Symptoms are coming back.

## 2014-03-23 NOTE — Telephone Encounter (Signed)
Please advise 

## 2014-03-23 NOTE — Telephone Encounter (Signed)
OK. Cefdinir rx sent to her pharmacy. If still with sx's after this 7d course of abx, she needs to come in for o/v and give urine sample.-thx

## 2014-03-23 NOTE — Telephone Encounter (Signed)
Patient aware.  She has a follow up scheduled soon anyway.

## 2014-03-27 ENCOUNTER — Other Ambulatory Visit: Payer: Self-pay

## 2014-03-27 ENCOUNTER — Ambulatory Visit: Payer: BC Managed Care – PPO | Admitting: Family Medicine

## 2014-03-27 MED ORDER — ENALAPRIL MALEATE 2.5 MG PO TABS: 2.5000 mg | ORAL_TABLET | Freq: Every day | ORAL | Status: DC

## 2014-03-27 MED ORDER — METFORMIN HCL ER 500 MG PO TB24: ORAL_TABLET | ORAL | Status: DC

## 2014-04-04 ENCOUNTER — Encounter: Payer: Self-pay | Admitting: Family Medicine

## 2014-04-04 ENCOUNTER — Ambulatory Visit (INDEPENDENT_AMBULATORY_CARE_PROVIDER_SITE_OTHER): Payer: BLUE CROSS/BLUE SHIELD | Admitting: Family Medicine

## 2014-04-04 VITALS — BP 123/87 | HR 84 | Temp 97.8°F | Resp 18 | Ht 62.75 in | Wt 215.0 lb

## 2014-04-04 DIAGNOSIS — E785 Hyperlipidemia, unspecified: Secondary | ICD-10-CM

## 2014-04-04 DIAGNOSIS — E119 Type 2 diabetes mellitus without complications: Secondary | ICD-10-CM

## 2014-04-04 DIAGNOSIS — I10 Essential (primary) hypertension: Secondary | ICD-10-CM

## 2014-04-04 LAB — LIPID PANEL
CHOL/HDL RATIO: 4
Cholesterol: 188 mg/dL (ref 0–200)
HDL: 45.7 mg/dL (ref >39.00)
LDL Cholesterol: 116 mg/dL — ABNORMAL HIGH (ref 0–99)
NonHDL: 142.3
TRIGLYCERIDES: 130 mg/dL (ref 0.0–149.0)
VLDL: 26 mg/dL (ref 0.0–40.0)

## 2014-04-04 LAB — HEMOGLOBIN A1C: HEMOGLOBIN A1C: 8.3 % — AB (ref 4.6–6.5)

## 2014-04-04 NOTE — Progress Notes (Signed)
Pre visit review using our clinic review tool, if applicable. No additional management support is needed unless otherwise documented below in the visit note. 

## 2014-04-04 NOTE — Progress Notes (Signed)
OFFICE NOTE  04/04/2014  CC:  Chief Complaint  Patient presents with  . Follow-up    fasting   HPI: Patient is a 44 y.o. Caucasian female who is here for 4 mo f/u DM 2, HTN, hyperlipidemia. Feeling fine.  Got her hysterectomy since last visit.  She has a post-op f/u with her GYN 21/25/16. Glucoses around 100 per pt. No bp checks at home.   Pertinent PMH:  Past medical, surgical, social, and family history reviewed and no changes are noted since last office visit.  MEDS:  Outpatient Prescriptions Prior to Visit  Medication Sig Dispense Refill  . cholecalciferol (VITAMIN D) 1000 UNITS tablet Take 1,000 Units by mouth daily.    . clobetasol (OLUX) 0.05 % topical foam Apply 1 application topically as needed. For sensitive sclap    . enalapril (VASOTEC) 2.5 MG tablet Take 1 tablet (2.5 mg total) by mouth daily. 90 tablet 0  . fish oil-omega-3 fatty acids 1000 MG capsule Take 1 g by mouth daily.    . Liraglutide -Weight Management (SAXENDA) 6 MG/ML SOPN Inject into the skin. Per wt mgmt clinic--09/2013    . metFORMIN (GLUCOPHAGE-XR) 500 MG 24 hr tablet 2 tabs po bid 120 tablet 0  . Multiple Vitamins-Minerals (MULTIVITAMIN WITH MINERALS) tablet Take 1 tablet by mouth daily.    . simvastatin (ZOCOR) 20 MG tablet TAKE 1 TABLET (20 MG TOTAL) BY MOUTH DAILY. 30 tablet 3  . Blood Glucose Monitoring Suppl (ONE TOUCH ULTRA SYSTEM KIT) W/DEVICE KIT Check glucose twice per day 1 each 6  . glucose blood test strip Use as instructed 100 each 12  . ONE TOUCH LANCETS MISC Check blood sugar twice daily, as needed 100 each 12  . cefdinir (OMNICEF) 300 MG capsule Take 1 capsule (300 mg total) by mouth 2 (two) times daily. 14 capsule 0   No facility-administered medications prior to visit.    PE: Blood pressure 123/87, pulse 84, temperature 97.8 F (36.6 C), temperature source Temporal, resp. rate 18, height 5' 2.75" (1.594 m), weight 215 lb (97.523 kg), SpO2 98 %. Gen: Alert, well appearing.   Patient is oriented to person, place, time, and situation. AFFECT: pleasant, lucid thought and speech. No further exam today.  LAB: none today RECENT:  Lab Results  Component Value Date   CHOL 178 07/15/2013   HDL 51.40 07/15/2013   LDLCALC 103* 07/15/2013   TRIG 119.0 07/15/2013   CHOLHDL 3 07/15/2013   Lab Results  Component Value Date   HGBA1C 6.9* 11/23/2013     Chemistry      Component Value Date/Time   NA 136 07/15/2013 0941   K 4.8 07/15/2013 0941   CL 106 07/15/2013 0941   CO2 23 07/15/2013 0941   BUN 14 07/15/2013 0941   CREATININE 0.6 07/15/2013 0941   CREATININE 0.71 07/16/2012 1546      Component Value Date/Time   CALCIUM 9.0 07/15/2013 0941   ALKPHOS 69 07/15/2013 0941   AST 23 07/15/2013 0941   ALT 33 07/15/2013 0941   BILITOT 0.7 07/15/2013 0941       IMPRESSION AND PLAN:  1) DM 2, good control. HbA1c today.  All other monitoring UTD. She repeatedly declines pneumovax 23 and influenza vaccines.  2) Hyperlipidemia: LDL 103 last check 7 months ago. FLP today.  Tolerating simvastatin 43m qd.  3) HTN; The current medical regimen is effective;  continue present plan and medications. Will wait and repeat chemistry panel + hepatic panel at next f/u  in 4 mo since these were normal 7 mo ago.  An After Visit Summary was printed and given to the patient.  FOLLOW UP: 4 mo

## 2014-04-05 ENCOUNTER — Telehealth: Payer: Self-pay | Admitting: Family Medicine

## 2014-04-05 ENCOUNTER — Other Ambulatory Visit: Payer: Self-pay | Admitting: Family Medicine

## 2014-04-05 MED ORDER — PIOGLITAZONE HCL 15 MG PO TABS: ORAL_TABLET | ORAL | Status: DC

## 2014-04-05 NOTE — Telephone Encounter (Signed)
emmi emailed °

## 2014-04-06 ENCOUNTER — Other Ambulatory Visit: Payer: Self-pay | Admitting: Family Medicine

## 2014-04-06 MED ORDER — SIMVASTATIN 40 MG PO TABS: 40.0000 mg | ORAL_TABLET | Freq: Every day | ORAL | Status: DC

## 2014-04-26 ENCOUNTER — Other Ambulatory Visit: Payer: Self-pay | Admitting: Family Medicine

## 2014-04-26 MED ORDER — METFORMIN HCL ER 500 MG PO TB24: ORAL_TABLET | ORAL | Status: DC

## 2014-05-01 ENCOUNTER — Telehealth: Payer: Self-pay | Admitting: Family Medicine

## 2014-05-01 NOTE — Telephone Encounter (Signed)
Simvastatin 20mg  tablet- Last filled: 1.16.16

## 2014-05-01 NOTE — Telephone Encounter (Signed)
Called pt and left message as per Dr. Willadean Carol

## 2014-05-01 NOTE — Telephone Encounter (Signed)
Pls call pt and remind her that after her last cholesterol check about a month ago I increased her simvastatin to 40mg  once a day.  At that time I sent in 40mg  tabs with instructions to take 1 per day, RF x 6.  Therefore, she just needs to request RF of the 40mg  simvastatin from her pharmacy.-thx

## 2014-06-22 ENCOUNTER — Ambulatory Visit (INDEPENDENT_AMBULATORY_CARE_PROVIDER_SITE_OTHER): Payer: BLUE CROSS/BLUE SHIELD | Admitting: Nurse Practitioner

## 2014-06-22 ENCOUNTER — Telehealth: Payer: Self-pay | Admitting: Nurse Practitioner

## 2014-06-22 ENCOUNTER — Ambulatory Visit (HOSPITAL_BASED_OUTPATIENT_CLINIC_OR_DEPARTMENT_OTHER)
Admission: RE | Admit: 2014-06-22 | Discharge: 2014-06-22 | Disposition: A | Discharge status: Home / Self Care | Payer: BLUE CROSS/BLUE SHIELD | Source: Ambulatory Visit | Attending: Nurse Practitioner | Admitting: Nurse Practitioner

## 2014-06-22 ENCOUNTER — Encounter: Payer: Self-pay | Admitting: Nurse Practitioner

## 2014-06-22 VITALS — BP 140/88 | HR 122 | Temp 101.1°F | Ht 62.75 in | Wt 212.0 lb

## 2014-06-22 DIAGNOSIS — R05 Cough: Secondary | ICD-10-CM

## 2014-06-22 DIAGNOSIS — R509 Fever, unspecified: Secondary | ICD-10-CM

## 2014-06-22 DIAGNOSIS — J189 Pneumonia, unspecified organism: Secondary | ICD-10-CM

## 2014-06-22 DIAGNOSIS — R059 Cough, unspecified: Secondary | ICD-10-CM

## 2014-06-22 MED ORDER — LEVOFLOXACIN 750 MG PO TABS: 750.0000 mg | ORAL_TABLET | Freq: Every day | ORAL | Status: DC

## 2014-06-22 MED ORDER — ALBUTEROL SULFATE HFA 108 (90 BASE) MCG/ACT IN AERS: INHALATION_SPRAY | RESPIRATORY_TRACT | Status: DC

## 2014-06-22 MED ORDER — HYDROCODONE-HOMATROPINE 5-1.5 MG/5ML PO SYRP: 5.0000 mL | ORAL_SOLUTION | Freq: Every evening | ORAL | Status: DC | PRN

## 2014-06-22 MED ORDER — GUAIFENESIN ER 600 MG PO TB12: 600.0000 mg | ORAL_TABLET | Freq: Two times a day (BID) | ORAL | Status: DC

## 2014-06-22 NOTE — Telephone Encounter (Signed)
pls call pt: Advise She has pneumonia in both lung bases Start levaquin. Take every 24 hours. Also use inhaler as directed-shake before using. Deep cough after use. Rest, stay hydrated. Check blood sugars-it may run higher while running fever, so may need to watch diet more carefully-no sugar except fresh fruit.  pls make sure she has f/u appt in 1 week.

## 2014-06-22 NOTE — Progress Notes (Signed)
Pre visit review using our clinic review tool, if applicable. No additional management support is needed unless otherwise documented below in the visit note. 

## 2014-06-22 NOTE — Patient Instructions (Signed)
I am suspicious for penumonia, please get chest xray. My office will call with chest xray results and follow up. For nasal congestion: Start daily sinus rinses (neilmed Sinus Rinse).  Vicks vapor rub under nose to help breathe. For sore throat: Benzocaine throat lozenges for sore throat. For cough:  Use cough syrup at night. Use guaifenesen 600 mg twice daily for about 7 days. Sip fluids to stay hydrated. Tylenol or ibuprophen for headache & fever. Rest.

## 2014-06-22 NOTE — Progress Notes (Signed)
   Subjective:    Patient ID: Kristen Meyers, female    DOB: 12-02-1970, 44 y.o.   MRN: [id number]  Cough This is a new problem. The current episode started 1 to 4 weeks ago (2 wks). The problem has been gradually worsening. The problem occurs every few minutes. The cough is productive of sputum. Associated symptoms include chest pain (w/coughing), chills, a fever (yesterday), headaches (3d), myalgias, nasal congestion, postnasal drip and a sore throat. Pertinent negatives include no ear congestion, ear pain, hemoptysis, shortness of breath or wheezing. Nothing aggravates the symptoms. She has tried OTC cough suppressant (NSAIDS) for the symptoms. The treatment provided mild relief. diabetes      Review of Systems  Constitutional: Positive for fever (yesterday) and chills.  HENT: Positive for postnasal drip and sore throat. Negative for ear pain.   Respiratory: Positive for cough. Negative for hemoptysis, shortness of breath and wheezing.   Cardiovascular: Positive for chest pain (w/coughing).  Musculoskeletal: Positive for myalgias.  Neurological: Positive for headaches (3d).       Objective:   Physical Exam  Constitutional: She is oriented to person, place, and time. She appears well-developed and well-nourished. No distress.  Looks tired. fever   HENT:  Head: Normocephalic and atraumatic.  Right Ear: External ear normal.  Left Ear: External ear normal.  Mouth/Throat: Oropharynx is clear and moist. No oropharyngeal exudate.  Eyes: Conjunctivae are normal. Right eye exhibits no discharge. Left eye exhibits no discharge.  Neck: Normal range of motion. No thyromegaly present.  Cardiovascular: Regular rhythm and normal heart sounds.   No murmur heard. tachycardia  Pulmonary/Chest: Effort normal and breath sounds normal. She has no wheezes.  Lymphadenopathy:    She has cervical adenopathy (anterior cervical LAD).  Neurological: She is alert and oriented to person, place, and  time.  Skin: Skin is warm and dry.  Psychiatric: She has a normal mood and affect. Her behavior is normal. Thought content normal.  Vitals reviewed.         Assessment & Plan:  1. Cough Suspicious of pneumonia due to development of fever yesterday  - guaiFENesin (MUCINEX) 600 MG 12 hr tablet; Take 1 tablet (600 mg total) by mouth 2 (two) times daily.  Dispense: 30 tablet; Refill: 0 - HYDROcodone-homatropine (HYCODAN) 5-1.5 MG/5ML syrup; Take 5 mLs by mouth at bedtime as needed for cough.  Dispense: 120 mL; Refill: 0 - DG Chest 2 View; Future  2. Fever, unspecified fever cause - DG Chest 2 View; Future  Will prescribe ABX after reviewing CXR results See pt instructions

## 2014-06-22 NOTE — Telephone Encounter (Signed)
Called and informed patient of results. Patient already has f/u appt next week 4/14

## 2014-06-26 ENCOUNTER — Other Ambulatory Visit: Payer: Self-pay | Admitting: *Deleted

## 2014-06-26 MED ORDER — ENALAPRIL MALEATE 2.5 MG PO TABS: 2.5000 mg | ORAL_TABLET | Freq: Every day | ORAL | Status: DC

## 2014-06-29 ENCOUNTER — Encounter: Payer: Self-pay | Admitting: Family Medicine

## 2014-06-29 ENCOUNTER — Ambulatory Visit (INDEPENDENT_AMBULATORY_CARE_PROVIDER_SITE_OTHER): Payer: BLUE CROSS/BLUE SHIELD | Admitting: Family Medicine

## 2014-06-29 VITALS — BP 118/84 | HR 85 | Temp 98.3°F | Ht 62.75 in | Wt 211.0 lb

## 2014-06-29 DIAGNOSIS — J189 Pneumonia, unspecified organism: Secondary | ICD-10-CM | POA: Diagnosis not present

## 2014-06-29 NOTE — Progress Notes (Signed)
OFFICE NOTE  06/29/2014  CC:  Chief Complaint  Patient presents with  . Follow-up    cough     HPI: Patient is a 44 y.o. Caucasian female who is here for 1 week f/u respiratory illness, was seen 06/22/14 by Nicky Pugh, FNP.   Pneumonia was suspected, CXR obtained and it showed bibasilar atelectasis but o/w normal. She was started on levaquin and albut inhaler. Feeling much better, still with some cough and feeling tired.  Finished 5d of levaquin. No recent fevers.  Requiring alb inhaler less and less.  No cough med in 2d, takes cough drops occ still.   Pertinent PMH:  Past medical, surgical, social, and family history reviewed and no changes are noted since last office visit.  MEDS:  Outpatient Prescriptions Prior to Visit  Medication Sig Dispense Refill  . albuterol (PROVENTIL HFA;VENTOLIN HFA) 108 (90 BASE) MCG/ACT inhaler 2 puffs BID X 4 days, then BID PRN 1 Inhaler 0  . Blood Glucose Monitoring Suppl (ONE TOUCH ULTRA SYSTEM KIT) W/DEVICE KIT Check glucose twice per day 1 each 6  . cholecalciferol (VITAMIN D) 1000 UNITS tablet Take 1,000 Units by mouth daily.    . clobetasol (OLUX) 0.05 % topical foam Apply 1 application topically as needed. For sensitive sclap    . enalapril (VASOTEC) 2.5 MG tablet Take 1 tablet (2.5 mg total) by mouth daily. 90 tablet 1  . fish oil-omega-3 fatty acids 1000 MG capsule Take 1 g by mouth daily.    Marland Kitchen glucose blood test strip Use as instructed 100 each 12  . HYDROcodone-homatropine (HYCODAN) 5-1.5 MG/5ML syrup Take 5 mLs by mouth at bedtime as needed for cough. 120 mL 0  . Liraglutide -Weight Management (SAXENDA) 6 MG/ML SOPN Inject into the skin. Per wt mgmt clinic--09/2013    . metFORMIN (GLUCOPHAGE-XR) 500 MG 24 hr tablet 2 tabs po bid 120 tablet 3  . Multiple Vitamins-Minerals (MULTIVITAMIN WITH MINERALS) tablet Take 1 tablet by mouth daily.    . ONE TOUCH LANCETS MISC Check blood sugar twice daily, as needed 100 each 12  . simvastatin (ZOCOR)  40 MG tablet Take 1 tablet (40 mg total) by mouth at bedtime. 30 tablet 6  . pioglitazone (ACTOS) 15 MG tablet 1-2 tabs po qd as per MD's instructions (Patient not taking: Reported on 06/29/2014) 60 tablet 6  . guaiFENesin (MUCINEX) 600 MG 12 hr tablet Take 1 tablet (600 mg total) by mouth 2 (two) times daily. (Patient not taking: Reported on 06/29/2014) 30 tablet 0  . levofloxacin (LEVAQUIN) 750 MG tablet Take 1 tablet (750 mg total) by mouth daily. (Patient not taking: Reported on 06/29/2014) 5 tablet 0   No facility-administered medications prior to visit.    PE: Blood pressure 118/84, pulse 85, temperature 98.3 F (36.8 C), temperature source Oral, height 5' 2.75" (1.594 m), weight 211 lb (95.709 kg), last menstrual period 11/29/2013, SpO2 98 %. Gen: Alert, well appearing.  Patient is oriented to person, place, time, and situation. AFFECT: pleasant, lucid thought and speech. No further exam today.  IMPRESSION AND PLAN:  CAP; resolving appropriately. Reviewed pt's recent CXR today; very subtle atelectatic vs infiltrate changes in both bases, poor inspiration/low volume.  No need for repeat CXR. REassured pt.  No new meds.  Increase activity and diet as tolerated.  An After Visit Summary was printed and given to the patient.  FOLLOW UP:  Has appt with me set for next f/u routine f/u

## 2014-06-29 NOTE — Progress Notes (Signed)
Pre visit review using our clinic review tool, if applicable. No additional management support is needed unless otherwise documented below in the visit note. 

## 2014-07-29 IMAGING — CR DG THORACIC SPINE 2V
2 series · 2 of 2 positions shown · non-contrast
Comparison: None

CLINICAL DATA: Cervical radiculitis.  Back pain

THORACIC SPINE - 2 VIEW

[t t-spine a.p.]
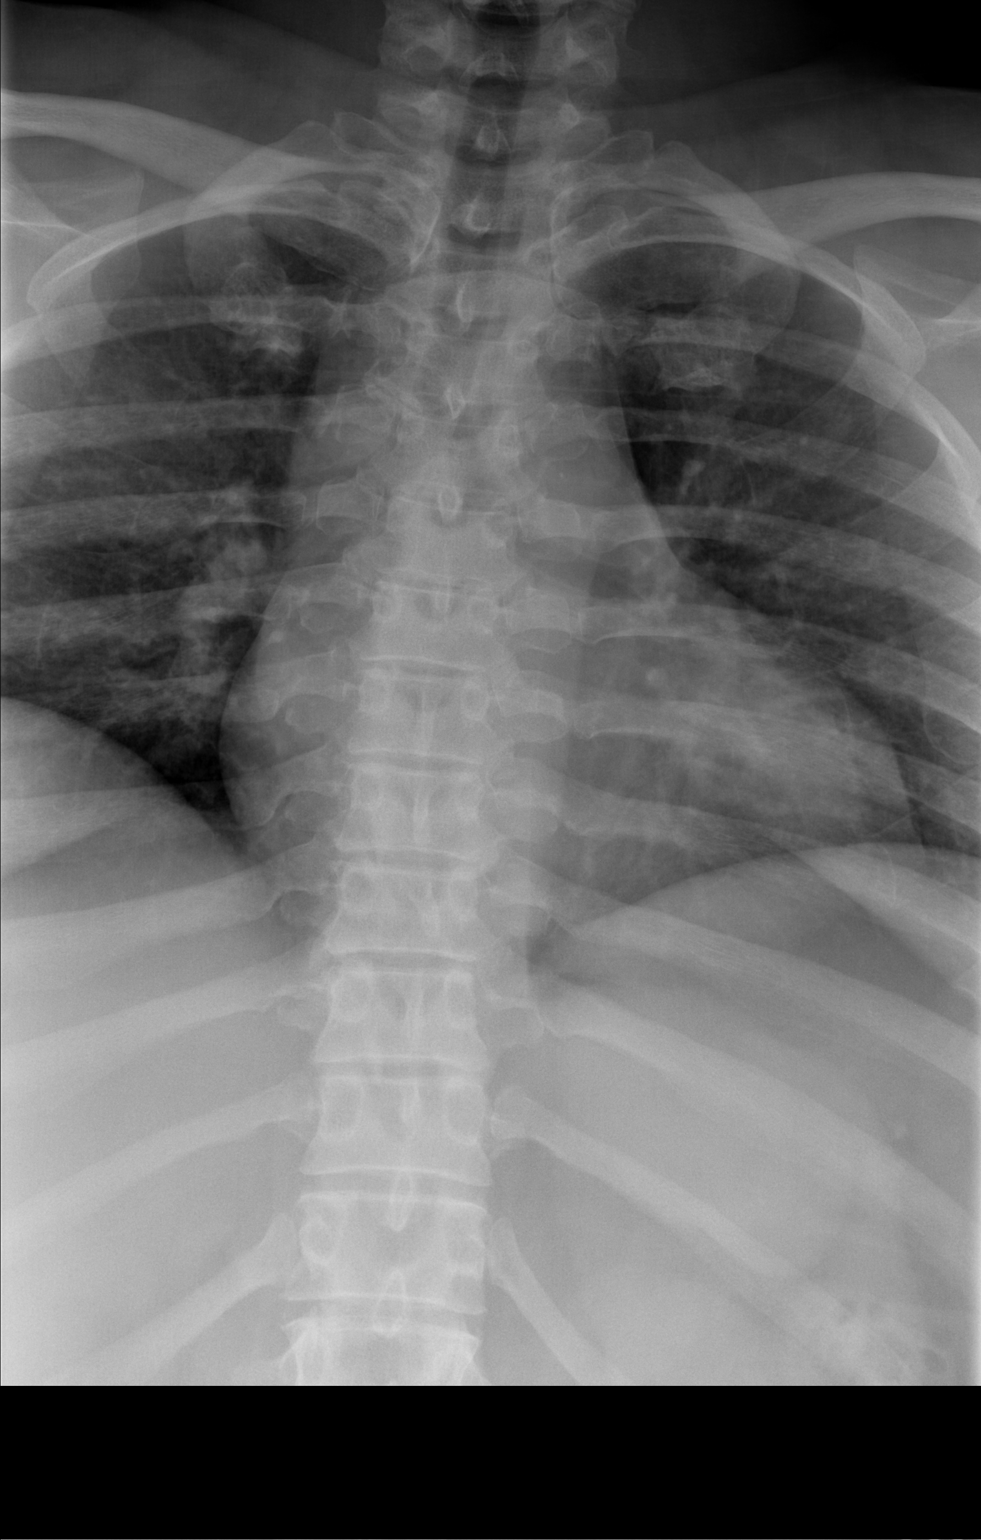

[t t-spine lat]
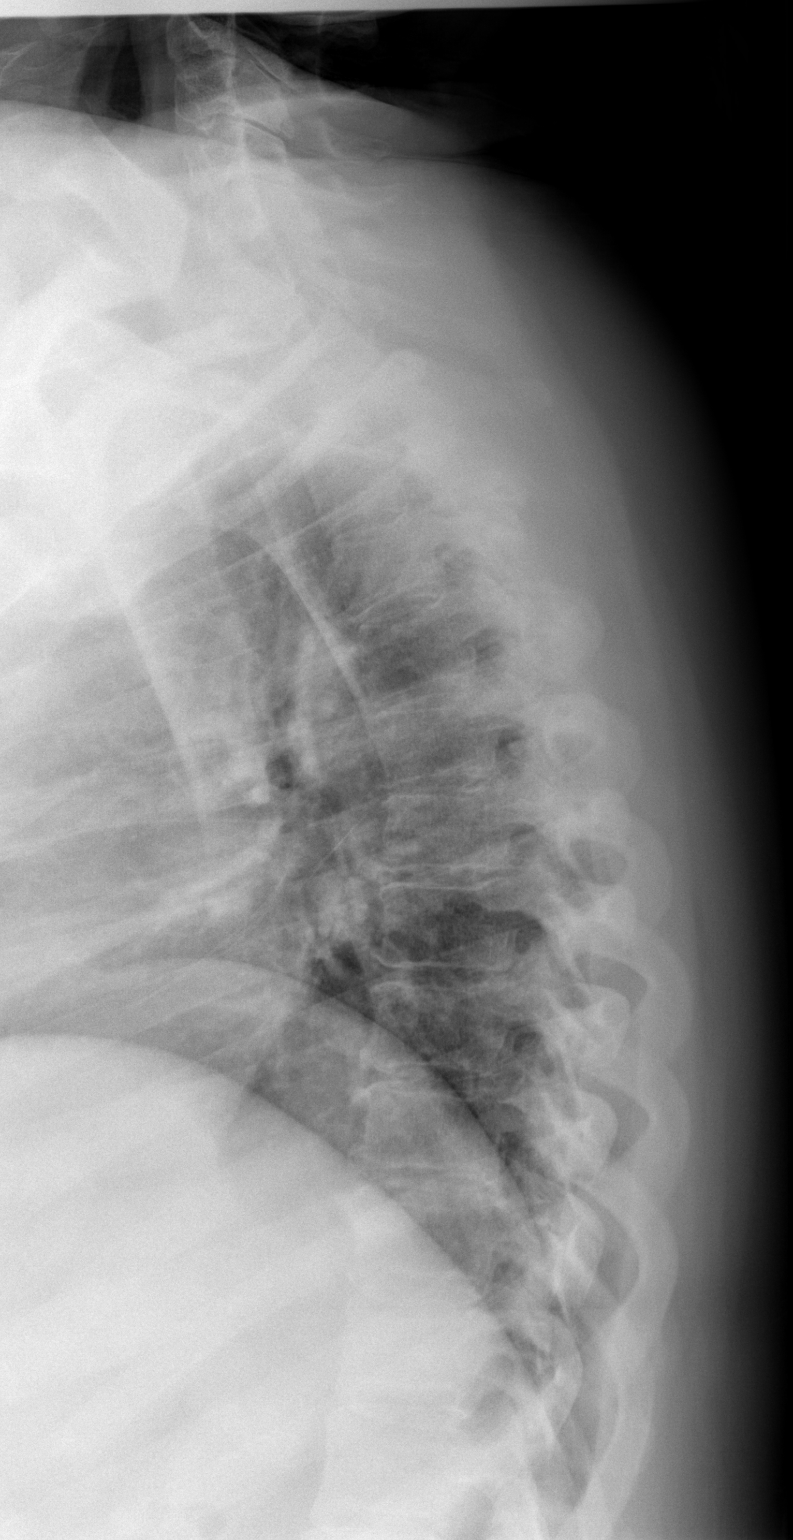

[2 of 2 positions shown; findings below may reference images not displayed]

FINDINGS: Negative for fracture.  Mild disc degeneration and mild
spurring in the mid and lower thoracic spine.  No focal bony lesion
is identified.
IMPRESSION: Mild disc degeneration and spurring.  No acute abnormality.

## 2014-08-04 ENCOUNTER — Encounter: Payer: Self-pay | Admitting: Family Medicine

## 2014-08-04 ENCOUNTER — Ambulatory Visit (INDEPENDENT_AMBULATORY_CARE_PROVIDER_SITE_OTHER): Payer: BLUE CROSS/BLUE SHIELD | Admitting: Family Medicine

## 2014-08-04 VITALS — BP 132/89 | HR 74 | Temp 98.1°F | Resp 16 | Wt 215.0 lb

## 2014-08-04 DIAGNOSIS — E119 Type 2 diabetes mellitus without complications: Secondary | ICD-10-CM | POA: Diagnosis not present

## 2014-08-04 DIAGNOSIS — I10 Essential (primary) hypertension: Secondary | ICD-10-CM

## 2014-08-04 DIAGNOSIS — E785 Hyperlipidemia, unspecified: Secondary | ICD-10-CM | POA: Diagnosis not present

## 2014-08-04 LAB — COMPREHENSIVE METABOLIC PANEL
ALBUMIN: 3.9 g/dL (ref 3.5–5.2)
ALT: 32 U/L (ref 0–35)
AST: 20 U/L (ref 0–37)
Alkaline Phosphatase: 84 U/L (ref 39–117)
BILIRUBIN TOTAL: 0.4 mg/dL (ref 0.2–1.2)
BUN: 14 mg/dL (ref 6–23)
CALCIUM: 8.9 mg/dL (ref 8.4–10.5)
CO2: 24 mEq/L (ref 19–32)
Chloride: 102 mEq/L (ref 96–112)
Creat: 0.63 mg/dL (ref 0.50–1.10)
Glucose, Bld: 147 mg/dL — ABNORMAL HIGH (ref 70–99)
Potassium: 4.1 mEq/L (ref 3.5–5.3)
Sodium: 135 mEq/L (ref 135–145)
Total Protein: 6.7 g/dL (ref 6.0–8.3)

## 2014-08-04 NOTE — Progress Notes (Signed)
Pre visit review using our clinic review tool, if applicable. No additional management support is needed unless otherwise documented below in the visit note. 

## 2014-08-04 NOTE — Progress Notes (Signed)
OFFICE NOTE  08/04/2014  CC:  Chief Complaint  Patient presents with  . Follow-up    4 month follow up. Pt is not fasting.    HPI: Patient is a 44 y.o. Caucasian female who is here for 4 mo f/u DM 2, HTN, hyperlipidemia. She stopped her saxenda only 3-4 wks ago due to it no longer helping with wt loss plus excessive nausea. Unclear why but hasn't started pioglitazone yet.  Not checking home glucoses.  Has supplies. No polyuria or polydipsia.  Tolerating increased dose of zocor since last visit.  No bp monitoring at home.  Pertinent PMH:  Past medical, surgical, social, and family history reviewed and no changes are noted since last office visit.  MEDS:  Outpatient Prescriptions Prior to Visit  Medication Sig Dispense Refill  . Blood Glucose Monitoring Suppl (ONE TOUCH ULTRA SYSTEM KIT) W/DEVICE KIT Check glucose twice per day 1 each 6  . cholecalciferol (VITAMIN D) 1000 UNITS tablet Take 1,000 Units by mouth daily.    . clobetasol (OLUX) 0.05 % topical foam Apply 1 application topically as needed. For sensitive sclap    . enalapril (VASOTEC) 2.5 MG tablet Take 1 tablet (2.5 mg total) by mouth daily. 90 tablet 1  . fish oil-omega-3 fatty acids 1000 MG capsule Take 1 g by mouth daily.    Marland Kitchen glucose blood test strip Use as instructed 100 each 12  . metFORMIN (GLUCOPHAGE-XR) 500 MG 24 hr tablet 2 tabs po bid 120 tablet 3  . Multiple Vitamins-Minerals (MULTIVITAMIN WITH MINERALS) tablet Take 1 tablet by mouth daily.    . ONE TOUCH LANCETS MISC Check blood sugar twice daily, as needed 100 each 12  . simvastatin (ZOCOR) 40 MG tablet Take 1 tablet (40 mg total) by mouth at bedtime. 30 tablet 6  . albuterol (PROVENTIL HFA;VENTOLIN HFA) 108 (90 BASE) MCG/ACT inhaler 2 puffs BID X 4 days, then BID PRN (Patient not taking: Reported on 08/04/2014) 1 Inhaler 0  . HYDROcodone-homatropine (HYCODAN) 5-1.5 MG/5ML syrup Take 5 mLs by mouth at bedtime as needed for cough. (Patient not taking:  Reported on 08/04/2014) 120 mL 0  . Liraglutide -Weight Management (SAXENDA) 6 MG/ML SOPN Inject into the skin. Per wt mgmt clinic--09/2013    . pioglitazone (ACTOS) 15 MG tablet 1-2 tabs po qd as per MD's instructions (Patient not taking: Reported on 06/29/2014) 60 tablet 6   No facility-administered medications prior to visit.    PE: Blood pressure 132/89, pulse 74, temperature 98.1 F (36.7 C), temperature source Oral, resp. rate 16, weight 215 lb (97.523 kg), last menstrual period 11/29/2013, SpO2 100 %. Gen: Alert, well appearing.  Patient is oriented to person, place, time, and situation. AFFECT: pleasant, lucid thought and speech. No further exam today.  LABS:  Lab Results  Component Value Date   HGBA1C 8.3* 04/04/2014   Lab Results  Component Value Date   CHOL 188 04/04/2014   HDL 45.70 04/04/2014   LDLCALC 116* 04/04/2014   TRIG 130.0 04/04/2014   CHOLHDL 4 04/04/2014     Chemistry      Component Value Date/Time   NA 136 07/15/2013 0941   K 4.8 07/15/2013 0941   CL 106 07/15/2013 0941   CO2 23 07/15/2013 0941   BUN 14 07/15/2013 0941   CREATININE 0.6 07/15/2013 0941   CREATININE 0.71 07/16/2012 1546      Component Value Date/Time   CALCIUM 9.0 07/15/2013 0941   ALKPHOS 69 07/15/2013 0941   AST 23 07/15/2013  0941   ALT 33 07/15/2013 0941   BILITOT 0.7 07/15/2013 0941        IMPRESSION AND PLAN:  1) DM 2, hx poor control.  Despite getting off her saxenda AND not starting her pioglitazone, patient really wanted to try JUST diet/exercise and metformin at this time.  I told her to call in 1 mo if these efforts did not result in glucoses consistently <160 fasting or < 200 2H PP.  To add to this, she has historically not been compliant with her meds (forgets to take med avg of 2 days per week).   Check A1c and CMET today.  2) HTN; control is fine.  No home monitoring to compare to, though. Lytes/cr today.  No med changes.  3) Hyperlipidemia: tolerating  increased dose (90m qd) of zocor. Not fasting today. We'll have her return in 3 mo for fasting appt and recheck FLP at that time.  An After Visit Summary was printed and given to the patient.  FOLLOW UP: 3 mo-fasting

## 2014-08-05 LAB — HEMOGLOBIN A1C
HEMOGLOBIN A1C: 7.6 % — AB (ref <5.7)
Mean Plasma Glucose: 171 mg/dL — ABNORMAL HIGH (ref <117)

## 2014-09-21 ENCOUNTER — Encounter: Payer: Self-pay | Admitting: *Deleted

## 2014-09-22 ENCOUNTER — Other Ambulatory Visit: Payer: Self-pay | Admitting: Family Medicine

## 2014-09-22 MED ORDER — ENALAPRIL MALEATE 2.5 MG PO TABS: 2.5000 mg | ORAL_TABLET | Freq: Every day | ORAL | Status: DC

## 2014-11-03 ENCOUNTER — Ambulatory Visit (INDEPENDENT_AMBULATORY_CARE_PROVIDER_SITE_OTHER): Payer: BLUE CROSS/BLUE SHIELD | Admitting: Family Medicine

## 2014-11-03 ENCOUNTER — Encounter: Payer: Self-pay | Admitting: Family Medicine

## 2014-11-03 VITALS — BP 130/89 | HR 76 | Temp 98.3°F | Resp 16 | Ht 62.75 in | Wt 219.0 lb

## 2014-11-03 DIAGNOSIS — E119 Type 2 diabetes mellitus without complications: Secondary | ICD-10-CM

## 2014-11-03 DIAGNOSIS — E785 Hyperlipidemia, unspecified: Secondary | ICD-10-CM | POA: Diagnosis not present

## 2014-11-03 DIAGNOSIS — I10 Essential (primary) hypertension: Secondary | ICD-10-CM | POA: Diagnosis not present

## 2014-11-03 LAB — LIPID PANEL
Cholesterol: 224 mg/dL — ABNORMAL HIGH (ref 0–200)
HDL: 43.5 mg/dL (ref >39.00)
NONHDL: 180.59
Total CHOL/HDL Ratio: 5
Triglycerides: 201 mg/dL — ABNORMAL HIGH (ref 0.0–149.0)
VLDL: 40.2 mg/dL — ABNORMAL HIGH (ref 0.0–40.0)

## 2014-11-03 LAB — LDL CHOLESTEROL, DIRECT: LDL DIRECT: 157 mg/dL

## 2014-11-03 LAB — HEMOGLOBIN A1C: HEMOGLOBIN A1C: 8.8 % — AB (ref 4.6–6.5)

## 2014-11-03 MED ORDER — ENALAPRIL MALEATE 2.5 MG PO TABS: 2.5000 mg | ORAL_TABLET | Freq: Every day | ORAL | Status: DC

## 2014-11-03 NOTE — Progress Notes (Signed)
OFFICE VISIT  11/03/2014   CC:  Chief Complaint  Patient presents with  . Follow-up    3 month f/u, pt is fasting.    HPI:    Patient is a 44 y.o. Caucasian female who presents for 3 mo f/u DM 2, HTN, HLD. No home bp checks.  Out of enalapril x 1 mo. Avg gluc about 140, highest 205. Feeling well, fasting today. Statin avg 3-4 days per week b/c of cramps.  Has eye exam for D.R   Set for end of this month.  Past Medical History  Diagnosis Date  . Diabetes mellitus without complication 04/23/5168  . HYPERLIPIDEMIA 03/21/2010  . HYPERTENSION 03/21/2010  . GERD 03/21/2010  . Fatty liver 09/19/09 (u/s)    Mildly elevated transaminases  . Cholelithiasis without obstruction 09/19/09 (u/s)  . DEEP VENOUS THROMBOPHLEBITIS, HX OF 03/21/2010    Qualifier: Diagnosis of  By: Elease Hashimoto MD, Bruce    . History of herpes zoster 03/2012    Past Surgical History  Procedure Laterality Date  . Cesarean section      2003, 2005  . Tubal ligation  2011    Dr. Reino Kent is her GYN.  . Laparoscopic hysterectomy  02/2014    Ovaries remain (tubes out): done for DUB    Outpatient Prescriptions Prior to Visit  Medication Sig Dispense Refill  . Blood Glucose Monitoring Suppl (ONE TOUCH ULTRA SYSTEM KIT) W/DEVICE KIT Check glucose twice per day 1 each 6  . cholecalciferol (VITAMIN D) 1000 UNITS tablet Take 1,000 Units by mouth daily.    . clobetasol (OLUX) 0.05 % topical foam Apply 1 application topically as needed. For sensitive sclap    . fish oil-omega-3 fatty acids 1000 MG capsule Take 1 g by mouth daily.    Marland Kitchen glucose blood test strip Use as instructed 100 each 12  . metFORMIN (GLUCOPHAGE-XR) 500 MG 24 hr tablet 2 tabs po bid 120 tablet 3  . Multiple Vitamins-Minerals (MULTIVITAMIN WITH MINERALS) tablet Take 1 tablet by mouth daily.    . ONE TOUCH LANCETS MISC Check blood sugar twice daily, as needed 100 each 12  . simvastatin (ZOCOR) 40 MG tablet Take 1 tablet (40 mg total) by mouth at bedtime. 30 tablet 6   . enalapril (VASOTEC) 2.5 MG tablet Take 1 tablet (2.5 mg total) by mouth daily. 90 tablet 1  . albuterol (PROVENTIL HFA;VENTOLIN HFA) 108 (90 BASE) MCG/ACT inhaler 2 puffs BID X 4 days, then BID PRN (Patient not taking: Reported on 08/04/2014) 1 Inhaler 0   No facility-administered medications prior to visit.    Allergies  Allergen Reactions  . Codeine Hives    ROS As per HPI  PE: Blood pressure 130/89, pulse 76, temperature 98.3 F (36.8 C), temperature source Oral, resp. rate 16, height 5' 2.75" (1.594 m), weight 219 lb (99.338 kg), last menstrual period 11/29/2013, SpO2 97 %. Gen: Alert, well appearing.  Patient is oriented to person, place, time, and situation. No further exam today.  LABS:  Lab Results  Component Value Date   HGBA1C 7.6* 08/04/2014    Lab Results  Component Value Date   TSH 1.97 07/15/2013   Lab Results  Component Value Date   WBC 6.3 07/15/2013   HGB 13.3 07/15/2013   HCT 40.1 07/15/2013   MCV 85.5 07/15/2013   PLT 232.0 07/15/2013   Lab Results  Component Value Date   CREATININE 0.63 08/04/2014   BUN 14 08/04/2014   NA 135 08/04/2014   K 4.1  08/04/2014   CL 102 08/04/2014   CO2 24 08/04/2014   Lab Results  Component Value Date   ALT 32 08/04/2014   AST 20 08/04/2014   ALKPHOS 84 08/04/2014   BILITOT 0.4 08/04/2014   Lab Results  Component Value Date   CHOL 188 04/04/2014   Lab Results  Component Value Date   HDL 45.70 04/04/2014   Lab Results  Component Value Date   LDLCALC 116* 04/04/2014   Lab Results  Component Value Date   TRIG 130.0 04/04/2014   Lab Results  Component Value Date   CHOLHDL 4 04/04/2014    IMPRESSION AND PLAN:  1) DM 2; home measurements not ideal.  REcheck A1c today. Has eye exam planned for end of the month. Continue to work on diet/exercise.  2) Hyperlipidemia: tolerating statin 3-4 days a week only, but we'll see how her FLP is today.  3) HTN: bp fine today off med.  No home bp  monitoring still. I made sure to send in her enalapril that she has had trouble getting a her pharmacy for over a month.  An After Visit Summary was printed and given to the patient.   FOLLOW UP: No Follow-up on file.

## 2014-11-03 NOTE — Progress Notes (Signed)
Pre visit review using our clinic review tool, if applicable. No additional management support is needed unless otherwise documented below in the visit note. 

## 2014-11-06 ENCOUNTER — Other Ambulatory Visit: Payer: Self-pay | Admitting: *Deleted

## 2014-11-06 MED ORDER — INSULIN GLARGINE 100 UNIT/ML SOLOSTAR PEN: 10.0000 [IU] | PEN_INJECTOR | Freq: Every day | SUBCUTANEOUS | Status: DC

## 2014-11-30 ENCOUNTER — Other Ambulatory Visit: Payer: Self-pay | Admitting: Obstetrics and Gynecology

## 2014-12-01 LAB — CYTOLOGY - PAP

## 2014-12-13 ENCOUNTER — Other Ambulatory Visit: Payer: Self-pay | Admitting: *Deleted

## 2014-12-13 ENCOUNTER — Telehealth: Payer: Self-pay | Admitting: *Deleted

## 2014-12-13 MED ORDER — METFORMIN HCL ER 500 MG PO TB24: ORAL_TABLET | ORAL | Status: DC

## 2014-12-13 NOTE — Telephone Encounter (Signed)
Yes, she can go back on januvia. Does she need new rx sent in or does she have some meds still from previous rx?

## 2014-12-13 NOTE — Telephone Encounter (Signed)
Pt called and stated that she did not start the lantus and has been working on her diet. She stated that her blood sugars for the day have been running 160-200 with diet and metformin. She wants to know if she can go back on Januvia. She stated that it was stopped because she was taking Saxenda but she is not taking it anymore. Please advise. Thanks.

## 2014-12-13 NOTE — Telephone Encounter (Signed)
RF request for metformin LOV: 11/03/14 Next ov: 03/05/15 Last written: 04/26/14 #120 w/ 1RF

## 2014-12-14 MED ORDER — SITAGLIPTIN PHOSPHATE 100 MG PO TABS: 100.0000 mg | ORAL_TABLET | Freq: Every day | ORAL | Status: DC

## 2014-12-14 NOTE — Telephone Encounter (Signed)
Pt advised and voiced understanding.  She stated that she will need new Rx called into [facility]. Thanks.

## 2014-12-14 NOTE — Telephone Encounter (Signed)
OK.  Januvia eRx'd as per pt request.

## 2015-03-05 ENCOUNTER — Ambulatory Visit: Payer: BLUE CROSS/BLUE SHIELD | Admitting: Family Medicine

## 2015-03-26 ENCOUNTER — Ambulatory Visit: Payer: BLUE CROSS/BLUE SHIELD | Admitting: Family Medicine

## 2015-04-06 ENCOUNTER — Other Ambulatory Visit: Payer: Self-pay | Admitting: *Deleted

## 2015-04-06 MED ORDER — ONETOUCH LANCETS MISC: Status: DC

## 2015-04-06 NOTE — Telephone Encounter (Signed)
RF request for one touch delica lancets LOV: [id number] Next ov: None Last written: 11/24/12 #100 w/ 12RF

## 2015-05-28 ENCOUNTER — Telehealth: Payer: Self-pay | Admitting: *Deleted

## 2015-05-28 MED ORDER — METFORMIN HCL ER 500 MG PO TB24: ORAL_TABLET | ORAL | Status: DC

## 2015-05-28 NOTE — Telephone Encounter (Signed)
RF request for metformin LOV: 11/03/14 Next ov: None Last written: 12/13/14 #120 w/ 3RF  Rx sent for #120 w/ 3RF. Pt needs to schedule office visit within the next 3 months.

## 2015-05-28 NOTE — Telephone Encounter (Signed)
Left detailed message on cell vm, okay per DPR.  

## 2015-06-22 ENCOUNTER — Encounter: Payer: Self-pay | Admitting: Family Medicine

## 2015-06-22 ENCOUNTER — Ambulatory Visit (INDEPENDENT_AMBULATORY_CARE_PROVIDER_SITE_OTHER): Payer: BLUE CROSS/BLUE SHIELD | Admitting: Family Medicine

## 2015-06-22 VITALS — BP 133/82 | HR 77 | Temp 98.3°F | Resp 16 | Ht 62.75 in | Wt 215.0 lb

## 2015-06-22 DIAGNOSIS — I10 Essential (primary) hypertension: Secondary | ICD-10-CM

## 2015-06-22 DIAGNOSIS — E119 Type 2 diabetes mellitus without complications: Secondary | ICD-10-CM | POA: Diagnosis not present

## 2015-06-22 DIAGNOSIS — E785 Hyperlipidemia, unspecified: Secondary | ICD-10-CM | POA: Diagnosis not present

## 2015-06-22 LAB — MICROALBUMIN / CREATININE URINE RATIO
Creatinine,U: 210.4 mg/dL
Microalb Creat Ratio: 0.4 mg/g (ref 0.0–30.0)
Microalb, Ur: 0.8 mg/dL (ref 0.0–1.9)

## 2015-06-22 LAB — COMPREHENSIVE METABOLIC PANEL
ALBUMIN: 4.3 g/dL (ref 3.5–5.2)
ALT: 63 U/L — AB (ref 0–35)
AST: 38 U/L — ABNORMAL HIGH (ref 0–37)
Alkaline Phosphatase: 69 U/L (ref 39–117)
BUN: 16 mg/dL (ref 6–23)
CALCIUM: 9.4 mg/dL (ref 8.4–10.5)
CHLORIDE: 103 meq/L (ref 96–112)
CO2: 27 mEq/L (ref 19–32)
CREATININE: 0.75 mg/dL (ref 0.40–1.20)
GFR: 88.24 mL/min (ref >60.00)
GLUCOSE: 144 mg/dL — AB (ref 70–99)
POTASSIUM: 4.3 meq/L (ref 3.5–5.1)
Sodium: 136 mEq/L (ref 135–145)
TOTAL PROTEIN: 6.8 g/dL (ref 6.0–8.3)
Total Bilirubin: 0.5 mg/dL (ref 0.2–1.2)

## 2015-06-22 LAB — LIPID PANEL
CHOL/HDL RATIO: 5
CHOLESTEROL: 196 mg/dL (ref 0–200)
HDL: 42.8 mg/dL (ref >39.00)
LDL Cholesterol: 130 mg/dL — ABNORMAL HIGH (ref 0–99)
NonHDL: 153.19
TRIGLYCERIDES: 114 mg/dL (ref 0.0–149.0)
VLDL: 22.8 mg/dL (ref 0.0–40.0)

## 2015-06-22 LAB — HEMOGLOBIN A1C: Hgb A1c MFr Bld: 7.7 % — ABNORMAL HIGH (ref 4.6–6.5)

## 2015-06-22 MED ORDER — ONETOUCH LANCETS MISC: Status: DC

## 2015-06-22 MED ORDER — GLUCOSE BLOOD VI STRP: ORAL_STRIP | Status: DC

## 2015-06-22 NOTE — Addendum Note (Signed)
Addended by: Onalee Hua on: 06/22/2015 09:30 AM   Modules accepted: Orders

## 2015-06-22 NOTE — Progress Notes (Signed)
OFFICE VISIT  06/22/2015   CC:  Chief Complaint  Patient presents with  . Follow-up    Pt is fasting.    HPI:    Patient is a 45 y.o. Caucasian female who presents for 8 mo f/u DM 2, HTN, HLD. Instead of adding lantus last visit she preferred to make dietary changes and go back on januvia.  Fasting glucoses gradually coming down into 150-160s. Home bp checks avg <140/90.  Feet: no burning, tingling, or numbness.  No hx of ulcer.  Taking simvastatin 3 times per week---gives her leg cramps if she takes it more than this. She is open to different statin trial if needed in future.    Past Medical History  Diagnosis Date  . Diabetes mellitus without complication ([place]) 03/23/7937  . HYPERLIPIDEMIA 03/21/2010  . HYPERTENSION 03/21/2010  . GERD 03/21/2010  . Fatty liver 09/19/09 (u/s)    Mildly elevated transaminases  . Cholelithiasis without obstruction 09/19/09 (u/s)  . DEEP VENOUS THROMBOPHLEBITIS, HX OF 03/21/2010    Qualifier: Diagnosis of  By: Elease Hashimoto MD, Bruce    . History of herpes zoster 03/2012    Past Surgical History  Procedure Laterality Date  . Cesarean section      2003, 2005  . Tubal ligation  2011    Dr. Reino Kent is her GYN.  . Laparoscopic hysterectomy  02/2014    Ovaries remain (tubes out): done for DUB    Outpatient Prescriptions Prior to Visit  Medication Sig Dispense Refill  . Blood Glucose Monitoring Suppl (ONE TOUCH ULTRA SYSTEM KIT) W/DEVICE KIT Check glucose twice per day 1 each 6  . enalapril (VASOTEC) 2.5 MG tablet Take 1 tablet (2.5 mg total) by mouth daily. 90 tablet 3  . fish oil-omega-3 fatty acids 1000 MG capsule Take 1 g by mouth daily.    Marland Kitchen glucose blood test strip Use as instructed 100 each 12  . metFORMIN (GLUCOPHAGE-XR) 500 MG 24 hr tablet 2 tabs po bid 120 tablet 3  . Multiple Vitamins-Minerals (MULTIVITAMIN WITH MINERALS) tablet Take 1 tablet by mouth daily.    . ONE TOUCH LANCETS MISC Check blood sugar twice daily, as needed 100 each 12  .  simvastatin (ZOCOR) 40 MG tablet Take 1 tablet (40 mg total) by mouth at bedtime. 30 tablet 6  . sitaGLIPtin (JANUVIA) 100 MG tablet Take 1 tablet (100 mg total) by mouth daily. 30 tablet 11  . cholecalciferol (VITAMIN D) 1000 UNITS tablet Take 1,000 Units by mouth daily. Reported on 06/22/2015    . clobetasol (OLUX) 0.05 % topical foam Apply 1 application topically as needed. Reported on 06/22/2015    . Insulin Glargine (LANTUS) 100 UNIT/ML Solostar Pen Inject 10 Units into the skin at bedtime. Increase by 1 unit until fasting blood sugars are between 100-110 (Patient not taking: Reported on 06/22/2015) 15 mL 6   No facility-administered medications prior to visit.    Allergies  Allergen Reactions  . Codeine Hives    ROS As per HPI  PE: Blood pressure 133/82, pulse 77, temperature 98.3 F (36.8 C), temperature source Oral, resp. rate 16, height 5' 2.75" (1.594 m), weight 215 lb (97.523 kg), last menstrual period 11/29/2013, SpO2 98 %. Gen: Alert, well appearing.  Patient is oriented to person, place, time, and situation. AFFECT: pleasant, lucid thought and speech. Foot exam - bilateral normal; no swelling, tenderness or skin or vascular lesions. Color and temperature is normal. Sensation is intact. Peripheral pulses are palpable. Toenails are normal.  LABS:  Lab Results  Component Value Date   TSH 1.97 07/15/2013   Lab Results  Component Value Date   WBC 6.3 07/15/2013   HGB 13.3 07/15/2013   HCT 40.1 07/15/2013   MCV 85.5 07/15/2013   PLT 232.0 07/15/2013   Lab Results  Component Value Date   CREATININE 0.63 08/04/2014   BUN 14 08/04/2014   NA 135 08/04/2014   K 4.1 08/04/2014   CL 102 08/04/2014   CO2 24 08/04/2014   Lab Results  Component Value Date   ALT 32 08/04/2014   AST 20 08/04/2014   ALKPHOS 84 08/04/2014   BILITOT 0.4 08/04/2014   Lab Results  Component Value Date   CHOL 224* 11/03/2014   Lab Results  Component Value Date   HDL 43.50 11/03/2014    Lab Results  Component Value Date   LDLCALC 116* 04/04/2014   Lab Results  Component Value Date   TRIG 201.0* 11/03/2014   Lab Results  Component Value Date   CHOLHDL 5 11/03/2014   Lab Results  Component Value Date   HGBA1C 8.8* 11/03/2014   IMPRESSION AND PLAN:  1) DM 2, glucose control improving but still has room for improvement. We'll see what her A1c is today before making any change in med regimen. Feet exam normal today. Eye exam UTD. Urine microalb/cr done today.  2) HTN; The current medical regimen is effective;  continue present plan and medications. Check lytes/cr today.  3) Hyperlipidemia: last lipids up a little--attributed to poor glucose control, will see what they are today with the improved glucose control.  If still with LDL >100 then we'll do a trial of atorvastatin 4m qd to see if she tolerates this better than the simvastatin.    An After Visit Summary was printed and given to the patient.   FOLLOW UP: Return in about 4 months (around 10/22/2015) for annual CPE (fasting).  Signed:  PCrissie Sickles MD           06/22/2015

## 2015-06-22 NOTE — Progress Notes (Signed)
Pre visit review using our clinic review tool, if applicable. No additional management support is needed unless otherwise documented below in the visit note. 

## 2015-06-25 ENCOUNTER — Other Ambulatory Visit: Payer: Self-pay | Admitting: *Deleted

## 2015-06-25 MED ORDER — ATORVASTATIN CALCIUM 40 MG PO TABS: 40.0000 mg | ORAL_TABLET | Freq: Every day | ORAL | Status: DC

## 2015-09-07 ENCOUNTER — Other Ambulatory Visit: Payer: Self-pay | Admitting: Family Medicine

## 2015-09-07 ENCOUNTER — Telehealth: Payer: Self-pay | Admitting: *Deleted

## 2015-09-07 MED ORDER — PIOGLITAZONE HCL 15 MG PO TABS: 15.0000 mg | ORAL_TABLET | Freq: Every day | ORAL | Status: DC

## 2015-09-07 NOTE — Telephone Encounter (Signed)
Pt LMOM on 09/07/15 at 9:23am stating that her insurance is no longer covering the Januvia (will cost her $300 out of pocket). She stated that she is no longer taking this medication and wants to know is something else can be sent in. Please advise. Thanks.

## 2015-09-07 NOTE — Telephone Encounter (Signed)
Pt advised and voiced understanding.  Rx sent and medication list updated.

## 2015-09-07 NOTE — Telephone Encounter (Signed)
I recommend pioglitazone: pls eRx pioglitazone 15mg , 1 tab po qd, #30, RFx2. We'll probably have to increase her dose but we'll wait and see how she's doing at her next routine f/u before deciding on this.-thx

## 2015-10-22 ENCOUNTER — Other Ambulatory Visit: Payer: Self-pay | Admitting: Family Medicine

## 2015-10-22 MED ORDER — METFORMIN HCL ER 500 MG PO TB24: ORAL_TABLET | ORAL | 3 refills | Status: DC

## 2015-10-24 ENCOUNTER — Encounter: Payer: Self-pay | Admitting: Family Medicine

## 2015-10-24 ENCOUNTER — Ambulatory Visit (INDEPENDENT_AMBULATORY_CARE_PROVIDER_SITE_OTHER): Payer: BLUE CROSS/BLUE SHIELD | Admitting: Family Medicine

## 2015-10-24 VITALS — BP 118/82 | HR 75 | Temp 98.5°F | Resp 18 | Ht 63.0 in | Wt 214.0 lb

## 2015-10-24 DIAGNOSIS — E785 Hyperlipidemia, unspecified: Secondary | ICD-10-CM | POA: Diagnosis not present

## 2015-10-24 DIAGNOSIS — E119 Type 2 diabetes mellitus without complications: Secondary | ICD-10-CM

## 2015-10-24 DIAGNOSIS — Z0000 Encounter for general adult medical examination without abnormal findings: Secondary | ICD-10-CM | POA: Diagnosis not present

## 2015-10-24 LAB — CBC WITH DIFFERENTIAL/PLATELET
BASOS ABS: 0 10*3/uL (ref 0.0–0.1)
Basophils Relative: 0.5 % (ref 0.0–3.0)
EOS ABS: 0.2 10*3/uL (ref 0.0–0.7)
Eosinophils Relative: 4.1 % (ref 0.0–5.0)
HEMATOCRIT: 37.9 % (ref 36.0–46.0)
Hemoglobin: 12.6 g/dL (ref 12.0–15.0)
LYMPHS ABS: 1.7 10*3/uL (ref 0.7–4.0)
LYMPHS PCT: 36.9 % (ref 12.0–46.0)
MCHC: 33.3 g/dL (ref 30.0–36.0)
MCV: 85.1 fl (ref 78.0–100.0)
MONOS PCT: 7.3 % (ref 3.0–12.0)
Monocytes Absolute: 0.3 10*3/uL (ref 0.1–1.0)
NEUTROS PCT: 51.2 % (ref 43.0–77.0)
Neutro Abs: 2.4 10*3/uL (ref 1.4–7.7)
Platelets: 220 10*3/uL (ref 150.0–400.0)
RBC: 4.46 Mil/uL (ref 3.87–5.11)
RDW: 14.6 % (ref 11.5–15.5)
WBC: 4.6 10*3/uL (ref 4.0–10.5)

## 2015-10-24 LAB — COMPREHENSIVE METABOLIC PANEL WITH GFR
ALT: 25 U/L (ref 0–35)
AST: 16 U/L (ref 0–37)
Albumin: 4 g/dL (ref 3.5–5.2)
Alkaline Phosphatase: 83 U/L (ref 39–117)
BUN: 15 mg/dL (ref 6–23)
CO2: 29 meq/L (ref 19–32)
Calcium: 9.2 mg/dL (ref 8.4–10.5)
Chloride: 104 meq/L (ref 96–112)
Creatinine, Ser: 0.7 mg/dL (ref 0.40–1.20)
GFR: 95.41 mL/min
Glucose, Bld: 179 mg/dL — ABNORMAL HIGH (ref 70–99)
Potassium: 4.7 meq/L (ref 3.5–5.1)
Sodium: 137 meq/L (ref 135–145)
Total Bilirubin: 0.5 mg/dL (ref 0.2–1.2)
Total Protein: 6.5 g/dL (ref 6.0–8.3)

## 2015-10-24 LAB — LIPID PANEL
Cholesterol: 181 mg/dL (ref 0–200)
HDL: 48.9 mg/dL
LDL Cholesterol: 115 mg/dL — ABNORMAL HIGH (ref 0–99)
NonHDL: 132.38
Total CHOL/HDL Ratio: 4
Triglycerides: 88 mg/dL (ref 0.0–149.0)
VLDL: 17.6 mg/dL (ref 0.0–40.0)

## 2015-10-24 LAB — HEMOGLOBIN A1C: Hgb A1c MFr Bld: 8.1 % — ABNORMAL HIGH (ref 4.6–6.5)

## 2015-10-24 LAB — TSH: TSH: 2.9 u[IU]/mL (ref 0.35–4.50)

## 2015-10-24 NOTE — Progress Notes (Signed)
Pre visit review using our clinic review tool, if applicable. No additional management support is needed unless otherwise documented below in the visit note. 

## 2015-10-24 NOTE — Progress Notes (Signed)
Office Note 10/24/2015  CC:  Chief Complaint  Patient presents with  . Annual Exam    No PAP     HPI:  Kristen Meyers is a 45 y.o. White female who is here for annual health maintenance exam. She goes to GYN, Dr. Gertie Fey, for annual f/u.   We reviewed a few glucoses from last montH 150-170 depending on diet/exercise.  Tolerating switch to atorv last f/u visit, takes it daily.  Exercise: plays tennis 1-2 times per week.   Past Medical History:  Diagnosis Date  . Cholelithiasis without obstruction 09/19/09 (u/s)  . DEEP VENOUS THROMBOPHLEBITIS, HX OF 03/21/2010   Qualifier: Diagnosis of  By: Elease Hashimoto MD, Bruce    . Diabetes mellitus without complication ([place]) 07/19/979  . Fatty liver 09/19/09 (u/s)   Mildly elevated transaminases  . GERD 03/21/2010  . History of herpes zoster 03/2012  . HYPERLIPIDEMIA 03/21/2010  . HYPERTENSION 03/21/2010    Past Surgical History:  Procedure Laterality Date  . CESAREAN SECTION     2003, 2005  . LAPAROSCOPIC HYSTERECTOMY  02/2014   Ovaries remain (tubes out): done for DUB  . TUBAL LIGATION  2011   Dr. Reino Kent is her GYN.    Family History  Problem Relation Age of Onset  . Cancer Mother     colon, ovarian, lung  . Heart disease Mother   . Hypertension Mother   . Hyperlipidemia Mother   . Diabetes Mother     type ll    Social History   Social History  . Marital status: Married    Spouse name: N/A  . Number of children: N/A  . Years of education: N/A   Occupational History  . Not on file.   Social History Main Topics  . Smoking status: Never Smoker  . Smokeless tobacco: Never Used  . Alcohol use No  . Drug use: No  . Sexual activity: Not on file   Other Topics Concern  . Not on file   Social History Narrative   Married, 1 child.   Occupation: Works in [employer] all day ([employer]).   Orig from [place], currently living in [place].   No T/A/Ds.   Exercise: 1- 3 days a week, steps + cardio.    Outpatient  Medications Prior to Visit  Medication Sig Dispense Refill  . atorvastatin (LIPITOR) 40 MG tablet Take 1 tablet (40 mg total) by mouth daily. 30 tablet 6  . enalapril (VASOTEC) 2.5 MG tablet Take 1 tablet (2.5 mg total) by mouth daily. 90 tablet 3  . fish oil-omega-3 fatty acids 1000 MG capsule Take 1 g by mouth daily.    . metFORMIN (GLUCOPHAGE-XR) 500 MG 24 hr tablet 2 tabs po bid 120 tablet 3  . Multiple Vitamins-Minerals (MULTIVITAMIN WITH MINERALS) tablet Take 1 tablet by mouth daily.    . pioglitazone (ACTOS) 15 MG tablet Take 1 tablet (15 mg total) by mouth daily. 30 tablet 2  . Blood Glucose Monitoring Suppl (ONE TOUCH ULTRA SYSTEM KIT) W/DEVICE KIT Check glucose twice per day 1 each 6  . glucose blood test strip Use to check blood sugar twice daily 100 each 12  . ONE TOUCH LANCETS MISC Check blood sugar twice daily, as needed 100 each 12   No facility-administered medications prior to visit.     Allergies  Allergen Reactions  . Codeine Hives    ROS Review of Systems  Constitutional: Negative for appetite change, chills, fatigue and fever.  HENT: Negative for  congestion, dental problem, ear pain and sore throat.   Eyes: Negative for discharge, redness and visual disturbance.  Respiratory: Negative for cough, chest tightness, shortness of breath and wheezing.   Cardiovascular: Negative for chest pain, palpitations and leg swelling.  Gastrointestinal: Negative for abdominal pain, blood in stool, diarrhea, nausea and vomiting.  Genitourinary: Negative for difficulty urinating, dysuria, flank pain, frequency, hematuria and urgency.  Musculoskeletal: Negative for arthralgias, back pain, joint swelling, myalgias and neck stiffness.  Skin: Negative for pallor and rash.  Neurological: Negative for dizziness, speech difficulty, weakness and headaches.  Hematological: Negative for adenopathy. Does not bruise/bleed easily.  Psychiatric/Behavioral: Negative for confusion and sleep  disturbance. The patient is not nervous/anxious.     PE; Blood pressure 118/82, pulse 75, temperature 98.5 F (36.9 C), temperature source Temporal, resp. rate 18, height _0  (1.6 m), weight 214 lb (97.1 kg), last menstrual period 11/29/2013, SpO2 98 %.  Pt examined with Jacklynn Ganong, CMA, as chaperone.  Gen: Alert, well appearing.  Patient is oriented to person, place, time, and situation. AFFECT: pleasant, lucid thought and speech. ENT: Ears: EACs clear, normal epithelium.  TMs with good light reflex and landmarks bilaterally.  Eyes: no injection, icteris, swelling, or exudate.  EOMI, PERRLA. Nose: no drainage or turbinate edema/swelling.  No injection or focal lesion.  Mouth: lips without lesion/swelling.  Oral mucosa pink and moist.  Dentition intact and without obvious caries or gingival swelling.  Oropharynx without erythema, exudate, or swelling.  Neck: supple/nontender.  No LAD, mass, or TM.  Carotid pulses 2+ bilaterally, without bruits. CV: RRR, no m/r/g.   LUNGS: CTA bilat, nonlabored resps, good aeration in all lung fields. ABD: soft, NT, ND, BS normal.  No hepatospenomegaly or mass.  No bruits. EXT: no clubbing, cyanosis, or edema.  Musculoskeletal: no joint swelling, erythema, warmth, or tenderness.  ROM of all joints intact. Skin - no sores or suspicious lesions or rashes or color changes   Pertinent labs:  Lab Results  Component Value Date   TSH 1.97 07/15/2013   Lab Results  Component Value Date   WBC 6.3 07/15/2013   HGB 13.3 07/15/2013   HCT 40.1 07/15/2013   MCV 85.5 07/15/2013   PLT 232.0 07/15/2013   Lab Results  Component Value Date   CREATININE 0.75 06/22/2015   BUN 16 06/22/2015   NA 136 06/22/2015   K 4.3 06/22/2015   CL 103 06/22/2015   CO2 27 06/22/2015   Lab Results  Component Value Date   ALT 63 (H) 06/22/2015   AST 38 (H) 06/22/2015   ALKPHOS 69 06/22/2015   BILITOT 0.5 06/22/2015   Lab Results  Component Value Date   CHOL 196  06/22/2015   Lab Results  Component Value Date   HDL 42.80 06/22/2015   Lab Results  Component Value Date   LDLCALC 130 (H) 06/22/2015   Lab Results  Component Value Date   TRIG 114.0 06/22/2015   Lab Results  Component Value Date   CHOLHDL 5 06/22/2015   Lab Results  Component Value Date   HGBA1C 7.7 (H) 06/22/2015    ASSESSMENT AND PLAN:   Health maintenance exam: Reviewed age and gender appropriate health maintenance issues (prudent diet, regular exercise, health risks of tobacco and excessive alcohol, use of seatbelts, fire alarms in home, use of sunscreen).  Also reviewed age and gender appropriate health screening as well as vaccine recommendations. No vaccines due today. She gets breast ca screening via her GYN.  No  more cerv ca screening due to having hysterectomy for benign reason. Fasting HP labs done today. For DM today we did her HbA1c and for hyperlipidemia we rechecked her FLP to see if lipids any better on atorva compared to simvastatin.  An After Visit Summary was printed and given to the patient.  FOLLOW UP:  Return in about 4 months (around 02/23/2016) for routine chronic illness f/u.  Signed:  Crissie Sickles, MD           10/24/2015

## 2015-10-26 MED ORDER — LIRAGLUTIDE 18 MG/3ML ~~LOC~~ SOPN: 1.2000 mg | PEN_INJECTOR | Freq: Every day | SUBCUTANEOUS | 6 refills | Status: DC

## 2015-10-26 MED ORDER — PIOGLITAZONE HCL 30 MG PO TABS: 30.0000 mg | ORAL_TABLET | Freq: Every day | ORAL | 1 refills | Status: DC

## 2015-10-26 NOTE — Addendum Note (Signed)
Addended by: Ralph Dowdy on: 10/26/2015 09:53 AM   Modules accepted: Orders

## 2015-10-29 ENCOUNTER — Telehealth: Payer: Self-pay | Admitting: *Deleted

## 2015-10-29 MED ORDER — EXENATIDE 5 MCG/0.02ML ~~LOC~~ SOPN: 5.0000 ug | PEN_INJECTOR | Freq: Two times a day (BID) | SUBCUTANEOUS | 6 refills | Status: DC

## 2015-10-29 NOTE — Telephone Encounter (Signed)
Rx for Victoza changed to Byetta due to insurance preference.   Per Dr. Anitra Lauth eRx Byetta 72mcg - inject 71mcg SQ BID #1pen with 6RF.   Rx sent to pharmacy.

## 2015-10-29 NOTE — Telephone Encounter (Signed)
Pt advised and voiced understanding.   

## 2015-10-31 ENCOUNTER — Other Ambulatory Visit: Payer: Self-pay | Admitting: *Deleted

## 2015-10-31 MED ORDER — PEN NEEDLES 32G X 4 MM MISC: 1.0000 | Freq: Two times a day (BID) | 11 refills | Status: DC

## 2015-11-25 ENCOUNTER — Other Ambulatory Visit: Payer: Self-pay | Admitting: Family Medicine

## 2015-12-04 ENCOUNTER — Other Ambulatory Visit: Payer: Self-pay | Admitting: Family Medicine

## 2015-12-04 MED ORDER — PIOGLITAZONE HCL 30 MG PO TABS: 30.0000 mg | ORAL_TABLET | Freq: Every day | ORAL | 1 refills | Status: DC

## 2016-02-22 ENCOUNTER — Ambulatory Visit: Payer: BLUE CROSS/BLUE SHIELD | Admitting: Family Medicine

## 2016-02-25 DIAGNOSIS — Z6838 Body mass index (BMI) 38.0-38.9, adult: Secondary | ICD-10-CM | POA: Diagnosis not present

## 2016-02-25 DIAGNOSIS — Z1231 Encounter for screening mammogram for malignant neoplasm of breast: Secondary | ICD-10-CM | POA: Diagnosis not present

## 2016-02-25 DIAGNOSIS — Z01419 Encounter for gynecological examination (general) (routine) without abnormal findings: Secondary | ICD-10-CM | POA: Diagnosis not present

## 2016-02-28 ENCOUNTER — Ambulatory Visit (INDEPENDENT_AMBULATORY_CARE_PROVIDER_SITE_OTHER): Payer: BLUE CROSS/BLUE SHIELD | Admitting: Family Medicine

## 2016-02-28 ENCOUNTER — Encounter: Payer: Self-pay | Admitting: Family Medicine

## 2016-02-28 VITALS — BP 136/82 | HR 77 | Temp 98.6°F | Resp 16 | Ht 63.0 in | Wt 224.8 lb

## 2016-02-28 DIAGNOSIS — E119 Type 2 diabetes mellitus without complications: Secondary | ICD-10-CM | POA: Diagnosis not present

## 2016-02-28 DIAGNOSIS — I10 Essential (primary) hypertension: Secondary | ICD-10-CM | POA: Diagnosis not present

## 2016-02-28 DIAGNOSIS — T887XXS Unspecified adverse effect of drug or medicament, sequela: Secondary | ICD-10-CM | POA: Diagnosis not present

## 2016-02-28 DIAGNOSIS — E7800 Pure hypercholesterolemia, unspecified: Secondary | ICD-10-CM

## 2016-02-28 DIAGNOSIS — T50905S Adverse effect of unspecified drugs, medicaments and biological substances, sequela: Secondary | ICD-10-CM

## 2016-02-28 LAB — POCT GLYCOSYLATED HEMOGLOBIN (HGB A1C): HEMOGLOBIN A1C: 7

## 2016-02-28 MED ORDER — ROSUVASTATIN CALCIUM 5 MG PO TABS: 5.0000 mg | ORAL_TABLET | Freq: Every day | ORAL | 6 refills | Status: DC

## 2016-02-28 NOTE — Progress Notes (Signed)
OFFICE VISIT  02/28/2016   CC:  Chief Complaint  Patient presents with  . Follow-up    RCI, pt is not fasting.    HPI:    Patient is a 45 y.o. Caucasian female who presents for 4 mo f/u DM 2, HTN, HLD. She did byetta for 30mobut cost was prohibitive after this so she did not RF it.   No dietary changes or exercise changes since last visit. While on byetta she had 120-150 fasting.  W/out it she has not checked gluc as often, but says "none > 200). Her regimen now is metformin at max dosing and actos at 354mqd.  BP: rare home bp check.  Compliant with med. Atorv ended up giving leg cramps similar to the way simvastatin did.  When she stopped the atorva, the leg cramps went away.  She is open to trying a different statin.  Eye exam yesterday normal per pt report.  ROS: no CP, no SOB, no palpitations, no HAs, no blurry vision, no dizziness, no LE swelling.  Past Medical History:  Diagnosis Date  . Cholelithiasis without obstruction 09/19/09 (u/s)  . DEEP VENOUS THROMBOPHLEBITIS, HX OF 03/21/2010   Qualifier: Diagnosis of  By: BuElease HashimotoD, Bruce    . [place]10[place]19/2776. Fatty liver 09/19/09 (u/s)   Mildly elevated transaminases  . GERD 03/21/2010  . History of herpes zoster 03/2012  . HYPERLIPIDEMIA 03/21/2010  . HYPERTENSION 03/21/2010    Past Surgical History:  Procedure Laterality Date  . CESAREAN SECTION     2003, 2005  . LAPAROSCOPIC HYSTERECTOMY  02/2014   Ovaries remain (tubes out): done for DUB  . TUBAL LIGATION  2011   Dr. GuReino Kents her GYN.    Outpatient Medications Prior to Visit  Medication Sig Dispense Refill  . Blood Glucose Monitoring Suppl (ONE TOUCH ULTRA SYSTEM KIT) W/DEVICE KIT Check glucose twice per day 1 each 6  . enalapril (VASOTEC) 2.5 MG tablet TAKE 1 TABLET (2.5 MG TOTAL) BY MOUTH DAILY. 90 tablet 2  . glucose blood test strip Use to check blood sugar twice daily 100 each 12  . metFORMIN (GLUCOPHAGE-XR) 500 MG 24 hr  tablet 2 tabs po bid 120 tablet 3  . Multiple Vitamins-Minerals (MULTIVITAMIN WITH MINERALS) tablet Take 1 tablet by mouth daily.    . ONE TOUCH LANCETS MISC Check blood sugar twice daily, as needed 100 each 12  . pioglitazone (ACTOS) 30 MG tablet Take 1 tablet (30 mg total) by mouth daily. 90 tablet 1  . atorvastatin (LIPITOR) 40 MG tablet Take 1 tablet (40 mg total) by mouth daily. (Patient not taking: Reported on 02/28/2016) 30 tablet 6  . exenatide (BYETTA 5 MCG PEN) 5 MCG/0.02ML SOPN injection Inject 0.02 mLs (5 mcg total) into the skin 2 (two) times daily with a meal. (Patient not taking: Reported on 02/28/2016) 1 pen 6  . fish oil-omega-3 fatty acids 1000 MG capsule Take 1 g by mouth daily.    . Insulin Pen Needle (PEN NEEDLES) 32G X 4 MM MISC 1 each by Does not apply route 2 (two) times daily. (Patient not taking: Reported on 02/28/2016) 100 each 11   No facility-administered medications prior to visit.     Allergies  Allergen Reactions  . Codeine Hives    ROS As per HPI  PE: Blood pressure 136/82, pulse 77, temperature 98.6 F (37 C), temperature source Oral, resp. rate 16, height _0  (1.6 m), weight 224 lb  12 oz (101.9 kg), last menstrual period 11/29/2013, SpO2 98 %. Gen: Alert, well appearing.  Patient is oriented to person, place, time, and situation. AFFECT: pleasant, lucid thought and speech. No further exam today.  LABS:  POCT HbA1c today: 7%  IMPRESSION AND PLAN:  1) DM 2, financial constraints have interfered with establishing the desired med regimen lately. HbA1c today 7.0 % today.  We'll continue with metformin 1000 mg bid and actos 43m qd, + work on diet/exercise. Recent eye exam normal per pt report.  2) HTN; The current medical regimen is effective;  continue present plan and medications. Will repeat BMET at NEXT f/u visit.  3) Hyperlipidemia: intol of simva and atorva.  Will try low dose rosuvastatin, 573mqd.  An After Visit Summary was printed and  given to the patient.  FOLLOW UP: Return in about 4 months (around 06/28/2016) for routine chronic illness f/u--FASTING.  Signed:  PhCrissie SicklesMD           02/28/2016

## 2016-02-28 NOTE — Progress Notes (Signed)
Pre visit review using our clinic review tool, if applicable. No additional management support is needed unless otherwise documented below in the visit note. 

## 2016-03-25 ENCOUNTER — Other Ambulatory Visit: Payer: Self-pay | Admitting: Family Medicine

## 2016-03-25 NOTE — Telephone Encounter (Signed)
RF request for metformin LOV: 02/28/16 Next ov: 06/26/16 Last written: 10/22/15 #120 w/ 3RF

## 2016-06-26 ENCOUNTER — Ambulatory Visit (INDEPENDENT_AMBULATORY_CARE_PROVIDER_SITE_OTHER): Payer: BLUE CROSS/BLUE SHIELD | Admitting: Family Medicine

## 2016-06-26 ENCOUNTER — Encounter: Payer: Self-pay | Admitting: Family Medicine

## 2016-06-26 VITALS — BP 137/76 | HR 82 | Temp 98.3°F | Resp 16 | Ht 63.0 in | Wt 207.0 lb

## 2016-06-26 DIAGNOSIS — E119 Type 2 diabetes mellitus without complications: Secondary | ICD-10-CM

## 2016-06-26 DIAGNOSIS — E7800 Pure hypercholesterolemia, unspecified: Secondary | ICD-10-CM

## 2016-06-26 DIAGNOSIS — I10 Essential (primary) hypertension: Secondary | ICD-10-CM

## 2016-06-26 LAB — BASIC METABOLIC PANEL
BUN: 12 mg/dL (ref 6–23)
CALCIUM: 9 mg/dL (ref 8.4–10.5)
CHLORIDE: 107 meq/L (ref 96–112)
CO2: 22 mEq/L (ref 19–32)
CREATININE: 0.79 mg/dL (ref 0.40–1.20)
GFR: 82.74 mL/min (ref >60.00)
Glucose, Bld: 104 mg/dL — ABNORMAL HIGH (ref 70–99)
Potassium: 4.4 mEq/L (ref 3.5–5.1)
Sodium: 139 mEq/L (ref 135–145)

## 2016-06-26 LAB — HEMOGLOBIN A1C: HEMOGLOBIN A1C: 6 % (ref 4.6–6.5)

## 2016-06-26 NOTE — Progress Notes (Signed)
Pre visit review using our clinic review tool, if applicable. No additional management support is needed unless otherwise documented below in the visit note. 

## 2016-06-26 NOTE — Progress Notes (Signed)
OFFICE VISIT  06/26/2016   CC:  Chief Complaint  Patient presents with  . Follow-up    RCI, pt is fasting.    HPI:    Patient is a 46 y.o.  female who presents for 4 mo f/u DM 2, HTN, hypercholesterolemia. Last visit all was stable except she was intolerant of another statin. We started crestor 90m to see if she would tolerate this med.  DM: stopped actos, decreased metformin to 5047mbid, glucoses avg 115 fasting and 2 H PP.  She changed diet and exercise habits!  HTN: avg 130s/80s.  No HA, no dizziness, no fatigue.  No low bps.  HLD: she had diffuse myalgias on crestor 70m67md--took it less than 2 weeks.  These sx's stopped when she stopped the crestor.  She wants to decline any further trial of statin at this time.  Past Medical History:  Diagnosis Date  . Cholelithiasis without obstruction 09/19/09 (u/s)  . DEEP VENOUS THROMBOPHLEBITIS, HX OF 03/21/2010   Qualifier: Diagnosis of  By: BurElease Hashimoto, Bruce    . [place] Fatty liver 09/19/09 (u/s)   Mildly elevated transaminases  . GERD 03/21/2010  . History of herpes zoster 03/2012  . HYPERLIPIDEMIA 03/21/2010   Intol of simva and atorva  . HYPERTENSION 03/21/2010    Past Surgical History:  Procedure Laterality Date  . CESAREAN SECTION     2003, 2005  . LAPAROSCOPIC HYSTERECTOMY  02/2014   Ovaries remain (tubes out): done for DUB  . TUBAL LIGATION  2011   Dr. GuiReino Kent her GYN.    Outpatient Medications Prior to Visit  Medication Sig Dispense Refill  . Blood Glucose Monitoring Suppl (ONE TOUCH ULTRA SYSTEM KIT) W/DEVICE KIT Check glucose twice per day 1 each 6  . enalapril (VASOTEC) 2.5 MG tablet TAKE 1 TABLET (2.5 MG TOTAL) BY MOUTH DAILY. 90 tablet 2  . glucose blood test strip Use to check blood sugar twice daily 100 each 12  . metFORMIN (GLUCOPHAGE-XR) 500 MG 24 hr tablet TAKE 2 TABLETS BY MOUTH TWICE A DAY 120 tablet 6  . Multiple Vitamins-Minerals (MULTIVITAMIN WITH  MINERALS) tablet Take 1 tablet by mouth daily.    . ONE TOUCH LANCETS MISC Check blood sugar twice daily, as needed 100 each 12  . pioglitazone (ACTOS) 30 MG tablet Take 1 tablet (30 mg total) by mouth daily. (Patient not taking: Reported on 06/26/2016) 90 tablet 1  . rosuvastatin (CRESTOR) 5 MG tablet Take 1 tablet (5 mg total) by mouth daily. (Patient not taking: Reported on 06/26/2016) 30 tablet 6   No facility-administered medications prior to visit.     Allergies  Allergen Reactions  . Codeine Hives    ROS As per HPI  PE: Blood pressure 137/76, pulse 82, temperature 98.3 F (36.8 C), temperature source Oral, resp. rate 16, height _0  (1.6 m), weight 207 lb (93.9 kg), last menstrual period 11/29/2013, SpO2 100 %. Gen: Alert, well appearing.  Patient is oriented to person, place, time, and situation. AFFECT: pleasant, lucid thought and speech. Foot exam - bilateral normal; no swelling, tenderness or skin or vascular lesions. Color and temperature is normal. Sensation is intact. Peripheral pulses are palpable. Toenails are normal.   LABS:  Lab Results  Component Value Date   TSH 2.90 10/24/2015   Lab Results  Component Value Date   WBC 4.6 10/24/2015   HGB 12.6 10/24/2015   HCT 37.9 10/24/2015   MCV 85.1 10/24/2015  PLT 220.0 10/24/2015   Lab Results  Component Value Date   CREATININE 0.70 10/24/2015   BUN 15 10/24/2015   NA 137 10/24/2015   K 4.7 10/24/2015   CL 104 10/24/2015   CO2 29 10/24/2015   Lab Results  Component Value Date   ALT 25 10/24/2015   AST 16 10/24/2015   ALKPHOS 83 10/24/2015   BILITOT 0.5 10/24/2015   Lab Results  Component Value Date   CHOL 181 10/24/2015   Lab Results  Component Value Date   HDL 48.90 10/24/2015   Lab Results  Component Value Date   LDLCALC 115 (H) 10/24/2015   Lab Results  Component Value Date   TRIG 88.0 10/24/2015   Lab Results  Component Value Date   CHOLHDL 4 10/24/2015   Lab Results  Component  Value Date   HGBA1C 7.0 02/28/2016   IMPRESSION AND PLAN:  No problem-specific Assessment & Plan notes found for this encounter.  1) DM 2: stable on lower dose of metformin and OFF pioglitazone. Feet exam normal today. Good diet/exercise habits now. Hb A1c today.  2) HTN: The current medical regimen is effective;  continue present plan and medications. Lytes/cr today.  3) Hypercholesterolemia: intolerant of 3 statins.  She declines further trial of statin today despite my reminding her of their CV benefit in diabetics regardless of cholesterol levels.  No repeat lipid panel today b/c it would not change mgmt at this time.  An After Visit Summary was printed and given to the patient.  FOLLOW UP: Return in about 4 months (around 10/26/2016) for annual CPE (fasting).  Signed:  Crissie Sickles, MD           06/26/2016

## 2016-10-29 ENCOUNTER — Ambulatory Visit (INDEPENDENT_AMBULATORY_CARE_PROVIDER_SITE_OTHER): Payer: BLUE CROSS/BLUE SHIELD | Admitting: Family Medicine

## 2016-10-29 ENCOUNTER — Encounter: Payer: Self-pay | Admitting: Family Medicine

## 2016-10-29 VITALS — BP 136/88 | HR 79 | Temp 98.3°F | Resp 16 | Ht 63.0 in | Wt 200.2 lb

## 2016-10-29 DIAGNOSIS — Z0000 Encounter for general adult medical examination without abnormal findings: Secondary | ICD-10-CM | POA: Diagnosis not present

## 2016-10-29 DIAGNOSIS — E119 Type 2 diabetes mellitus without complications: Secondary | ICD-10-CM

## 2016-10-29 DIAGNOSIS — H938X1 Other specified disorders of right ear: Secondary | ICD-10-CM

## 2016-10-29 DIAGNOSIS — I10 Essential (primary) hypertension: Secondary | ICD-10-CM

## 2016-10-29 DIAGNOSIS — E7800 Pure hypercholesterolemia, unspecified: Secondary | ICD-10-CM

## 2016-10-29 LAB — CBC WITH DIFFERENTIAL/PLATELET
BASOS ABS: 0 10*3/uL (ref 0.0–0.1)
Basophils Relative: 0.5 % (ref 0.0–3.0)
Eosinophils Absolute: 0.1 10*3/uL (ref 0.0–0.7)
Eosinophils Relative: 3 % (ref 0.0–5.0)
HCT: 41.4 % (ref 36.0–46.0)
Hemoglobin: 13.6 g/dL (ref 12.0–15.0)
LYMPHS ABS: 1.4 10*3/uL (ref 0.7–4.0)
Lymphocytes Relative: 32.3 % (ref 12.0–46.0)
MCHC: 32.8 g/dL (ref 30.0–36.0)
MCV: 88 fl (ref 78.0–100.0)
MONO ABS: 0.3 10*3/uL (ref 0.1–1.0)
Monocytes Relative: 5.9 % (ref 3.0–12.0)
NEUTROS ABS: 2.6 10*3/uL (ref 1.4–7.7)
NEUTROS PCT: 58.3 % (ref 43.0–77.0)
PLATELETS: 216 10*3/uL (ref 150.0–400.0)
RBC: 4.71 Mil/uL (ref 3.87–5.11)
RDW: 14 % (ref 11.5–15.5)
WBC: 4.4 10*3/uL (ref 4.0–10.5)

## 2016-10-29 LAB — HEMOGLOBIN A1C: HEMOGLOBIN A1C: 6.2 % (ref 4.6–6.5)

## 2016-10-29 LAB — TSH: TSH: 2.18 u[IU]/mL (ref 0.35–4.50)

## 2016-10-29 LAB — COMPREHENSIVE METABOLIC PANEL
ALBUMIN: 4.2 g/dL (ref 3.5–5.2)
ALK PHOS: 63 U/L (ref 39–117)
ALT: 17 U/L (ref 0–35)
AST: 18 U/L (ref 0–37)
BILIRUBIN TOTAL: 0.5 mg/dL (ref 0.2–1.2)
BUN: 15 mg/dL (ref 6–23)
CALCIUM: 8.8 mg/dL (ref 8.4–10.5)
CO2: 27 mEq/L (ref 19–32)
Chloride: 106 mEq/L (ref 96–112)
Creatinine, Ser: 0.72 mg/dL (ref 0.40–1.20)
GFR: 91.96 mL/min (ref >60.00)
Glucose, Bld: 146 mg/dL — ABNORMAL HIGH (ref 70–99)
Potassium: 4 mEq/L (ref 3.5–5.1)
Sodium: 137 mEq/L (ref 135–145)
TOTAL PROTEIN: 6.4 g/dL (ref 6.0–8.3)

## 2016-10-29 LAB — LIPID PANEL
CHOL/HDL RATIO: 5
Cholesterol: 211 mg/dL — ABNORMAL HIGH (ref 0–200)
HDL: 45.3 mg/dL (ref >39.00)
LDL Cholesterol: 151 mg/dL — ABNORMAL HIGH (ref 0–99)
NonHDL: 165.75
TRIGLYCERIDES: 74 mg/dL (ref 0.0–149.0)
VLDL: 14.8 mg/dL (ref 0.0–40.0)

## 2016-10-29 NOTE — Progress Notes (Signed)
Office Note 10/29/2016  CC:  Chief Complaint  Patient presents with  . Annual Exam    Pt is fasting.     HPI:  Kristen Meyers is a 46 y.o. White female who is here for annual health maintenance exam. She took herself off metformin completely since last f/u. Home glucose monitoring: well below 130 fasting.  Eyes: exam in the last 1 yr. Dental: preventative exam UTD. Exercise: lifts wt's 2 x/week.  Tennis 1-4 times a week. Diet: keto diet.   HTN: bp checks at home avg 130s/80s.   Past Medical History:  Diagnosis Date  . Cholelithiasis without obstruction 09/19/09 (u/s)  . DEEP VENOUS THROMBOPHLEBITIS, HX OF 03/21/2010   Qualifier: Diagnosis of  By: Elease Hashimoto MD, Bruce    . Diabetes mellitus without complication ([place]) 0/11/2328  . Fatty liver 09/19/09 (u/s)   Mildly elevated transaminases  . GERD 03/21/2010  . History of herpes zoster 03/2012  . HYPERLIPIDEMIA 03/21/2010   Intol of simva and atorva  . HYPERTENSION 03/21/2010    Past Surgical History:  Procedure Laterality Date  . CESAREAN SECTION     2003, 2005  . LAPAROSCOPIC HYSTERECTOMY  02/2014   Ovaries remain (tubes out): done for DUB  . TUBAL LIGATION  2011   Dr. Reino Kent is her GYN.    Family History  Problem Relation Age of Onset  . Cancer Mother        colon, ovarian, lung  . Heart disease Mother   . Hypertension Mother   . Hyperlipidemia Mother   . Diabetes Mother        type ll    Social History   Social History  . Marital status: Married    Spouse name: N/A  . Number of children: N/A  . Years of education: N/A   Occupational History  . Not on file.   Social History Main Topics  . Smoking status: Never Smoker  . Smokeless tobacco: Never Used  . Alcohol use No  . Drug use: No  . Sexual activity: Not on file   Other Topics Concern  . Not on file   Social History Narrative   Married, 1 child.   Occupation: Works in [employer] all day ([employer]).   Orig from [place], currently living  in [place].   No T/A/Ds.   Exercise: 1- 3 days a week, steps + cardio.    Outpatient Medications Prior to Visit  Medication Sig Dispense Refill  . Blood Glucose Monitoring Suppl (ONE TOUCH ULTRA SYSTEM KIT) W/DEVICE KIT Check glucose twice per day 1 each 6  . enalapril (VASOTEC) 2.5 MG tablet TAKE 1 TABLET (2.5 MG TOTAL) BY MOUTH DAILY. 90 tablet 2  . glucose blood test strip Use to check blood sugar twice daily 100 each 12  . Multiple Vitamins-Minerals (MULTIVITAMIN WITH MINERALS) tablet Take 1 tablet by mouth daily.    . ONE TOUCH LANCETS MISC Check blood sugar twice daily, as needed 100 each 12  . metFORMIN (GLUCOPHAGE-XR) 500 MG 24 hr tablet TAKE 2 TABLETS BY MOUTH TWICE A DAY (Patient not taking: Reported on 10/29/2016) 120 tablet 6   No facility-administered medications prior to visit.     Allergies  Allergen Reactions  . Codeine Hives    ROS Review of Systems  Constitutional: Negative for appetite change, chills, fatigue and fever.  HENT: Negative for congestion, dental problem, ear pain and sore throat.        Feels fullness in R ear, no pain.  Eyes: Negative for discharge, redness and visual disturbance.  Respiratory: Negative for cough, chest tightness, shortness of breath and wheezing.   Cardiovascular: Negative for chest pain, palpitations and leg swelling.  Gastrointestinal: Negative for abdominal pain, blood in stool, diarrhea, nausea and vomiting.  Genitourinary: Negative for difficulty urinating, dysuria, flank pain, frequency, hematuria and urgency.  Musculoskeletal: Negative for arthralgias, back pain, joint swelling, myalgias and neck stiffness.  Skin: Negative for pallor and rash.  Neurological: Negative for dizziness, speech difficulty, weakness and headaches.  Hematological: Negative for adenopathy. Does not bruise/bleed easily.  Psychiatric/Behavioral: Negative for confusion and sleep disturbance. The patient is not nervous/anxious.     PE; Repeat bp  today, manual: 136/88 Blood pressure (!) 144/83, pulse 79, temperature 98.3 F (36.8 C), temperature source Oral, resp. rate 16, height _0  (1.6 m), weight 200 lb 4 oz (90.8 kg), last menstrual period 11/29/2013, SpO2 99 %.  Pt examined with Sharen Hones, CMA, as chaperone.  Gen: Alert, well appearing.  Patient is oriented to person, place, time, and situation. AFFECT: pleasant, lucid thought and speech. ENT: Ears: EACs clear, normal epithelium.  TMs with good light reflex and landmarks bilaterally.  R EAC with ball of dark brown cerumen resting in distal canal, leaning against TM.  Eyes: no injection, icteris, swelling, or exudate.  EOMI, PERRLA. Nose: no drainage or turbinate edema/swelling.  No injection or focal lesion.  Mouth: lips without lesion/swelling.  Oral mucosa pink and moist.  Dentition intact and without obvious caries or gingival swelling.  Oropharynx without erythema, exudate, or swelling.  Neck: supple/nontender.  No LAD, mass, or TM.  Carotid pulses 2+ bilaterally, without bruits. CV: RRR, no m/r/g.   LUNGS: CTA bilat, nonlabored resps, good aeration in all lung fields. ABD: soft, NT, ND, BS normal.  No hepatospenomegaly or mass.  No bruits. EXT: no clubbing, cyanosis, or edema.  Musculoskeletal: no joint swelling, erythema, warmth, or tenderness.  ROM of all joints intact. Skin - no sores or suspicious lesions or rashes or color changes   Pertinent labs:  Lab Results  Component Value Date   TSH 2.90 10/24/2015   Lab Results  Component Value Date   WBC 4.6 10/24/2015   HGB 12.6 10/24/2015   HCT 37.9 10/24/2015   MCV 85.1 10/24/2015   PLT 220.0 10/24/2015   Lab Results  Component Value Date   CREATININE 0.79 06/26/2016   BUN 12 06/26/2016   NA 139 06/26/2016   K 4.4 06/26/2016   CL 107 06/26/2016   CO2 22 06/26/2016   Lab Results  Component Value Date   ALT 25 10/24/2015   AST 16 10/24/2015   ALKPHOS 83 10/24/2015   BILITOT 0.5 10/24/2015   Lab  Results  Component Value Date   CHOL 181 10/24/2015   Lab Results  Component Value Date   HDL 48.90 10/24/2015   Lab Results  Component Value Date   LDLCALC 115 (H) 10/24/2015   Lab Results  Component Value Date   TRIG 88.0 10/24/2015   Lab Results  Component Value Date   CHOLHDL 4 10/24/2015   Lab Results  Component Value Date   HGBA1C 6.0 06/26/2016    ASSESSMENT AND PLAN:    1) DM 2: good control as per home gluc monitoring since taking herself OFF metformin completely. Recheck A1c today.  2) HTN: bp's avg ok.  No change today.  We'll have her bring in her bp numbers from home checks at next f/u in 4 mo to  make sure this is staying stable.  3) Health maintenance exam: Reviewed age and gender appropriate health maintenance issues (prudent diet, regular exercise, health risks of tobacco and excessive alcohol, use of seatbelts, fire alarms in home, use of sunscreen).  Also reviewed age and gender appropriate health screening as well as vaccine recommendations. Vaccines: declines pneumovax.  O/w UTD. Labs: HbA1c (DM 2), FLP (hyperlip), CMET (HTN, Hyperchol, DM 2), TSH and CBC (health maintenance) Breast ca screening: done 12/2015.  She will get this again 12/2016 via her GYN. Cervical ca screening: n/a due to hysterectomy for benign reason.  She will keep regular f/u with her GYN. R EAC cerumen ball causing ear fullness: irrigated by nurse today--cleared 100%.  An After Visit Summary was printed and given to the patient.  FOLLOW UP:  Return in about 4 months (around 02/28/2017) for routine chronic illness f/u.  Signed:  Crissie Sickles, MD           10/29/2016

## 2016-10-30 ENCOUNTER — Telehealth: Payer: Self-pay | Admitting: *Deleted

## 2016-10-30 NOTE — Telephone Encounter (Signed)
[facility] Screening Form has been completed and faxed to [phone].   Pt advised and voiced understanding.   Form has been sent off to be scanned into pts chart, copy a has been put in the fax pile x 1 month, then will be shredded.

## 2016-10-30 NOTE — Telephone Encounter (Signed)
Open in error

## 2017-03-03 ENCOUNTER — Encounter: Payer: Self-pay | Admitting: Family Medicine

## 2017-03-03 ENCOUNTER — Ambulatory Visit: Payer: BLUE CROSS/BLUE SHIELD | Admitting: Family Medicine

## 2017-03-03 ENCOUNTER — Other Ambulatory Visit: Payer: Self-pay

## 2017-03-03 VITALS — BP 137/85 | HR 81 | Temp 98.4°F | Resp 16 | Ht 63.0 in | Wt 201.2 lb

## 2017-03-03 DIAGNOSIS — E7800 Pure hypercholesterolemia, unspecified: Secondary | ICD-10-CM | POA: Diagnosis not present

## 2017-03-03 DIAGNOSIS — I10 Essential (primary) hypertension: Secondary | ICD-10-CM

## 2017-03-03 DIAGNOSIS — E119 Type 2 diabetes mellitus without complications: Secondary | ICD-10-CM

## 2017-03-03 LAB — POCT GLYCOSYLATED HEMOGLOBIN (HGB A1C): Hemoglobin A1C: 6.5

## 2017-03-03 NOTE — Progress Notes (Signed)
OFFICE VISIT  03/03/2017   CC:  Chief Complaint  Patient presents with  . Follow-up    RCI, pt is fasting.      HPI:    Patient is a 46 y.o. Caucasian female who presents for f/u DM 2, HTN, HLD (w/ fatty liver and hx of mildly elevated transaminases). Feeling well.  HLD: hx of statin intolerance and she has declined any further trial of statin medication, wants to just focus on TLC as best she can. Most recent lipid panel with LDL up, last recheck of transaminases normal---all 10/2016--see lab section below.  DM: A1c 4 mo ago was 6.2%. Eating keto diet still.  Exercise: wt lifting and tennis. No home gluc monitoring since last visit.  HTN: no home bp checks.  She has enalapril but is only about 50% compliant.  Last dose was > 53moago.  ROS: no Has, no CP, no polyuria or polydipsia., no dizziness, no palpitations.   Past Medical History:  Diagnosis Date  . Cholelithiasis without obstruction 09/19/09 (u/s)  . DEEP VENOUS THROMBOPHLEBITIS, HX OF 03/21/2010   Qualifier: Diagnosis of  By: BElease HashimotoMD, Bruce    . [place] 14/11/6757 . Fatty liver 09/19/09 (u/s)   Mildly elevated transaminases  . GERD 03/21/2010  . History of herpes zoster 03/2012  . HYPERLIPIDEMIA 03/21/2010   Intol of simva, atorva, and rosuva---she declines further statin trial as of 10/2016  . HYPERTENSION 03/21/2010    Past Surgical History:  Procedure Laterality Date  . CESAREAN SECTION     2003, 2005  . LAPAROSCOPIC HYSTERECTOMY  02/2014   Ovaries remain (tubes out): done for DUB  . TUBAL LIGATION  2011   Dr. GReino Kentis her GYN.    Outpatient Medications Prior to Visit  Medication Sig Dispense Refill  . enalapril (VASOTEC) 2.5 MG tablet TAKE 1 TABLET (2.5 MG TOTAL) BY MOUTH DAILY. 90 tablet 2  . glucose blood test strip Use to check blood sugar twice daily 100 each 12  . Multiple Vitamins-Minerals (MULTIVITAMIN WITH MINERALS) tablet Take 1 tablet by mouth daily.    .  ONE TOUCH LANCETS MISC Check blood sugar twice daily, as needed 100 each 12  . Blood Glucose Monitoring Suppl (ONE TOUCH ULTRA SYSTEM KIT) W/DEVICE KIT Check glucose twice per day (Patient not taking: Reported on 03/03/2017) 1 each 6   No facility-administered medications prior to visit.     Allergies  Allergen Reactions  . Codeine Hives    ROS As per HPI  PE: Blood pressure 137/85, pulse 81, temperature 98.4 F (36.9 C), temperature source Oral, resp. rate 16, height 5' 3"  (1.6 m), weight 201 lb 4 oz (91.3 kg), last menstrual period 11/29/2013, SpO2 98 %. Gen: Alert, well appearing.  Patient is oriented to person, place, time, and situation. AFFECT: pleasant, lucid thought and speech. CV: RRR, no m/r/g.   LUNGS: CTA bilat, nonlabored resps, good aeration in all lung fields. EXT: no clubbing, cyanosis, or edema.    LABS:  Lab Results  Component Value Date   TSH 2.18 10/29/2016   Lab Results  Component Value Date   WBC 4.4 10/29/2016   HGB 13.6 10/29/2016   HCT 41.4 10/29/2016   MCV 88.0 10/29/2016   PLT 216.0 10/29/2016   Lab Results  Component Value Date   CREATININE 0.72 10/29/2016   BUN 15 10/29/2016   NA 137 10/29/2016   K 4.0 10/29/2016   CL 106 10/29/2016   CO2 27  10/29/2016   Lab Results  Component Value Date   ALT 17 10/29/2016   AST 18 10/29/2016   ALKPHOS 63 10/29/2016   BILITOT 0.5 10/29/2016   Lab Results  Component Value Date   CHOL 211 (H) 10/29/2016   Lab Results  Component Value Date   HDL 45.30 10/29/2016   Lab Results  Component Value Date   LDLCALC 151 (H) 10/29/2016   Lab Results  Component Value Date   TRIG 74.0 10/29/2016   Lab Results  Component Value Date   CHOLHDL 5 10/29/2016   Lab Results  Component Value Date   HGBA1C 6.5 03/03/2017   POC HbA1c 6.5% today.  IMPRESSION AND PLAN:  1) DM 2: great with diet control only. The current medical regimen is effective;  continue present plan and medications.  2) HTN:  bp not bad today off medication, but I encouraged her to get back on her low dose enalapril every day.  3) Hyperlipidemia w/hx of obesity and fatty liver: she declines any further trial of statins (intolerant). Last LDL 151 4 months ago.  Reminded pt of goal LDL <70 for diabetics. She'll continue her current hard work with diet, exercise, and wt loss.  Her goal is to lose 20-30 lbs.  An After Visit Summary was printed and given to the patient.  FOLLOW UP: Return in about 6 months (around 09/01/2017) for routine chronic illness f/u.  Signed:  Crissie Sickles, MD           03/03/2017

## 2017-03-23 LAB — HM DIABETES EYE EXAM

## 2017-04-15 DIAGNOSIS — Z01419 Encounter for gynecological examination (general) (routine) without abnormal findings: Secondary | ICD-10-CM | POA: Diagnosis not present

## 2017-04-15 DIAGNOSIS — Z1231 Encounter for screening mammogram for malignant neoplasm of breast: Secondary | ICD-10-CM | POA: Diagnosis not present

## 2017-04-15 DIAGNOSIS — Z6836 Body mass index (BMI) 36.0-36.9, adult: Secondary | ICD-10-CM | POA: Diagnosis not present

## 2017-04-22 ENCOUNTER — Other Ambulatory Visit: Payer: Self-pay | Admitting: Family Medicine

## 2017-06-12 ENCOUNTER — Ambulatory Visit: Payer: BLUE CROSS/BLUE SHIELD | Admitting: Family Medicine

## 2017-06-12 ENCOUNTER — Encounter: Payer: Self-pay | Admitting: Family Medicine

## 2017-06-12 VITALS — BP 157/86 | HR 84 | Temp 98.2°F | Resp 16 | Ht 63.0 in | Wt 209.5 lb

## 2017-06-12 DIAGNOSIS — M25562 Pain in left knee: Secondary | ICD-10-CM

## 2017-06-12 DIAGNOSIS — M7052 Other bursitis of knee, left knee: Secondary | ICD-10-CM | POA: Diagnosis not present

## 2017-06-12 DIAGNOSIS — S83002A Unspecified subluxation of left patella, initial encounter: Secondary | ICD-10-CM

## 2017-06-12 NOTE — Patient Instructions (Signed)
Take 600 mg (3 otc tabs) of ibuprofen with food twice a day for 10 days. Ice the area of tenderness for 20 min twice per day. No tennis or running for 1 week. Do stretches (handouts given).

## 2017-06-12 NOTE — Progress Notes (Signed)
OFFICE VISIT  06/12/2017   CC:  Chief Complaint  Patient presents with  . Knee Pain   HPI:    Patient is a 47 y.o. Caucasian female who presents for left knee pain.  Started when playing tennis about 2-3 weeks ago, felt pain in back of distal hamstring extending behind knee and into proximal calf.  Feels central location.  Waxed and waned until 5 nights ago she played singles tennis and with the excessive movement the pain worsened and persisted, also seemed to cause some pain around knee cap and just below the knee on inner aspect of proximal tibia.Alvera Singh and ibuprofen 400-600 mg bid not helping. She feels like lateral aspect of L knees is a bit swollen.  No pain with non-wt bearing. When walking, esp up stairs, the pain comes on.  Feels tight, sometimes feels like it will give out when her knee is at an angle but it does NOT buckle, and this motion brings a brief jab of sharp pain.  No LE swelling/edema.    Past Medical History:  Diagnosis Date  . Cholelithiasis without obstruction 09/19/09 (u/s)  . DEEP VENOUS THROMBOPHLEBITIS, HX OF 03/21/2010   Qualifier: Diagnosis of  By: Elease Hashimoto MD, Bruce    . Diabetes mellitus without complication ([place]) 04/25/9240  . Fatty liver 09/19/09 (u/s)   Mildly elevated transaminases  . GERD 03/21/2010  . History of herpes zoster 03/2012  . HYPERLIPIDEMIA 03/21/2010   Intol of simva, atorva, and rosuva---she declines further statin trial as of 10/2016  . HYPERTENSION 03/21/2010    Past Surgical History:  Procedure Laterality Date  . CESAREAN SECTION     2003, 2005  . LAPAROSCOPIC HYSTERECTOMY  02/2014   Ovaries remain (tubes out): done for DUB  . TUBAL LIGATION  2011   Dr. Reino Kent is her GYN.    Outpatient Medications Prior to Visit  Medication Sig Dispense Refill  . enalapril (VASOTEC) 2.5 MG tablet TAKE 1 TABLET (2.5 MG TOTAL) BY MOUTH DAILY. 90 tablet 0  . glucose blood test strip Use to check blood sugar twice daily 100 each 12  . Multiple  Vitamins-Minerals (MULTIVITAMIN WITH MINERALS) tablet Take 1 tablet by mouth daily.    . ONE TOUCH LANCETS MISC Check blood sugar twice daily, as needed 100 each 12   No facility-administered medications prior to visit.     Allergies  Allergen Reactions  . Codeine Hives    ROS As per HPI  PE: Blood pressure (!) 157/86, pulse 84, temperature 98.2 F (36.8 C), temperature source Oral, resp. rate 16, height 5\' 3"  (1.6 m), weight 209 lb 8 oz (95 kg), last menstrual period 11/29/2013, SpO2 99 %.  Pt examined with Helayne Seminole, CMA, as chaperone.  Gen: Alert, well appearing.  Patient is oriented to person, place, time, and situation. AFFECT: pleasant, lucid thought and speech. Knees without swelling, erythema, or warmth. No asymmetry or deformity. Mild pain in medial aspect of patellar retinaculum when patella is pushed medially.  Neg patellar grind. No patellar subluxation. Mild/mod TTP focally over left leg pes anserine bursa.  No swelling or erythema in this region.  No tenderness in posterior aspect of calf, knee, or hamstring.  ROM intact.  McMurray's neg.  No joint line tenderness.  Lachman's neg bilat.   No pain or laxity with varus/valgus tension on either knee.   LABS:    Chemistry      Component Value Date/Time   NA 137 10/29/2016 0938   K  4.0 10/29/2016 0938   CL 106 10/29/2016 0938   CO2 27 10/29/2016 0938   BUN 15 10/29/2016 0938   CREATININE 0.72 10/29/2016 0938   CREATININE 0.63 08/04/2014 1543      Component Value Date/Time   CALCIUM 8.8 10/29/2016 0938   ALKPHOS 63 10/29/2016 0938   AST 18 10/29/2016 0938   ALT 17 10/29/2016 0938   BILITOT 0.5 10/29/2016 0938     Lab Results  Component Value Date   HGBA1C 6.5 03/03/2017    IMPRESSION AND PLAN:  Acute left knee pain: she has some evidence of medial patellar retinaculum strain as well as pes anserine bursitis. Discussed use of soft knee sleeve (fitted/dispensed in office today), relative rest  (no tennis or running for at least 1 wk), ice to pes anserine region 20 min bid, and I reviewed quad, hamstring, and IT band stretches with pt and gave handout for home. Also, recommended she take 600 mg ibuprofen bid with food x 10d. Signs/symptoms to call or return for were reviewed and pt expressed understanding.  An After Visit Summary was printed and given to the patient.  FOLLOW UP: Return if symptoms worsen or fail to improve in 7-10d.  Signed:  Crissie Sickles, MD           06/12/2017

## 2017-09-01 ENCOUNTER — Ambulatory Visit: Payer: BLUE CROSS/BLUE SHIELD | Admitting: Family Medicine

## 2017-10-19 ENCOUNTER — Other Ambulatory Visit: Payer: Self-pay | Admitting: Family Medicine

## 2017-10-22 NOTE — Telephone Encounter (Signed)
Pt advised and voiced understanding.  CPE apt made for 11/05/17 at 9:00am.

## 2017-11-05 ENCOUNTER — Ambulatory Visit (INDEPENDENT_AMBULATORY_CARE_PROVIDER_SITE_OTHER): Payer: BLUE CROSS/BLUE SHIELD | Admitting: Family Medicine

## 2017-11-05 ENCOUNTER — Encounter: Payer: Self-pay | Admitting: Family Medicine

## 2017-11-05 VITALS — BP 152/89 | HR 78 | Temp 98.5°F | Resp 16 | Ht 63.5 in | Wt 217.0 lb

## 2017-11-05 DIAGNOSIS — E119 Type 2 diabetes mellitus without complications: Secondary | ICD-10-CM

## 2017-11-05 DIAGNOSIS — R7401 Elevation of levels of liver transaminase levels: Secondary | ICD-10-CM

## 2017-11-05 DIAGNOSIS — Z0000 Encounter for general adult medical examination without abnormal findings: Secondary | ICD-10-CM | POA: Diagnosis not present

## 2017-11-05 DIAGNOSIS — I10 Essential (primary) hypertension: Secondary | ICD-10-CM

## 2017-11-05 DIAGNOSIS — E669 Obesity, unspecified: Secondary | ICD-10-CM

## 2017-11-05 DIAGNOSIS — E7800 Pure hypercholesterolemia, unspecified: Secondary | ICD-10-CM | POA: Diagnosis not present

## 2017-11-05 DIAGNOSIS — R740 Nonspecific elevation of levels of transaminase and lactic acid dehydrogenase [LDH]: Secondary | ICD-10-CM

## 2017-11-05 LAB — CBC WITH DIFFERENTIAL/PLATELET
BASOS PCT: 0.9 % (ref 0.0–3.0)
Basophils Absolute: 0 10*3/uL (ref 0.0–0.1)
EOS PCT: 2.7 % (ref 0.0–5.0)
Eosinophils Absolute: 0.1 10*3/uL (ref 0.0–0.7)
HEMATOCRIT: 43.3 % (ref 36.0–46.0)
HEMOGLOBIN: 14.3 g/dL (ref 12.0–15.0)
Lymphocytes Relative: 32.5 % (ref 12.0–46.0)
Lymphs Abs: 1.4 10*3/uL (ref 0.7–4.0)
MCHC: 33.1 g/dL (ref 30.0–36.0)
MCV: 86.5 fl (ref 78.0–100.0)
MONOS PCT: 6.9 % (ref 3.0–12.0)
Monocytes Absolute: 0.3 10*3/uL (ref 0.1–1.0)
NEUTROS ABS: 2.5 10*3/uL (ref 1.4–7.7)
Neutrophils Relative %: 57 % (ref 43.0–77.0)
PLATELETS: 232 10*3/uL (ref 150.0–400.0)
RBC: 5.01 Mil/uL (ref 3.87–5.11)
RDW: 14.3 % (ref 11.5–15.5)
WBC: 4.4 10*3/uL (ref 4.0–10.5)

## 2017-11-05 LAB — COMPREHENSIVE METABOLIC PANEL
ALBUMIN: 4.3 g/dL (ref 3.5–5.2)
ALT: 33 U/L (ref 0–35)
AST: 21 U/L (ref 0–37)
Alkaline Phosphatase: 79 U/L (ref 39–117)
BUN: 13 mg/dL (ref 6–23)
CHLORIDE: 101 meq/L (ref 96–112)
CO2: 26 mEq/L (ref 19–32)
Calcium: 9.4 mg/dL (ref 8.4–10.5)
Creatinine, Ser: 0.85 mg/dL (ref 0.40–1.20)
GFR: 75.6 mL/min (ref >60.00)
Glucose, Bld: 221 mg/dL — ABNORMAL HIGH (ref 70–99)
POTASSIUM: 4.3 meq/L (ref 3.5–5.1)
SODIUM: 134 meq/L — AB (ref 135–145)
TOTAL PROTEIN: 6.9 g/dL (ref 6.0–8.3)
Total Bilirubin: 0.6 mg/dL (ref 0.2–1.2)

## 2017-11-05 LAB — LIPID PANEL
Cholesterol: 233 mg/dL — ABNORMAL HIGH (ref 0–200)
HDL: 48.1 mg/dL (ref >39.00)
LDL CALC: 157 mg/dL — AB (ref 0–99)
NONHDL: 184.79
Total CHOL/HDL Ratio: 5
Triglycerides: 138 mg/dL (ref 0.0–149.0)
VLDL: 27.6 mg/dL (ref 0.0–40.0)

## 2017-11-05 LAB — HEMOGLOBIN A1C: Hgb A1c MFr Bld: 8.8 % — ABNORMAL HIGH (ref 4.6–6.5)

## 2017-11-05 LAB — TSH: TSH: 3.63 u[IU]/mL (ref 0.35–4.50)

## 2017-11-05 MED ORDER — ENALAPRIL MALEATE 2.5 MG PO TABS: 2.5000 mg | ORAL_TABLET | Freq: Every day | ORAL | 1 refills | Status: DC

## 2017-11-05 NOTE — Patient Instructions (Signed)

## 2017-11-05 NOTE — Progress Notes (Signed)
OFFICE VISIT  11/05/2017   CC:  Chief Complaint  Patient presents with  . Annual Exam    Pt is fasting.    HPI:    Patient is a 47 y.o. Caucasian female who presents for annual health maintenance exam.  HTN: says bp always up at MD offices.  No checks at home.  Doesn't take bp med regularly. No side effects from med, just out of the habit of taking the med.  Exercise: wt lifting and tennis regularly. Diet: working on better food choices (lower carbs) and decreasing portion size. No home glucose checks, although she does have a meter.   Past Medical History:  Diagnosis Date  . Cholelithiasis without obstruction 09/19/09 (u/s)  . DEEP VENOUS THROMBOPHLEBITIS, HX OF 03/21/2010   Qualifier: Diagnosis of  By: Elease Hashimoto MD, Bruce    . Diabetes mellitus without complication ([place]) 05/15/5398  . Fatty liver 09/19/09 (u/s)   Mildly elevated transaminases  . GERD 03/21/2010  . History of herpes zoster 03/2012  . HYPERLIPIDEMIA 03/21/2010   Intol of simva, atorva, and rosuva---she declines further statin trial as of 10/2016  . HYPERTENSION 03/21/2010    Past Surgical History:  Procedure Laterality Date  . CESAREAN SECTION     2003, 2005  . LAPAROSCOPIC HYSTERECTOMY  02/2014   Ovaries remain (tubes out): done for DUB  . TUBAL LIGATION  2011   Dr. Reino Kent is her GYN.   Family History  Problem Relation Age of Onset  . Cancer Mother        colon, ovarian, lung  . Heart disease Mother   . Hypertension Mother   . Hyperlipidemia Mother   . Diabetes Mother        type ll   Social History   Socioeconomic History  . Marital status: Married    Spouse name: Not on file  . Number of children: Not on file  . Years of education: Not on file  . Highest education level: Not on file  Occupational History  . Not on file  Social Needs  . Financial resource strain: Not on file  . Food insecurity:    Worry: Not on file    Inability: Not on file  . Transportation needs:    Medical: Not on file     Non-medical: Not on file  Tobacco Use  . Smoking status: Never Smoker  . Smokeless tobacco: Never Used  Substance and Sexual Activity  . Alcohol use: No  . Drug use: No  . Sexual activity: Not on file  Lifestyle  . Physical activity:    Days per week: Not on file    Minutes per session: Not on file  . Stress: Not on file  Relationships  . Social connections:    Talks on phone: Not on file    Gets together: Not on file    Attends religious service: Not on file    Active member of club or organization: Not on file    Attends meetings of clubs or organizations: Not on file    Relationship status: Not on file  Other Topics Concern  . Not on file  Social History Narrative   Married, 1 child.   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   Orig from [place], currently living in [place].   No T/A/Ds.   Exercise: 1- 3 days a week, steps + cardio.    Outpatient Medications Prior to Visit  Medication Sig Dispense Refill  . glucose blood test strip  Use to check blood sugar twice daily 100 each 12  . Multiple Vitamins-Minerals (MULTIVITAMIN WITH MINERALS) tablet Take 1 tablet by mouth daily.    . ONE TOUCH LANCETS MISC Check blood sugar twice daily, as needed 100 each 12  . enalapril (VASOTEC) 2.5 MG tablet TAKE 1 TABLET (2.5 MG TOTAL) BY MOUTH DAILY. 30 tablet 0   No facility-administered medications prior to visit.     Allergies  Allergen Reactions  . Codeine Hives    ROS Review of Systems  Constitutional: Negative for appetite change, chills, fatigue and fever.  HENT: Negative for congestion, dental problem, ear pain and sore throat.   Eyes: Negative for discharge, redness and visual disturbance.  Respiratory: Negative for cough, chest tightness, shortness of breath and wheezing.   Cardiovascular: Negative for chest pain, palpitations and leg swelling.  Gastrointestinal: Negative for abdominal pain, blood in stool, diarrhea, nausea  and vomiting.  Genitourinary: Negative for difficulty urinating, dysuria, flank pain, frequency, hematuria and urgency.  Musculoskeletal: Negative for arthralgias, back pain, joint swelling, myalgias and neck stiffness.  Skin: Negative for pallor and rash.  Neurological: Negative for dizziness, speech difficulty, weakness and headaches.  Hematological: Negative for adenopathy. Does not bruise/bleed easily.  Psychiatric/Behavioral: Negative for confusion and sleep disturbance. The patient is not nervous/anxious.      PE: Blood pressure (!) 152/89, pulse 78, temperature 98.5 F (36.9 C), temperature source Oral, resp. rate 16, height 5' 3.5" (1.613 m), weight 217 lb (98.4 kg), last menstrual period 11/29/2013, SpO2 98 %. Body mass index is 37.84 kg/m. Exam chaperoned by Starla Link, CMA.  Gen: Alert, well appearing.  Patient is oriented to person, place, time, and situation. AFFECT: pleasant, lucid thought and speech. ENT: Ears: EACs clear, normal epithelium.  TMs with good light reflex and landmarks bilaterally.  Eyes: no injection, icteris, swelling, or exudate.  EOMI, PERRLA. Nose: no drainage or turbinate edema/swelling.  No injection or focal lesion.  Mouth: lips without lesion/swelling.  Oral mucosa pink and moist.  Dentition intact and without obvious caries or gingival swelling.  Oropharynx without erythema, exudate, or swelling.  Neck: supple/nontender.  No LAD, mass, or TM.  Carotid pulses 2+ bilaterally, without bruits. CV: RRR, no m/r/g.   LUNGS: CTA bilat, nonlabored resps, good aeration in all lung fields. ABD: soft, NT, ND, BS normal.  No hepatospenomegaly or mass.  No bruits. EXT: no clubbing, cyanosis, or edema.  Musculoskeletal: no joint swelling, erythema, warmth, or tenderness.  ROM of all joints intact. Skin - no sores or suspicious lesions or rashes or color changes Foot exam - bilateral normal; no swelling, tenderness or skin or vascular lesions. Color and temperature is  normal. Sensation is intact. Peripheral pulses are palpable. Toenails are normal.    LABS:  Lab Results  Component Value Date   TSH 2.18 10/29/2016   Lab Results  Component Value Date   WBC 4.4 10/29/2016   HGB 13.6 10/29/2016   HCT 41.4 10/29/2016   MCV 88.0 10/29/2016   PLT 216.0 10/29/2016   Lab Results  Component Value Date   CREATININE 0.72 10/29/2016   BUN 15 10/29/2016   NA 137 10/29/2016   K 4.0 10/29/2016   CL 106 10/29/2016   CO2 27 10/29/2016   Lab Results  Component Value Date   ALT 17 10/29/2016   AST 18 10/29/2016   ALKPHOS 63 10/29/2016   BILITOT 0.5 10/29/2016   Lab Results  Component Value Date   CHOL 211 (H) 10/29/2016  Lab Results  Component Value Date   HDL 45.30 10/29/2016   Lab Results  Component Value Date   LDLCALC 151 (H) 10/29/2016   Lab Results  Component Value Date   TRIG 74.0 10/29/2016   Lab Results  Component Value Date   CHOLHDL 5 10/29/2016   Lab Results  Component Value Date   HGBA1C 6.5 03/03/2017    IMPRESSION AND PLAN:  Health maintenance exam: Reviewed age and gender appropriate health maintenance issues (prudent diet, regular exercise, health risks of tobacco and excessive alcohol, use of seatbelts, fire alarms in home, use of sunscreen).  Also reviewed age and gender appropriate health screening as well as vaccine recommendations. Vaccines: pneumovax 23-->declines    Flu vaccine-->declines Labs: fasting HP + HbA1c (DM 2). Cervical ca screening: GYN MD=Dr. Leola Brazil per pt. Breast ca screening: GYN MD= Dr. Leola Brazil per pt. Colon ca screening: Due for initial screening colonoscopy: defers this until next CPE in 1 yr.  DM 2: she will get more strict with diet, start checking home gluc some. hbA1c today. Feet exam normal today. Eye exam UTD.  An After Visit Summary was printed and given to the patient.  FOLLOW UP: Return in about 6 months (around 05/08/2018) for routine chronic illness  f/u.  Signed:  Crissie Sickles, MD           11/05/2017

## 2017-11-09 ENCOUNTER — Telehealth: Payer: Self-pay | Admitting: Family Medicine

## 2017-11-09 ENCOUNTER — Other Ambulatory Visit: Payer: Self-pay | Admitting: *Deleted

## 2017-11-09 MED ORDER — GLUCOSE BLOOD VI STRP: ORAL_STRIP | 11 refills | Status: DC

## 2017-11-09 NOTE — Telephone Encounter (Signed)
Copied from [place] [id number]. Topic: Quick Communication - Rx Refill/Question >> Nov 09, 2017 12:40 PM Selinda Flavin B, NT wrote: Medication: glucose blood test strip (one touch ultra)  Has the patient contacted their pharmacy? Yes.   (Agent: If no, request that the patient contact the pharmacy for the refill.) (Agent: If yes, when and what did the pharmacy advise?)  Preferred Pharmacy (with phone number or street name): [id number] - [address], [address]  Agent: Please be advised that RX refills may take up to 3 business days. We ask that you follow-up with your pharmacy.

## 2017-11-10 ENCOUNTER — Other Ambulatory Visit: Payer: Self-pay | Admitting: *Deleted

## 2017-11-10 MED ORDER — GLUCOSE BLOOD VI STRP: ORAL_STRIP | 11 refills | Status: DC

## 2017-12-14 ENCOUNTER — Encounter: Payer: Self-pay | Admitting: Family Medicine

## 2018-05-06 ENCOUNTER — Encounter: Payer: Self-pay | Admitting: Family Medicine

## 2018-05-06 ENCOUNTER — Ambulatory Visit: Payer: BLUE CROSS/BLUE SHIELD | Admitting: Family Medicine

## 2018-05-06 NOTE — Progress Notes (Deleted)
OFFICE VISIT  05/06/2018   CC: No chief complaint on file.    HPI:    Patient is a 48 y.o. Caucasian female who presents for 6 mo f/u HTN, DM 2, obesity and NAFLD.  DM 2: last visit her A1c was up to 8.8% and I recommended she start metformin.  She declined, said she wanted just to work on diet/exercise/wt loss.  HTN:  Obesity/NAFLD:    Past Medical History:  Diagnosis Date  . Cholelithiasis without obstruction 09/19/09 (u/s)  . DEEP VENOUS THROMBOPHLEBITIS, HX OF 03/21/2010   Qualifier: Diagnosis of  By: Elease Hashimoto MD, Bruce    . Diabetes mellitus without complication ([place]) 10/21/6809  . Fatty liver 09/19/09 (u/s)   Mildly elevated transaminases  . GERD 03/21/2010  . History of herpes zoster 03/2012  . HYPERLIPIDEMIA 03/21/2010   Intol of simva, atorva, and rosuva---she declines further statin trial as of 10/2016  . HYPERTENSION 03/21/2010    Past Surgical History:  Procedure Laterality Date  . CESAREAN SECTION     2003, 2005  . LAPAROSCOPIC HYSTERECTOMY  02/2014   Ovaries remain (tubes out): done for DUB  . TUBAL LIGATION  2011   Dr. Reino Kent is her GYN.    Outpatient Medications Prior to Visit  Medication Sig Dispense Refill  . enalapril (VASOTEC) 2.5 MG tablet Take 1 tablet (2.5 mg total) by mouth daily. 90 tablet 1  . glucose blood test strip Use to check blood sugar twice daily 100 each 11  . Multiple Vitamins-Minerals (MULTIVITAMIN WITH MINERALS) tablet Take 1 tablet by mouth daily.    . ONE TOUCH LANCETS MISC Check blood sugar twice daily, as needed 100 each 12   No facility-administered medications prior to visit.     Allergies  Allergen Reactions  . Codeine Hives    ROS As per HPI  PE: Last menstrual period 11/29/2013. ***  LABS:    Chemistry      Component Value Date/Time   NA 134 (L) 11/05/2017 0924   K 4.3 11/05/2017 0924   CL 101 11/05/2017 0924   CO2 26 11/05/2017 0924   BUN 13 11/05/2017 0924   CREATININE 0.85 11/05/2017 0924   CREATININE  0.63 08/04/2014 1543      Component Value Date/Time   CALCIUM 9.4 11/05/2017 0924   ALKPHOS 79 11/05/2017 0924   AST 21 11/05/2017 0924   ALT 33 11/05/2017 0924   BILITOT 0.6 11/05/2017 0924     Lab Results  Component Value Date   CHOL 233 (H) 11/05/2017   HDL 48.10 11/05/2017   LDLCALC 157 (H) 11/05/2017   LDLDIRECT 157.0 11/03/2014   TRIG 138.0 11/05/2017   CHOLHDL 5 11/05/2017   Lab Results  Component Value Date   HGBA1C 8.8 (H) 11/05/2017    IMPRESSION AND PLAN:  No problem-specific Assessment & Plan notes found for this encounter.   An After Visit Summary was printed and given to the patient.  FOLLOW UP: No follow-ups on file.  Signed:  Crissie Sickles, MD           05/06/2018

## 2018-06-28 ENCOUNTER — Ambulatory Visit: Payer: Self-pay | Admitting: *Deleted

## 2018-06-28 ENCOUNTER — Encounter: Payer: Self-pay | Admitting: Family Medicine

## 2018-06-28 ENCOUNTER — Ambulatory Visit (INDEPENDENT_AMBULATORY_CARE_PROVIDER_SITE_OTHER): Payer: BLUE CROSS/BLUE SHIELD | Admitting: Family Medicine

## 2018-06-28 ENCOUNTER — Other Ambulatory Visit: Payer: Self-pay

## 2018-06-28 VITALS — BP 152/109 | HR 81 | Ht 63.5 in

## 2018-06-28 DIAGNOSIS — Z9119 Patient's noncompliance with other medical treatment and regimen: Secondary | ICD-10-CM | POA: Diagnosis not present

## 2018-06-28 DIAGNOSIS — R0789 Other chest pain: Secondary | ICD-10-CM | POA: Diagnosis not present

## 2018-06-28 DIAGNOSIS — E119 Type 2 diabetes mellitus without complications: Secondary | ICD-10-CM | POA: Diagnosis not present

## 2018-06-28 DIAGNOSIS — I10 Essential (primary) hypertension: Secondary | ICD-10-CM | POA: Diagnosis not present

## 2018-06-28 DIAGNOSIS — Z91199 Patient's noncompliance with other medical treatment and regimen due to unspecified reason: Secondary | ICD-10-CM

## 2018-06-28 MED ORDER — ENALAPRIL MALEATE 10 MG PO TABS: 10.0000 mg | ORAL_TABLET | Freq: Every day | ORAL | 0 refills | Status: DC

## 2018-06-28 NOTE — Telephone Encounter (Signed)
Noted  
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
## 2018-06-28 NOTE — Telephone Encounter (Signed)
Chest discomfort around 4:00am this morning. It is a sharp intermittent pain that last about one minute and resolves itself.Does not radiate, no SOB no fever Doing a lot of yard work over the weekend. Today, while not having the pain she bent over and noticed pain started again but it is now a dull ache.Also experiencing headaches and lightheadedness along with blurred vision intermittently with this b/p.  Blood pressure 152/109 left arm, 168/107 right arm at 7:45 this morning. Now, HR 85 b/p 174/109. Started enalapril 2.5 mg on June 22, 2018. Stated on April 6 she took a double dose due to 154/106. Started keto diet on 4/7. Did the diet in the past and lost weight and went off medications. Gained about 20 pounds while not on the diet and pressure back up. Needs a refill on enalapril. Reviewed information on hypertension and the effects it has on our heart. Reviewed all symptoms of high blood pressure. Stated she understood.No travels/no known exposures.Routing to PCP. Prefers email visit [email or web address] Patient will take the 10:30a virtual visit.  Reason for Disposition . [1] Chest pain lasting <= 5 minutes AND [2] NO chest pain or cardiac symptoms now(Exceptions: pains lasting a few seconds)  Answer Assessment - Initial Assessment Questions 1. LOCATION: "Where does it hurt?"       Left side behind 2. RADIATION: "Does the pain go anywhere else?" (e.g., into neck, jaw, arms, back)     no 3. ONSET: "When did the chest pain begin?" (Minutes, hours or days)       4. PATTERN "Does the pain come and go, or has it been constant since it started?"  "Does it get worse with exertion?"      Comes and goes 5. DURATION: "How long does it last" (e.g., seconds, minutes, hours)     About one minute 6. SEVERITY: "How bad is the pain?"  (e.g., Scale 1-10; mild, moderate, or severe)    - MILD (1-3): doesn't interfere with normal activities     - MODERATE (4-7): interferes with normal activities or  awakens from sleep    - SEVERE (8-10): excruciating pain, unable to do any normal activities      3 7. CARDIAC RISK FACTORS: "Do you have any history of heart problems or risk factors for heart disease?" (e.g., prior heart attack, angina; high blood pressure, diabetes, being overweight, high cholesterol, smoking, or strong family history of heart disease)     htn high cholesterol, DM 8. PULMONARY RISK FACTORS: "Do you have any history of lung disease?"  (e.g., blood clots in lung, asthma, emphysema, birth control pills)     no 9. CAUSE: "What do you think is causing the chest pain?"    Yard work, raking over the weekend. 10. OTHER SYMPTOMS: "Do you have any other symptoms?" (e.g., dizziness, nausea, vomiting, sweating, fever, difficulty breathing, cough)      Lightheaded and headache for past couple days. 11. PREGNANCY: "Is there any chance you are pregnant?" "When was your last menstrual period?"      no  Protocols used: CHEST PAIN-A-AH

## 2018-06-30 ENCOUNTER — Other Ambulatory Visit: Payer: Self-pay

## 2018-06-30 ENCOUNTER — Other Ambulatory Visit (INDEPENDENT_AMBULATORY_CARE_PROVIDER_SITE_OTHER): Payer: BLUE CROSS/BLUE SHIELD

## 2018-06-30 DIAGNOSIS — E119 Type 2 diabetes mellitus without complications: Secondary | ICD-10-CM | POA: Diagnosis not present

## 2018-06-30 DIAGNOSIS — I10 Essential (primary) hypertension: Secondary | ICD-10-CM

## 2018-06-30 LAB — BASIC METABOLIC PANEL
BUN: 14 mg/dL (ref 6–23)
CO2: 24 mEq/L (ref 19–32)
Calcium: 9 mg/dL (ref 8.4–10.5)
Chloride: 102 mEq/L (ref 96–112)
Creatinine, Ser: 0.64 mg/dL (ref 0.40–1.20)
GFR: 98.43 mL/min (ref >60.00)
Glucose, Bld: 192 mg/dL — ABNORMAL HIGH (ref 70–99)
Potassium: 4.4 mEq/L (ref 3.5–5.1)
Sodium: 135 mEq/L (ref 135–145)

## 2018-06-30 LAB — HEMOGLOBIN A1C: Hgb A1c MFr Bld: 10.2 % — ABNORMAL HIGH (ref 4.6–6.5)

## 2018-07-08 ENCOUNTER — Encounter: Payer: Self-pay | Admitting: Family Medicine

## 2018-07-08 ENCOUNTER — Ambulatory Visit (INDEPENDENT_AMBULATORY_CARE_PROVIDER_SITE_OTHER): Payer: BLUE CROSS/BLUE SHIELD | Admitting: Family Medicine

## 2018-07-08 ENCOUNTER — Other Ambulatory Visit: Payer: Self-pay

## 2018-07-08 VITALS — BP 135/97 | HR 85

## 2018-07-08 DIAGNOSIS — E119 Type 2 diabetes mellitus without complications: Secondary | ICD-10-CM

## 2018-07-08 DIAGNOSIS — R739 Hyperglycemia, unspecified: Secondary | ICD-10-CM

## 2018-07-08 DIAGNOSIS — I10 Essential (primary) hypertension: Secondary | ICD-10-CM

## 2018-07-08 MED ORDER — ENALAPRIL MALEATE 20 MG PO TABS: 20.0000 mg | ORAL_TABLET | Freq: Every day | ORAL | 0 refills | Status: DC

## 2018-07-08 NOTE — Progress Notes (Signed)
Virtual Visit via Video Note  I connected with pt  on 07/08/18 at 10:00 AM EDT by a video enabled telemedicine application and verified that I am speaking with the correct person using two identifiers.  Location patient: work [employer] office Persons participating in the virtual visit: patient, provider  I discussed the limitations of evaluation and management by telemedicine and the availability of in person appointments. The patient expressed understanding and agreed to proceed.  Telemedicine visit is a necessity given the COVID-19 restrictions in place at the current time.  HPI: 48 y/o WF here for 10d f/u HTN and atypical CP. Feeling MUCH better.  Has had NO chest pains since last f/u visit. No new complaints.  HTN: Enalapril increased to 10mg  qd last visit: bp checks in the last 10d-->max syst 167, max diast 109. HR highest 90.   Average syst 142, diast avg 99, HR average 79.  DM2: A1c was 10.2% last visit 10d ago. She is always hesitant to get aggressive with tx, so I started with the recommendation of getting back on metformin 500 mg bid, and continue the diet that she has recently restarted. She wanted to WAIT and see how her glucoses were for a while on just the diet before getting back on metformin. Glucose checks since last visit: avg fasting 191.  N  ROS: See pertinent positives and negatives per HPI.  Past Medical History:  Diagnosis Date  . Cholelithiasis without obstruction 09/19/09 (u/s)  . DEEP VENOUS THROMBOPHLEBITIS, HX OF 03/21/2010   Qualifier: Diagnosis of  By: Elease Hashimoto MD, Bruce    . Diabetes mellitus without complication ([place]) 0/03/7508  . Fatty liver 09/19/09 (u/s)   Mildly elevated transaminases  . GERD 03/21/2010  . History of herpes zoster 03/2012  . HYPERLIPIDEMIA 03/21/2010   Intol of simva, atorva, and rosuva---she declines further statin trial as of 10/2016  . HYPERTENSION 03/21/2010    Past Surgical History:  Procedure Laterality Date  .  CESAREAN SECTION     2003, 2005  . LAPAROSCOPIC HYSTERECTOMY  02/2014   Ovaries remain (tubes out): done for DUB  . TUBAL LIGATION  2011   Dr. Reino Kent is her GYN.    Family History  Problem Relation Age of Onset  . Cancer Mother        colon, ovarian, lung  . Heart disease Mother   . Hypertension Mother   . Hyperlipidemia Mother   . Diabetes Mother        type ll      Current Outpatient Medications:  .  enalapril (VASOTEC) 10 MG tablet, Take 1 tablet (10 mg total) by mouth daily., Disp: 30 tablet, Rfl: 0 .  glucose blood test strip, Use to check blood sugar twice daily, Disp: 100 each, Rfl: 11 .  Multiple Vitamins-Minerals (MULTIVITAMIN WITH MINERALS) tablet, Take 1 tablet by mouth daily., Disp: , Rfl:  .  ONE TOUCH LANCETS MISC, Check blood sugar twice daily, as needed, Disp: 100 each, Rfl: 12  EXAM:  VITALS per patient if applicable:BP (!) 258/52 (BP Location: Left Arm, Patient Position: Sitting, Cuff Size: Large)   Pulse 85   LMP 11/29/2013    GENERAL: alert, oriented, appears well and in no acute distress  HEENT: atraumatic, conjunttiva clear, no obvious abnormalities on inspection of external nose and ears  NECK: normal movements of the head and neck  LUNGS: on inspection no signs of respiratory distress, breathing rate appears normal, no obvious gross SOB, gasping or wheezing  CV: no  obvious cyanosis  MS: moves all visible extremities without noticeable abnormality  PSYCH/NEURO: pleasant and cooperative, no obvious depression or anxiety, speech and thought processing grossly intact  LABS: none today   Chemistry      Component Value Date/Time   NA 135 06/30/2018 0951   K 4.4 06/30/2018 0951   CL 102 06/30/2018 0951   CO2 24 06/30/2018 0951   BUN 14 06/30/2018 0951   CREATININE 0.64 06/30/2018 0951   CREATININE 0.63 08/04/2014 1543      Component Value Date/Time   CALCIUM 9.0 06/30/2018 0951   ALKPHOS 79 11/05/2017 0924   AST 21 11/05/2017 0924   ALT  33 11/05/2017 0924   BILITOT 0.6 11/05/2017 0924     Lab Results  Component Value Date   HGBA1C 10.2 (H) 06/30/2018   Lab Results  Component Value Date   CHOL 233 (H) 11/05/2017   HDL 48.10 11/05/2017   LDLCALC 157 (H) 11/05/2017   LDLDIRECT 157.0 11/03/2014   TRIG 138.0 11/05/2017   CHOLHDL 5 11/05/2017   Lab Results  Component Value Date   TSH 3.63 11/05/2017   Lab Results  Component Value Date   WBC 4.4 11/05/2017   HGB 14.3 11/05/2017   HCT 43.3 11/05/2017   MCV 86.5 11/05/2017   PLT 232.0 11/05/2017    ASSESSMENT AND PLAN:  Discussed the following assessment and plan:  1) Uncontrolled HTN: some improvement with increase of enalapril from 5 to 10mg  qd, but significant room for improvement.  Increase enalapril to 20mg  qd. Continue to work on diet/wt loss, low Na intake. Continue home bp/hr monitoring regularly. Goal 130/80 discussed with pt today.  2) DM 2, poor control. Pt has always been psychologically resistant to medication treatment for this. Despite my recommendation to get back on med (at least metformin 500 mg bid) at this time, she prefers to continue to give her diet/exercise changes time to see if this is sufficient. She'll continue fasting (and occasional 2H PP) glucose monitoring and we'll review these numbers again in 38mo.    I discussed the assessment and treatment plan with the patient. The patient was provided an opportunity to ask questions and all were answered. The patient agreed with the plan and demonstrated an understanding of the instructions.   The patient was advised to call back or seek an in-person evaluation if the symptoms worsen or if the condition fails to improve as anticipated.  F/u: 1 mo, virtual visit-->f/u DM and HTN.  Signed:  Crissie Sickles, MD           07/08/2018

## 2018-07-22 ENCOUNTER — Other Ambulatory Visit: Payer: Self-pay | Admitting: Family Medicine

## 2018-08-01 ENCOUNTER — Other Ambulatory Visit: Payer: Self-pay | Admitting: Family Medicine

## 2018-08-02 NOTE — Telephone Encounter (Signed)
Contacted patient to schedule VV as soon as possible.  Enalapril 20mg  refilled for #30 days with No refill.

## 2018-08-12 ENCOUNTER — Ambulatory Visit: Payer: BLUE CROSS/BLUE SHIELD | Admitting: Family Medicine

## 2018-08-13 ENCOUNTER — Ambulatory Visit (INDEPENDENT_AMBULATORY_CARE_PROVIDER_SITE_OTHER): Payer: BLUE CROSS/BLUE SHIELD | Admitting: Family Medicine

## 2018-08-13 ENCOUNTER — Encounter: Payer: Self-pay | Admitting: Family Medicine

## 2018-08-13 ENCOUNTER — Other Ambulatory Visit: Payer: Self-pay

## 2018-08-13 VITALS — BP 143/93

## 2018-08-13 DIAGNOSIS — E1165 Type 2 diabetes mellitus with hyperglycemia: Secondary | ICD-10-CM | POA: Diagnosis not present

## 2018-08-13 DIAGNOSIS — I10 Essential (primary) hypertension: Secondary | ICD-10-CM | POA: Diagnosis not present

## 2018-08-13 MED ORDER — METFORMIN HCL 500 MG PO TABS: 500.0000 mg | ORAL_TABLET | Freq: Two times a day (BID) | ORAL | 3 refills | Status: DC

## 2018-08-13 MED ORDER — ENALAPRIL MALEATE 20 MG PO TABS: ORAL_TABLET | ORAL | 0 refills | Status: DC

## 2018-08-13 NOTE — Progress Notes (Signed)
Virtual Visit via Video Note  I connected with pt on 08/13/18 at  9:20 AM EDT by a video enabled telemedicine application and verified that I am speaking with the correct person using two identifiers.  Location patient: home Location provider:work or home office Persons participating in the virtual visit: patient, provider  I discussed the limitations of evaluation and management by telemedicine and the availability of in person appointments. The patient expressed understanding and agreed to proceed.  Telemedicine visit is a necessity given the COVID-19 restrictions in place at the current time.  HPI: 48 y/o WF being seen today for 1 mo f/u HTN and DM 2. She is feeling well.  HTN: Last visit I increased her enalapril from 10mg  to 20mg  qd. Her avg bp was 140s/90s at that time. Home bp's since last f/u visit: avg syst 140, avg diast 95.  Avg HR 72.  DM: pt declines meds, insists on working on diet/exercise/wt loss even though she has not succeeded with this in the past. Home glucoses since last visit: 190-mid 200s, fasting.  Avg 200. Eating low carb "most of the time".  ROS: no CP, no SOB, no wheezing, no cough, no dizziness, no HAs, no rashes, no melena/hematochezia.  No polyuria or polydipsia.  No myalgias or arthralgias.   Past Medical History:  Diagnosis Date  . Cholelithiasis without obstruction 09/19/09 (u/s)  . DEEP VENOUS THROMBOPHLEBITIS, HX OF 03/21/2010   Qualifier: Diagnosis of  By: Elease Hashimoto MD, Bruce    . Diabetes mellitus without complication ([place]) 06/20/2701  . Fatty liver 09/19/09 (u/s)   Mildly elevated transaminases  . GERD 03/21/2010  . History of herpes zoster 03/2012  . HYPERLIPIDEMIA 03/21/2010   Intol of simva, atorva, and rosuva---she declines further statin trial as of 10/2016  . HYPERTENSION 03/21/2010    Past Surgical History:  Procedure Laterality Date  . CESAREAN SECTION     2003, 2005  . LAPAROSCOPIC HYSTERECTOMY  02/2014   Ovaries remain (tubes out): done  for DUB  . TUBAL LIGATION  2011   Dr. Reino Kent is her GYN.    Family History  Problem Relation Age of Onset  . Cancer Mother        colon, ovarian, lung  . Heart disease Mother   . Hypertension Mother   . Hyperlipidemia Mother   . Diabetes Mother        type ll     Current Outpatient Medications:  .  chlorhexidine (PERIDEX) 0.12 % solution, Use twice daily, Disp: , Rfl:  .  enalapril (VASOTEC) 20 MG tablet, TAKE 1 TABLET BY MOUTH EVERY DAY, Disp: 30 tablet, Rfl: 0 .  glucose blood test strip, Use to check blood sugar twice daily, Disp: 100 each, Rfl: 11 .  Multiple Vitamins-Minerals (MULTIVITAMIN WITH MINERALS) tablet, Take 1 tablet by mouth daily., Disp: , Rfl:  .  ONE TOUCH LANCETS MISC, Check blood sugar twice daily, as needed, Disp: 100 each, Rfl: 12 .  etodolac (LODINE) 400 MG tablet, , Disp: , Rfl:   EXAM:  VITALS per patient if applicable:  GENERAL: alert, oriented, appears well and in no acute distress  HEENT: atraumatic, conjunttiva clear, no obvious abnormalities on inspection of external nose and ears  NECK: normal movements of the head and neck  LUNGS: on inspection no signs of respiratory distress, breathing rate appears normal, no obvious gross SOB, gasping or wheezing  CV: no obvious cyanosis  MS: moves all visible extremities without noticeable abnormality  PSYCH/NEURO: pleasant and cooperative,  no obvious depression or anxiety, speech and thought processing grossly intact  LABS: none today    Chemistry      Component Value Date/Time   NA 135 06/30/2018 0951   K 4.4 06/30/2018 0951   CL 102 06/30/2018 0951   CO2 24 06/30/2018 0951   BUN 14 06/30/2018 0951   CREATININE 0.64 06/30/2018 0951   CREATININE 0.63 08/04/2014 1543      Component Value Date/Time   CALCIUM 9.0 06/30/2018 0951   ALKPHOS 79 11/05/2017 0924   AST 21 11/05/2017 0924   ALT 33 11/05/2017 0924   BILITOT 0.6 11/05/2017 0924     Lab Results  Component Value Date   HGBA1C  10.2 (H) 06/30/2018    ASSESSMENT AND PLAN:  Discussed the following assessment and plan:  1) Uncontrolled HTN: bp's about the same. Increase enalapril to 40mg  qd. Continue home bp monitoring.  Goal <130/80.  2) DM 2, poor control. Pt has been very resistant to med trials in the past but is ok with restart of metformin 500 mg bid at this time.  She will also continue to do her best at low carb diet.  I discussed the assessment and treatment plan with the patient. The patient was provided an opportunity to ask questions and all were answered. The patient agreed with the plan and demonstrated an understanding of the instructions.   The patient was advised to call back or seek an in-person evaluation if the symptoms worsen or if the condition fails to improve as anticipated.  F/u: 1 mo vv, f/u HTN and DM.  Signed:  Crissie Sickles, MD           08/13/2018

## 2018-09-04 ENCOUNTER — Other Ambulatory Visit: Payer: Self-pay | Admitting: Family Medicine

## 2018-09-06 NOTE — Telephone Encounter (Signed)
Patient has enough enalopril to get her until her appt.

## 2018-09-08 ENCOUNTER — Encounter: Payer: Self-pay | Admitting: Family Medicine

## 2018-09-13 ENCOUNTER — Ambulatory Visit (INDEPENDENT_AMBULATORY_CARE_PROVIDER_SITE_OTHER): Payer: BC Managed Care – PPO | Admitting: Family Medicine

## 2018-09-13 ENCOUNTER — Encounter: Payer: Self-pay | Admitting: Family Medicine

## 2018-09-13 ENCOUNTER — Other Ambulatory Visit: Payer: Self-pay

## 2018-09-13 DIAGNOSIS — I10 Essential (primary) hypertension: Secondary | ICD-10-CM

## 2018-09-13 MED ORDER — METOPROLOL TARTRATE 25 MG PO TABS: 25.0000 mg | ORAL_TABLET | Freq: Two times a day (BID) | ORAL | 0 refills | Status: DC

## 2018-09-13 MED ORDER — ENALAPRIL MALEATE 20 MG PO TABS: ORAL_TABLET | ORAL | 1 refills | Status: DC

## 2018-09-13 NOTE — Progress Notes (Signed)
Virtual Visit via Video Note  I connected with pt on 09/13/18 at 11:00 AM EDT by a video enabled telemedicine application but could never get video aspect to work adequately, so this ended up being a telephone-only encounter-->and verified that I am speaking with the correct person using two identifiers.  Location patient: home Location provider:work or home office Persons participating in the virtual visit: patient, provider  I discussed the limitations of evaluation and management by telemedicine/telephone and the availability of in person appointments. The patient expressed understanding and agreed to proceed.  Telemedicine/telephone visit is a necessity given the COVID-19 restrictions in place at the current time.  HPI: 48 y/o WF with whom I am doing a telephone visit today (due to COVID-19 pandemic restrictions) for 1 mo f/u uncontrolled HTN. I doubled her enalapril to 40mg  qd last visit.  She feels good. Avg syst 132, avg diast 92, HR avg 82.   ROS: See pertinent positives and negatives per HPI.  Past Medical History:  Diagnosis Date  . Cholelithiasis without obstruction 09/19/09 (u/s)  . DEEP VENOUS THROMBOPHLEBITIS, HX OF 03/21/2010   Qualifier: Diagnosis of  By: Elease Hashimoto MD, Bruce    . Diabetes mellitus without complication ([place]) 09/21/4126  . Fatty liver 09/19/09 (u/s)   Mildly elevated transaminases  . GERD 03/21/2010  . History of herpes zoster 03/2012  . HYPERLIPIDEMIA 03/21/2010   Intol of simva, atorva, and rosuva---she declines further statin trial as of 10/2016  . HYPERTENSION 03/21/2010    Past Surgical History:  Procedure Laterality Date  . CESAREAN SECTION     2003, 2005  . LAPAROSCOPIC HYSTERECTOMY  02/2014   Ovaries remain (tubes out): done for DUB  . TUBAL LIGATION  2011   Dr. Reino Kent is her GYN.    Family History  Problem Relation Age of Onset  . Cancer Mother        colon, ovarian, lung  . Heart disease Mother   . Hypertension Mother   . Hyperlipidemia  Mother   . Diabetes Mother        type ll     Current Outpatient Medications:  .  enalapril (VASOTEC) 20 MG tablet, 2 tabs po qd, Disp: 30 tablet, Rfl: 0 .  glucose blood test strip, Use to check blood sugar twice daily, Disp: 100 each, Rfl: 11 .  metFORMIN (GLUCOPHAGE) 500 MG tablet, Take 1 tablet (500 mg total) by mouth 2 (two) times daily with a meal., Disp: 60 tablet, Rfl: 3 .  Multiple Vitamins-Minerals (MULTIVITAMIN WITH MINERALS) tablet, Take 1 tablet by mouth daily., Disp: , Rfl:  .  ONE TOUCH LANCETS MISC, Check blood sugar twice daily, as needed, Disp: 100 each, Rfl: 12 .  chlorhexidine (PERIDEX) 0.12 % solution, Use twice daily, Disp: , Rfl:  .  etodolac (LODINE) 400 MG tablet, , Disp: , Rfl:   EXAM:  VITALS per patient if applicable:LMP 78/67/6720   GENERAL: alert, oriented, sounds well and in no acute distress.  Lucid thought and speech. No further exam b/c telephone encounter.  LABS: none today    Chemistry      Component Value Date/Time   NA 135 06/30/2018 0951   K 4.4 06/30/2018 0951   CL 102 06/30/2018 0951   CO2 24 06/30/2018 0951   BUN 14 06/30/2018 0951   CREATININE 0.64 06/30/2018 0951   CREATININE 0.63 08/04/2014 1543      Component Value Date/Time   CALCIUM 9.0 06/30/2018 0951   ALKPHOS 79 11/05/2017 0924  AST 21 11/05/2017 0924   ALT 33 11/05/2017 0924   BILITOT 0.6 11/05/2017 0924      ASSESSMENT AND PLAN:  Discussed the following assessment and plan:  1) HTN: systolic controlled but diastolic still avg 92.  HR is fine to add BB. Will add lopressor 25mg  bid. Continue enalapril TWO of the 20mg  tab qd.  2) DM: Of note, she states she is taking her metformin since last visit w/out problem.   I discussed the assessment and treatment plan with the patient. The patient was provided an opportunity to ask questions and all were answered. The patient agreed with the plan and demonstrated an understanding of the instructions.   The patient was  advised to call back or seek an in-person evaluation if the symptoms worsen or if the condition fails to improve as anticipated. Spent 10 min with pt today, with >50% of this time spent in counseling and care coordination regarding the above problems.  F/u: 1 mo virtual  Signed:  Crissie Sickles, MD           09/13/2018

## 2018-10-07 ENCOUNTER — Ambulatory Visit: Payer: BC Managed Care – PPO | Admitting: Family Medicine

## 2018-10-07 ENCOUNTER — Other Ambulatory Visit: Payer: Self-pay

## 2018-10-10 ENCOUNTER — Other Ambulatory Visit: Payer: Self-pay | Admitting: Family Medicine

## 2018-10-19 ENCOUNTER — Other Ambulatory Visit: Payer: Self-pay

## 2018-10-19 ENCOUNTER — Encounter: Payer: Self-pay | Admitting: Family Medicine

## 2018-10-19 ENCOUNTER — Ambulatory Visit (INDEPENDENT_AMBULATORY_CARE_PROVIDER_SITE_OTHER): Payer: BC Managed Care – PPO | Admitting: Family Medicine

## 2018-10-19 DIAGNOSIS — I10 Essential (primary) hypertension: Secondary | ICD-10-CM

## 2018-10-19 DIAGNOSIS — E119 Type 2 diabetes mellitus without complications: Secondary | ICD-10-CM

## 2018-10-19 NOTE — Progress Notes (Signed)
Virtual Visit via Video Note  I connected with Kristen Meyers on 10/19/18 at 11:30 AM EDT by telephone (video enabled telemedicine application had technical difficulties today) and verified that I am speaking with the correct person using two identifiers.  Location patient: home Location provider:work or home office Persons participating in the virtual visit: patient, provider  I discussed the limitations of evaluation and management by telephone and the availability of in person appointments. The patient expressed understanding and agreed to proceed.  Telephone visit is a necessity given the COVID-19 restrictions in place at the current time.  HPI: 48 y/o WF being seen today for 1 mo f/u uncontrolled HTN. Last visit I added lopressor 25mg  bid to the 40mg  enalapril she was already taking. Her bp avg at that time was 130s/90s.  HR avg low 80s.  Feeling well. Has not been checking any bp's. Feels better since getting on lopressor, thinks her HR is down some and likes how things are going at this time.   Past Medical History:  Diagnosis Date  . Cholelithiasis without obstruction 09/19/09 (u/s)  . DEEP VENOUS THROMBOPHLEBITIS, HX OF 03/21/2010   Qualifier: Diagnosis of  By: Elease Hashimoto MD, Bruce    . Diabetes mellitus without complication ([place]) 04/17/3084  . Fatty liver 09/19/09 (u/s)   Mildly elevated transaminases  . GERD 03/21/2010  . History of herpes zoster 03/2012  . HYPERLIPIDEMIA 03/21/2010   Intol of simva, atorva, and rosuva---she declines further statin trial as of 10/2016  . HYPERTENSION 03/21/2010    Past Surgical History:  Procedure Laterality Date  . CESAREAN SECTION     2003, 2005  . LAPAROSCOPIC HYSTERECTOMY  02/2014   Ovaries remain (tubes out): done for DUB  . TUBAL LIGATION  2011   Dr. Reino Kent is her GYN.    Family History  Problem Relation Age of Onset  . Cancer Mother        colon, ovarian, lung  . Heart disease Mother   . Hypertension Mother   . Hyperlipidemia Mother   .  Diabetes Mother        type ll      Current Outpatient Medications:  .  chlorhexidine (PERIDEX) 0.12 % solution, Use twice daily, Disp: , Rfl:  .  enalapril (VASOTEC) 20 MG tablet, 2 tabs po qd, Disp: 180 tablet, Rfl: 1 .  glucose blood test strip, Use to check blood sugar twice daily, Disp: 100 each, Rfl: 11 .  metFORMIN (GLUCOPHAGE) 500 MG tablet, Take 1 tablet (500 mg total) by mouth 2 (two) times daily with a meal., Disp: 60 tablet, Rfl: 3 .  metoprolol tartrate (LOPRESSOR) 25 MG tablet, TAKE 1 TABLET BY MOUTH TWICE A DAY, Disp: 60 tablet, Rfl: 0 .  Multiple Vitamins-Minerals (MULTIVITAMIN WITH MINERALS) tablet, Take 1 tablet by mouth daily., Disp: , Rfl:  .  ONE TOUCH LANCETS MISC, Check blood sugar twice daily, as needed, Disp: 100 each, Rfl: 12 .  etodolac (LODINE) 400 MG tablet, , Disp: , Rfl:   EXAM:  VITALS per patient if applicable: LMP 57/84/6962    GENERAL: alert, oriented, appears well and in no acute distress  HEENT: atraumatic, conjunttiva clear, no obvious abnormalities on inspection of external nose and ears  NECK: normal movements of the head and neck  LUNGS: on inspection no signs of respiratory distress, breathing rate appears normal, no obvious gross SOB, gasping or wheezing  CV: no obvious cyanosis  MS: moves all visible extremities without noticeable abnormality  PSYCH/NEURO: pleasant and cooperative,  no obvious depression or anxiety, speech and thought processing grossly intact  LABS: none today    Chemistry      Component Value Date/Time   NA 135 06/30/2018 0951   K 4.4 06/30/2018 0951   CL 102 06/30/2018 0951   CO2 24 06/30/2018 0951   BUN 14 06/30/2018 0951   CREATININE 0.64 06/30/2018 0951   CREATININE 0.63 08/04/2014 1543      Component Value Date/Time   CALCIUM 9.0 06/30/2018 0951   ALKPHOS 79 11/05/2017 0924   AST 21 11/05/2017 0924   ALT 33 11/05/2017 0924   BILITOT 0.6 11/05/2017 0924     Lab Results  Component Value Date    CHOL 233 (H) 11/05/2017   HDL 48.10 11/05/2017   LDLCALC 157 (H) 11/05/2017   LDLDIRECT 157.0 11/03/2014   TRIG 138.0 11/05/2017   CHOLHDL 5 11/05/2017   Lab Results  Component Value Date   HGBA1C 10.2 (H) 06/30/2018   Lab Results  Component Value Date   TSH 3.63 11/05/2017   Lab Results  Component Value Date   WBC 4.4 11/05/2017   HGB 14.3 11/05/2017   HCT 43.3 11/05/2017   MCV 86.5 11/05/2017   PLT 232.0 11/05/2017    ASSESSMENT AND PLAN:  Discussed the following assessment and plan:  1) HTN: no recent bp numbers since getting on lopressor. She does not feeling better since getting on it though. Continue current meds at current doses.    I discussed the assessment and treatment plan with the patient. The patient was provided an opportunity to ask questions and all were answered. The patient agreed with the plan and demonstrated an understanding of the instructions.   The patient was advised to call back or seek an in-person evaluation if the symptoms worsen or if the condition fails to improve as anticipated.  Spent 10 min with Kristen Meyers today, with >50% of this time spent in counseling and care coordination regarding the above problems.  F/u:  CPE with fasting labs/microalb/cr 1 mo-->orders have been placed (future) today.  Signed:  Crissie Sickles, MD           10/19/2018

## 2018-11-15 ENCOUNTER — Other Ambulatory Visit: Payer: Self-pay

## 2018-11-15 MED ORDER — METOPROLOL TARTRATE 25 MG PO TABS: 25.0000 mg | ORAL_TABLET | Freq: Two times a day (BID) | ORAL | 5 refills | Status: DC

## 2018-12-08 ENCOUNTER — Encounter: Payer: Self-pay | Admitting: Gastroenterology

## 2018-12-08 ENCOUNTER — Ambulatory Visit (INDEPENDENT_AMBULATORY_CARE_PROVIDER_SITE_OTHER): Payer: BC Managed Care – PPO | Admitting: Family Medicine

## 2018-12-08 ENCOUNTER — Encounter: Payer: Self-pay | Admitting: Family Medicine

## 2018-12-08 ENCOUNTER — Other Ambulatory Visit: Payer: Self-pay

## 2018-12-08 VITALS — BP 130/85 | HR 81 | Temp 98.0°F | Resp 16 | Ht 63.5 in | Wt 220.4 lb

## 2018-12-08 DIAGNOSIS — Z1283 Encounter for screening for malignant neoplasm of skin: Secondary | ICD-10-CM | POA: Diagnosis not present

## 2018-12-08 DIAGNOSIS — Z0000 Encounter for general adult medical examination without abnormal findings: Secondary | ICD-10-CM

## 2018-12-08 DIAGNOSIS — E7800 Pure hypercholesterolemia, unspecified: Secondary | ICD-10-CM

## 2018-12-08 DIAGNOSIS — I10 Essential (primary) hypertension: Secondary | ICD-10-CM | POA: Diagnosis not present

## 2018-12-08 DIAGNOSIS — E119 Type 2 diabetes mellitus without complications: Secondary | ICD-10-CM

## 2018-12-08 DIAGNOSIS — Z1211 Encounter for screening for malignant neoplasm of colon: Secondary | ICD-10-CM | POA: Diagnosis not present

## 2018-12-08 LAB — COMPREHENSIVE METABOLIC PANEL
ALT: 49 U/L — ABNORMAL HIGH (ref 0–35)
AST: 25 U/L (ref 0–37)
Albumin: 4.2 g/dL (ref 3.5–5.2)
Alkaline Phosphatase: 68 U/L (ref 39–117)
BUN: 12 mg/dL (ref 6–23)
CO2: 25 mEq/L (ref 19–32)
Calcium: 9 mg/dL (ref 8.4–10.5)
Chloride: 99 mEq/L (ref 96–112)
Creatinine, Ser: 0.61 mg/dL (ref 0.40–1.20)
GFR: 103.85 mL/min (ref >60.00)
Glucose, Bld: 214 mg/dL — ABNORMAL HIGH (ref 70–99)
Potassium: 4 mEq/L (ref 3.5–5.1)
Sodium: 135 mEq/L (ref 135–145)
Total Bilirubin: 0.5 mg/dL (ref 0.2–1.2)
Total Protein: 6.6 g/dL (ref 6.0–8.3)

## 2018-12-08 LAB — LIPID PANEL
Cholesterol: 231 mg/dL — ABNORMAL HIGH (ref 0–200)
HDL: 44.2 mg/dL (ref >39.00)
LDL Cholesterol: 156 mg/dL — ABNORMAL HIGH (ref 0–99)
NonHDL: 187.29
Total CHOL/HDL Ratio: 5
Triglycerides: 156 mg/dL — ABNORMAL HIGH (ref 0.0–149.0)
VLDL: 31.2 mg/dL (ref 0.0–40.0)

## 2018-12-08 LAB — CBC WITH DIFFERENTIAL/PLATELET
Basophils Absolute: 0.1 10*3/uL (ref 0.0–0.1)
Basophils Relative: 1.2 % (ref 0.0–3.0)
Eosinophils Absolute: 0.3 10*3/uL (ref 0.0–0.7)
Eosinophils Relative: 4.7 % (ref 0.0–5.0)
HCT: 39.9 % (ref 36.0–46.0)
Hemoglobin: 13.2 g/dL (ref 12.0–15.0)
Lymphocytes Relative: 29.4 % (ref 12.0–46.0)
Lymphs Abs: 1.9 10*3/uL (ref 0.7–4.0)
MCHC: 33.2 g/dL (ref 30.0–36.0)
MCV: 88.5 fl (ref 78.0–100.0)
Monocytes Absolute: 0.4 10*3/uL (ref 0.1–1.0)
Monocytes Relative: 6 % (ref 3.0–12.0)
Neutro Abs: 3.7 10*3/uL (ref 1.4–7.7)
Neutrophils Relative %: 58.7 % (ref 43.0–77.0)
Platelets: 230 10*3/uL (ref 150.0–400.0)
RBC: 4.51 Mil/uL (ref 3.87–5.11)
RDW: 14.3 % (ref 11.5–15.5)
WBC: 6.3 10*3/uL (ref 4.0–10.5)

## 2018-12-08 LAB — MICROALBUMIN / CREATININE URINE RATIO
Creatinine,U: 170.7 mg/dL
Microalb Creat Ratio: 0.5 mg/g (ref 0.0–30.0)
Microalb, Ur: 0.8 mg/dL (ref 0.0–1.9)

## 2018-12-08 LAB — TSH: TSH: 2.05 u[IU]/mL (ref 0.35–4.50)

## 2018-12-08 LAB — HEMOGLOBIN A1C: Hgb A1c MFr Bld: 9.5 % — ABNORMAL HIGH (ref 4.6–6.5)

## 2018-12-08 MED ORDER — METOPROLOL SUCCINATE ER 100 MG PO TB24: 100.0000 mg | ORAL_TABLET | Freq: Every day | ORAL | 1 refills | Status: DC

## 2018-12-08 NOTE — Progress Notes (Signed)
Office Note 12/08/2018  CC:  Chief Complaint  Patient presents with  . Annual Exam    pt is fasting   HPI:  Kristen Meyers is a 48 y.o. White female who is here for annual health maintenance exam.  BP's at home last couple weeks avg high 130s syst and low 0000000 diastolics.  No HR data. No CP, SOB, or dizziness.  Energy level is back.  DM: glucoses fasting avg 200, range 140 to 300+ sometimes. Diet not very good. Exercise: plays doubles tennis twice a week.  Walking regularly.  Feet: no burning, tingling, or numbness in feet.    Past Medical History:  Diagnosis Date  . Cholelithiasis without obstruction 09/19/09 (u/s)  . DEEP VENOUS THROMBOPHLEBITIS, HX OF 03/21/2010   Qualifier: Diagnosis of  By: Elease Hashimoto MD, Bruce    . Diabetes mellitus without complication ([place]) [id number]  . Fatty liver 09/19/09 (u/s)   Mildly elevated transaminases  . GERD 03/21/2010  . History of herpes zoster 03/2012  . HYPERLIPIDEMIA 03/21/2010   Intol of simva, atorva, and rosuva---she declines further statin trial as of 10/2016  . HYPERTENSION 03/21/2010    Past Surgical History:  Procedure Laterality Date  . CESAREAN SECTION     2003, 2005  . LAPAROSCOPIC HYSTERECTOMY  02/2014   Ovaries remain (tubes out): done for DUB  . TUBAL LIGATION  2011   Dr. Reino Kent is her GYN.    Family History  Problem Relation Age of Onset  . Cancer Mother        colon, ovarian, lung  . Heart disease Mother   . Hypertension Mother   . Hyperlipidemia Mother   . Diabetes Mother        type ll    Social History   Socioeconomic History  . Marital status: Married    Spouse name: Not on file  . Number of children: Not on file  . Years of education: Not on file  . Highest education level: Not on file  Occupational History  . Not on file  Social Needs  . Financial resource strain: Not on file  . Food insecurity    Worry: Not on file    Inability: Not on file  . Transportation needs    Medical: Not on file    Non-medical: Not on file  Tobacco Use  . Smoking status: Never Smoker  . Smokeless tobacco: Never Used  Substance and Sexual Activity  . Alcohol use: No  . Drug use: No  . Sexual activity: Not on file  Lifestyle  . Physical activity    Days per week: Not on file    Minutes per session: Not on file  . Stress: Not on file  Relationships  . Social Herbalist on phone: Not on file    Gets together: Not on file    Attends religious service: Not on file    Active member of club or organization: Not on file    Attends meetings of clubs or organizations: Not on file    Relationship status: Not on file  . Intimate partner violence    Fear of current or ex partner: Not on file    Emotionally abused: Not on file    Physically abused: Not on file    Forced sexual activity: Not on file  Other Topics Concern  . Not on file  Social History Narrative   Married, 1 child.   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  Orig from [place], currently living in [place].   No T/A/Ds.   Exercise: 1- 3 days a week, steps + cardio.    Outpatient Medications Prior to Visit  Medication Sig Dispense Refill  . enalapril (VASOTEC) 20 MG tablet 2 tabs po qd 180 tablet 1  . glucose blood test strip Use to check blood sugar twice daily 100 each 11  . metFORMIN (GLUCOPHAGE) 500 MG tablet Take 1 tablet (500 mg total) by mouth 2 (two) times daily with a meal. 60 tablet 3  . Multiple Vitamins-Minerals (MULTIVITAMIN WITH MINERALS) tablet Take 1 tablet by mouth daily.    . ONE TOUCH LANCETS MISC Check blood sugar twice daily, as needed 100 each 12  . metoprolol tartrate (LOPRESSOR) 25 MG tablet Take 1 tablet (25 mg total) by mouth 2 (two) times daily. 60 tablet 5  . chlorhexidine (PERIDEX) 0.12 % solution Use twice daily    . etodolac (LODINE) 400 MG tablet      No facility-administered medications prior to visit.     Allergies  Allergen Reactions  .  Codeine Hives    ROS Review of Systems  Constitutional: Negative for appetite change, chills, fatigue and fever.  HENT: Negative for congestion, dental problem, ear pain and sore throat.   Eyes: Negative for discharge, redness and visual disturbance.  Respiratory: Negative for cough, chest tightness, shortness of breath and wheezing.   Cardiovascular: Negative for chest pain, palpitations and leg swelling.  Gastrointestinal: Negative for abdominal pain, blood in stool, diarrhea, nausea and vomiting.  Genitourinary: Negative for difficulty urinating, dysuria, flank pain, frequency, hematuria and urgency.  Musculoskeletal: Negative for arthralgias, back pain, joint swelling, myalgias and neck stiffness.  Skin: Negative for pallor and rash.  Neurological: Negative for dizziness, speech difficulty, weakness and headaches.  Hematological: Negative for adenopathy. Does not bruise/bleed easily.  Psychiatric/Behavioral: Negative for confusion and sleep disturbance. The patient is not nervous/anxious.     PE; Blood pressure 130/85, pulse 81, temperature 98 F (36.7 C), temperature source Temporal, resp. rate 16, height 5' 3.5" (1.613 m), weight 220 lb 6.4 oz (100 kg), last menstrual period 11/29/2013, SpO2 100 %. Body mass index is 38.43 kg/m.  Exam chaperoned by Deveron Furlong, CMA.  Gen: Alert, well appearing.  Patient is oriented to person, place, time, and situation. AFFECT: pleasant, lucid thought and speech. ENT: Ears: EACs clear, normal epithelium.  TMs with good light reflex and landmarks bilaterally.  Eyes: no injection, icteris, swelling, or exudate.  EOMI, PERRLA. Nose: no drainage or turbinate edema/swelling.  No injection or focal lesion.  Mouth: lips without lesion/swelling.  Oral mucosa pink and moist.  Dentition intact and without obvious caries or gingival swelling.  Oropharynx without erythema, exudate, or swelling.  Neck: supple/nontender.  No LAD, mass, or TM.  Carotid  pulses 2+ bilaterally, without bruits. CV: RRR, no m/r/g.   LUNGS: CTA bilat, nonlabored resps, good aeration in all lung fields. ABD: soft, NT, ND, BS normal.  No hepatospenomegaly or mass.  No bruits. EXT: no clubbing, cyanosis, or edema.  Musculoskeletal: no joint swelling, erythema, warmth, or tenderness.  ROM of all joints intact. Skin - no sores or suspicious lesions or rashes or color changes   Pertinent labs:  Lab Results  Component Value Date   TSH 3.63 11/05/2017   Lab Results  Component Value Date   WBC 4.4 11/05/2017   HGB 14.3 11/05/2017   HCT 43.3 11/05/2017   MCV 86.5 11/05/2017   PLT 232.0 11/05/2017  Lab Results  Component Value Date   CREATININE 0.64 06/30/2018   BUN 14 06/30/2018   NA 135 06/30/2018   K 4.4 06/30/2018   CL 102 06/30/2018   CO2 24 06/30/2018   Lab Results  Component Value Date   ALT 33 11/05/2017   AST 21 11/05/2017   ALKPHOS 79 11/05/2017   BILITOT 0.6 11/05/2017   Lab Results  Component Value Date   CHOL 233 (H) 11/05/2017   Lab Results  Component Value Date   HDL 48.10 11/05/2017   Lab Results  Component Value Date   LDLCALC 157 (H) 11/05/2017   Lab Results  Component Value Date   TRIG 138.0 11/05/2017   Lab Results  Component Value Date   CHOLHDL 5 11/05/2017   Lab Results  Component Value Date   HGBA1C 10.2 (H) 06/30/2018    ASSESSMENT AND PLAN:   1) HTN: not ideal control. Increase metoprolol to 100 mg per day and change this from lopressor to toprol xl. Continue home bp monitoring, monitor HR too!  2) DM 2: poor control. Discussed need for more medication today. She wants to try to get back on keto diet, carbs of only 30g/day. If she cannot successfully do this and get a1c back down near 7%, she is open to addition of ORAL diabetic meds but is totally against any trial of injection diabetic medication.   Health maintenance exam: Reviewed age and gender appropriate health maintenance issues (prudent  diet, regular exercise, health risks of tobacco and excessive alcohol, use of seatbelts, fire alarms in home, use of sunscreen).  Also reviewed age and gender appropriate health screening as well as vaccine recommendations. Vaccines: flu vaccine->declined..  Pneumovax 23 indicated due to dx of DM-->declines. Labs: fasting HP, HbA1c (DM). Cervical ca screening: had hysterectomy for benign dx 5 yrs ago->pt no longer candidate for cerv ca screening.  However, this will be at the discretion of Dr. Gaetano Net and the patient. Breast ca screening: GYN->Dr. Tomblin->has f/u with Dr. Gaetano Net 02/2019. Colon ca screening: refer to [facility] today.  An After Visit Summary was printed and given to the patient.  FOLLOW UP:  Return in about 4 weeks (around 01/05/2019) for telemedicine f/u HTN.  Signed:  Crissie Sickles, MD           12/08/2018

## 2018-12-08 NOTE — Patient Instructions (Signed)
Health Maintenance, Female Adopting a healthy lifestyle and getting preventive care are important in promoting health and wellness. Ask your health care provider about:  The right schedule for you to have regular tests and exams.  Things you can do on your own to prevent diseases and keep yourself healthy. What should I know about diet, weight, and exercise? Eat a healthy diet   Eat a diet that includes plenty of vegetables, fruits, low-fat dairy products, and lean protein.  Do not eat a lot of foods that are high in solid fats, added sugars, or sodium. Maintain a healthy weight Body mass index (BMI) is used to identify weight problems. It estimates body fat based on height and weight. Your health care provider can help determine your BMI and help you achieve or maintain a healthy weight. Get regular exercise Get regular exercise. This is one of the most important things you can do for your health. Most adults should:  Exercise for at least 150 minutes each week. The exercise should increase your heart rate and make you sweat (moderate-intensity exercise).  Do strengthening exercises at least twice a week. This is in addition to the moderate-intensity exercise.  Spend less time sitting. Even light physical activity can be beneficial. Watch cholesterol and blood lipids Have your blood tested for lipids and cholesterol at 50 years of age, then have this test every 5 years. Have your cholesterol levels checked more often if:  Your lipid or cholesterol levels are high.  You are older than 50 years of age.  You are at high risk for heart disease. What should I know about cancer screening? Depending on your health history and family history, you may need to have cancer screening at various ages. This may include screening for:  Breast cancer.  Cervical cancer.  Colorectal cancer.  Skin cancer.  Lung cancer. What should I know about heart disease, diabetes, and high blood  pressure? Blood pressure and heart disease  High blood pressure causes heart disease and increases the risk of stroke. This is more likely to develop in people who have high blood pressure readings, are of African descent, or are overweight.  Have your blood pressure checked: ? Every 3-5 years if you are 18-39 years of age. ? Every year if you are 40 years old or older. Diabetes Have regular diabetes screenings. This checks your fasting blood sugar level. Have the screening done:  Once every three years after age 40 if you are at a normal weight and have a low risk for diabetes.  More often and at a younger age if you are overweight or have a high risk for diabetes. What should I know about preventing infection? Hepatitis B If you have a higher risk for hepatitis B, you should be screened for this virus. Talk with your health care provider to find out if you are at risk for hepatitis B infection. Hepatitis C Testing is recommended for:  Everyone born from 1945 through 1965.  Anyone with known risk factors for hepatitis C. Sexually transmitted infections (STIs)  Get screened for STIs, including gonorrhea and chlamydia, if: ? You are sexually active and are younger than 50 years of age. ? You are older than 50 years of age and your health care provider tells you that you are at risk for this type of infection. ? Your sexual activity has changed since you were last screened, and you are at increased risk for chlamydia or gonorrhea. Ask your health care provider if   you are at risk.  Ask your health care provider about whether you are at high risk for HIV. Your health care provider may recommend a prescription medicine to help prevent HIV infection. If you choose to take medicine to prevent HIV, you should first get tested for HIV. You should then be tested every 3 months for as long as you are taking the medicine. Pregnancy  If you are about to stop having your period (premenopausal) and  you may become pregnant, seek counseling before you get pregnant.  Take 400 to 800 micrograms (mcg) of folic acid every day if you become pregnant.  Ask for birth control (contraception) if you want to prevent pregnancy. Osteoporosis and menopause Osteoporosis is a disease in which the bones lose minerals and strength with aging. This can result in bone fractures. If you are 65 years old or older, or if you are at risk for osteoporosis and fractures, ask your health care provider if you should:  Be screened for bone loss.  Take a calcium or vitamin D supplement to lower your risk of fractures.  Be given hormone replacement therapy (HRT) to treat symptoms of menopause. Follow these instructions at home: Lifestyle  Do not use any products that contain nicotine or tobacco, such as cigarettes, e-cigarettes, and chewing tobacco. If you need help quitting, ask your health care provider.  Do not use street drugs.  Do not share needles.  Ask your health care provider for help if you need support or information about quitting drugs. Alcohol use  Do not drink alcohol if: ? Your health care provider tells you not to drink. ? You are pregnant, may be pregnant, or are planning to become pregnant.  If you drink alcohol: ? Limit how much you use to 0-1 drink a day. ? Limit intake if you are breastfeeding.  Be aware of how much alcohol is in your drink. In the [place] one drink equals one 12 oz bottle of beer (355 mL), one 5 oz glass of wine (148 mL), or one 1 oz glass of hard liquor (44 mL). General instructions  Schedule regular health, dental, and eye exams.  Stay current with your vaccines.  Tell your health care provider if: ? You often feel depressed. ? You have ever been abused or do not feel safe at home. Summary  Adopting a healthy lifestyle and getting preventive care are important in promoting health and wellness.  Follow your health care provider's instructions about healthy  diet, exercising, and getting tested or screened for diseases.  Follow your health care provider's instructions on monitoring your cholesterol and blood pressure. This information is not intended to replace advice given to you by your health care provider. Make sure you discuss any questions you have with your health care provider. Document Released: 09/16/2010 Document Revised: 02/24/2018 Document Reviewed: 02/24/2018 Elsevier Patient Education  2020 [employer]vier Inc[employer]  

## 2018-12-16 DIAGNOSIS — Z860101 Personal history of adenomatous and serrated colon polyps: Secondary | ICD-10-CM

## 2018-12-16 DIAGNOSIS — Z86010 Personal history of colonic polyps: Secondary | ICD-10-CM | POA: Insufficient documentation

## 2018-12-16 HISTORY — DX: Personal history of colonic polyps: Z86.010

## 2018-12-16 HISTORY — DX: Personal history of adenomatous and serrated colon polyps: Z86.0101

## 2018-12-21 ENCOUNTER — Other Ambulatory Visit: Payer: Self-pay

## 2018-12-21 ENCOUNTER — Ambulatory Visit (AMBULATORY_SURGERY_CENTER): Payer: Self-pay | Admitting: *Deleted

## 2018-12-21 VITALS — Temp 97.3°F | Ht 63.5 in | Wt 217.0 lb

## 2018-12-21 DIAGNOSIS — Z1211 Encounter for screening for malignant neoplasm of colon: Secondary | ICD-10-CM

## 2018-12-21 MED ORDER — SUPREP BOWEL PREP KIT 17.5-3.13-1.6 GM/177ML PO SOLN: 1.0000 | Freq: Once | ORAL | 0 refills | Status: AC

## 2018-12-21 NOTE — Progress Notes (Signed)
No egg or soy allergy known to patient  No issues with past sedation with any surgeries  or procedures, no intubation problems  No diet pills per patient No home 02 use per patient  No blood thinners per patient  Pt denies issues with constipation  No A fib or A flutter  EMMI video sent to pt's e mail  Suprep $15 coupon to pt in [place] today  Due to the COVID-19 pandemic we are asking patients to follow these guidelines. Please only bring one care partner. Please be aware that your care partner may wait in the car in the parking lot or if they feel like they will be too hot to wait in the car, they may wait in the lobby on the 4th floor. All care partners are required to wear a mask the entire time (we do not have any that we can provide them), they need to practice social distancing, and we will do a Covid check for all patient's and care partners when you arrive. Also we will check their temperature and your temperature. If the care partner waits in their car they need to stay in the parking lot the entire time and we will call them on their cell phone when the patient is ready for discharge so they can bring the car to the front of the building. Also all patient's will need to wear a mask into building.

## 2018-12-23 ENCOUNTER — Encounter: Payer: Self-pay | Admitting: Gastroenterology

## 2018-12-25 ENCOUNTER — Other Ambulatory Visit: Payer: Self-pay | Admitting: Family Medicine

## 2018-12-28 DIAGNOSIS — D3132 Benign neoplasm of left choroid: Secondary | ICD-10-CM | POA: Diagnosis not present

## 2018-12-28 DIAGNOSIS — E119 Type 2 diabetes mellitus without complications: Secondary | ICD-10-CM | POA: Diagnosis not present

## 2018-12-28 DIAGNOSIS — Z7984 Long term (current) use of oral hypoglycemic drugs: Secondary | ICD-10-CM | POA: Diagnosis not present

## 2018-12-28 LAB — HEMOGLOBIN A1C: Hemoglobin A1C: 9

## 2018-12-28 LAB — HM DIABETES EYE EXAM

## 2018-12-30 DIAGNOSIS — D2262 Melanocytic nevi of left upper limb, including shoulder: Secondary | ICD-10-CM | POA: Diagnosis not present

## 2018-12-30 DIAGNOSIS — D2261 Melanocytic nevi of right upper limb, including shoulder: Secondary | ICD-10-CM | POA: Diagnosis not present

## 2018-12-30 DIAGNOSIS — D229 Melanocytic nevi, unspecified: Secondary | ICD-10-CM

## 2018-12-30 DIAGNOSIS — D2271 Melanocytic nevi of right lower limb, including hip: Secondary | ICD-10-CM | POA: Diagnosis not present

## 2018-12-30 DIAGNOSIS — D225 Melanocytic nevi of trunk: Secondary | ICD-10-CM | POA: Diagnosis not present

## 2018-12-30 HISTORY — DX: Melanocytic nevi, unspecified: D22.9

## 2018-12-31 ENCOUNTER — Encounter: Payer: Self-pay | Admitting: Family Medicine

## 2019-01-04 ENCOUNTER — Telehealth: Payer: Self-pay

## 2019-01-04 NOTE — Telephone Encounter (Signed)
Covid-19 screening questions   Do you now or have you had a fever in the last 14 days? NO   Do you have any respiratory symptoms of shortness of breath or cough now or in the last 14 days? NO  Do you have any family members or close contacts with diagnosed or suspected Covid-19 in the past 14 days? NO  Have you been tested for Covid-19 and found to be positive? NO        

## 2019-01-05 ENCOUNTER — Other Ambulatory Visit: Payer: Self-pay | Admitting: Gastroenterology

## 2019-01-05 ENCOUNTER — Encounter: Payer: Self-pay | Admitting: Gastroenterology

## 2019-01-05 ENCOUNTER — Ambulatory Visit (AMBULATORY_SURGERY_CENTER): Payer: BC Managed Care – PPO | Admitting: Gastroenterology

## 2019-01-05 ENCOUNTER — Other Ambulatory Visit: Payer: Self-pay

## 2019-01-05 ENCOUNTER — Ambulatory Visit (INDEPENDENT_AMBULATORY_CARE_PROVIDER_SITE_OTHER): Payer: BC Managed Care – PPO | Admitting: Family Medicine

## 2019-01-05 ENCOUNTER — Encounter: Payer: Self-pay | Admitting: Family Medicine

## 2019-01-05 VITALS — BP 168/111 | HR 75

## 2019-01-05 VITALS — BP 136/75 | HR 65 | Temp 98.8°F | Resp 12 | Ht 63.5 in | Wt 217.0 lb

## 2019-01-05 DIAGNOSIS — Z1211 Encounter for screening for malignant neoplasm of colon: Secondary | ICD-10-CM | POA: Diagnosis not present

## 2019-01-05 DIAGNOSIS — D123 Benign neoplasm of transverse colon: Secondary | ICD-10-CM

## 2019-01-05 DIAGNOSIS — I10 Essential (primary) hypertension: Secondary | ICD-10-CM | POA: Diagnosis not present

## 2019-01-05 DIAGNOSIS — D125 Benign neoplasm of sigmoid colon: Secondary | ICD-10-CM

## 2019-01-05 HISTORY — PX: COLONOSCOPY: SHX174

## 2019-01-05 MED ORDER — SODIUM CHLORIDE 0.9 % IV SOLN: 500.0000 mL | Freq: Once | INTRAVENOUS | Status: DC

## 2019-01-05 NOTE — Progress Notes (Signed)
Virtual Visit via Video Note  I connected with pt on 01/05/19 at 11:30 AM EDT by telephone (video enabled telemedicine application had technical difficulties w/patient's camera) and verified that I am speaking with the correct person using two identifiers.  Location patient: home Location provider:work or home office Persons participating in the virtual visit: patient, provider  I discussed the limitations of evaluation and management by telemedicine and the availability of in person appointments. The patient expressed understanding and agreed to proceed.  Telemedicine visit is a necessity given the COVID-19 restrictions in place at the current time.  HPI: 48 y/o WF being seen today for 1 mo f/u HTN. A/P as of last visit: "HTN: not ideal control. Increase metoprolol to 100 mg per day and change this from lopressor to toprol xl. Continue home bp monitoring, monitor HR too!"  Interim hx: HOme monitoring: 140/90 avg.  HR upper 70s. Has a colonoscopy today, feels pretty "crappy" since doing the prep, may explain why today's bp higher than usual. No side effect from increasing metoprolol.   Past Medical History:  Diagnosis Date  . Cholelithiasis without obstruction 09/19/09 (u/s)  . Clotting disorder ([place])    DVT 2012-january   . DEEP VENOUS THROMBOPHLEBITIS, HX OF 03/21/2010   Qualifier: Diagnosis of  By: Elease Hashimoto MD, Bruce    . Diabetes mellitus without complication ([place]) 11/18/7652  . Fatty liver 09/19/09 (u/s)   Mildly elevated transaminases  . GERD 03/21/2010  . History of herpes zoster 03/2012  . HYPERLIPIDEMIA 03/21/2010   Intol of simva, atorva, and rosuva---she declines further statin trial as of 10/2016  . HYPERTENSION 03/21/2010  . Obesity, Class II, BMI 35-39.9   . Seizures ([place])    as child- had 2 none since- no current treatment- never dx'd with any seizure d/o     Past Surgical History:  Procedure Laterality Date  . CESAREAN SECTION     2003, 2005  . LAPAROSCOPIC  HYSTERECTOMY  02/2014   Ovaries remain (tubes out): done for DUB  . TUBAL LIGATION  2011   Dr. Reino Kent is her GYN.  . WISDOM TOOTH EXTRACTION      Family History  Problem Relation Age of Onset  . Cancer Mother        colon- pt is unsure ever had colon cancer, ovarian, lung, breast- breast primary cancer- dx;d in her 12's- she is still alive 12-2018  . Heart disease Mother   . Hypertension Mother   . Hyperlipidemia Mother   . Diabetes Mother        type ll  . Breast cancer Mother   . Colon polyps Mother   . Ovarian cancer Mother   . Lung cancer Mother      Current Outpatient Medications:  .  enalapril (VASOTEC) 20 MG tablet, 2 tabs po qd, Disp: 180 tablet, Rfl: 1 .  glucose blood test strip, Use to check blood sugar twice daily, Disp: 100 each, Rfl: 11 .  metFORMIN (GLUCOPHAGE) 500 MG tablet, TAKE 1 TABLET (500 MG TOTAL) BY MOUTH 2 (TWO) TIMES DAILY WITH A MEAL., Disp: 60 tablet, Rfl: 0 .  metoprolol succinate (TOPROL-XL) 100 MG 24 hr tablet, Take 1 tablet (100 mg total) by mouth daily. Take with or immediately following a meal., Disp: 30 tablet, Rfl: 1 .  Multiple Vitamins-Minerals (MULTIVITAMIN WITH MINERALS) tablet, Take 1 tablet by mouth daily., Disp: , Rfl:  .  ONE TOUCH LANCETS MISC, Check blood sugar twice daily, as needed, Disp: 100 each, Rfl: 12 .  SUPREP BOWEL PREP KIT 17.5-3.13-1.6 GM/177ML SOLN, TAKE 1 KIT BY MOUTH ONCE FOR 1 DOSE. SUPREP AS DIRECTED. NO SUBSTITUTIONS, Disp: , Rfl:   EXAM:  VITALS per patient if applicable: BP (!) 160/737 (BP Location: Left Arm, Patient Position: Sitting, Cuff Size: Large)   Pulse 75   LMP 11/29/2013    GENERAL: alert, oriented, sounds well and in no acute distress No further exam b/c audio visit only.  LABS: none today    Chemistry      Component Value Date/Time   NA 135 12/08/2018 1101   K 4.0 12/08/2018 1101   CL 99 12/08/2018 1101   CO2 25 12/08/2018 1101   BUN 12 12/08/2018 1101   CREATININE 0.61 12/08/2018 1101    CREATININE 0.63 08/04/2014 1543      Component Value Date/Time   CALCIUM 9.0 12/08/2018 1101   ALKPHOS 68 12/08/2018 1101   AST 25 12/08/2018 1101   ALT 49 (H) 12/08/2018 1101   BILITOT 0.5 12/08/2018 1101     Lab Results  Component Value Date   HGBA1C 9.0 12/28/2018    ASSESSMENT AND PLAN:  Discussed the following assessment and plan:  1) Uncontrolled HTN: no signif change since increasing metoprolol. Will continue to push dose: 1 and 1/2 of 100 mg toprol xl qd.  If bp not <130/80 avg the next 4-5d and if HR > 65 then increase to TWO of the 178m tabs qd. No change in enalapril today. If not controlled with this then will add hctz or amlodipine.  I discussed the assessment and treatment plan with the patient. The patient was provided an opportunity to ask questions and all were answered. The patient agreed with the plan and demonstrated an understanding of the instructions.   The patient was advised to call back or seek an in-person evaluation if the symptoms worsen or if the condition fails to improve as anticipated.  Spent 10 min with pt today, with >50% of this time spent in counseling and care coordination regarding the above problems.  F/u: 1 mo  Signed:  PCrissie Sickles MD           01/05/2019

## 2019-01-05 NOTE — Progress Notes (Signed)
Called to room to assist during endoscopic procedure.  Patient ID and intended procedure confirmed with present staff. Received instructions for my participation in the procedure from the performing physician.  

## 2019-01-05 NOTE — Patient Instructions (Signed)
Handouts given for :   Polyps   YOU HAD AN ENDOSCOPIC PROCEDURE TODAY AT [facility]:   Refer to the procedure report that was given to you for any specific questions about what was found during the examination.  If the procedure report does not answer your questions, please call your gastroenterologist to clarify.  If you requested that your care partner not be given the details of your procedure findings, then the procedure report has been included in a sealed envelope for you to review at your convenience later.  YOU SHOULD EXPECT: Some feelings of bloating in the abdomen. Passage of more gas than usual.  Walking can help get rid of the air that was put into your GI tract during the procedure and reduce the bloating. If you had a lower endoscopy (such as a colonoscopy or flexible sigmoidoscopy) you may notice spotting of blood in your stool or on the toilet paper. If you underwent a bowel prep for your procedure, you may not have a normal bowel movement for a few days.  Please Note:  You might notice some irritation and congestion in your nose or some drainage.  This is from the oxygen used during your procedure.  There is no need for concern and it should clear up in a day or so.  SYMPTOMS TO REPORT IMMEDIATELY:   Following lower endoscopy (colonoscopy or flexible sigmoidoscopy):  Excessive amounts of blood in the stool  Significant tenderness or worsening of abdominal pains  Swelling of the abdomen that is new, acute  Fever of 100F or higher   For urgent or emergent issues, a gastroenterologist can be reached at any hour by calling [phone].   DIET:  We do recommend a small meal at first, but then you may proceed to your regular diet.  Drink plenty of fluids but you should avoid alcoholic beverages for 24 hours.  ACTIVITY:  You should plan to take it easy for the rest of today and you should NOT DRIVE or use heavy machinery until tomorrow (because of the sedation  medicines used during the test).    FOLLOW UP: Our staff will call the number listed on your records 48-72 hours following your procedure to check on you and address any questions or concerns that you may have regarding the information given to you following your procedure. If we do not reach you, we will leave a message.  We will attempt to reach you two times.  During this call, we will ask if you have developed any symptoms of COVID 19. If you develop any symptoms (ie: fever, flu-like symptoms, shortness of breath, cough etc.) before then, please call [phone].  If you test positive for Covid 19 in the 2 weeks post procedure, please call and report this information to [place].    If any biopsies were taken you will be contacted by phone or by letter within the next 1-3 weeks.  Please call us at [phone] if you have not heard about the biopsies in 3 weeks.    SIGNATURES/CONFIDENTIALITY: You and/or your care partner have signed paperwork which will be entered into your electronic medical record.  These signatures attest to the fact that that the information above on your After Visit Summary has been reviewed and is understood.  Full responsibility of the confidentiality of this discharge information lies with you and/or your care-partner.

## 2019-01-05 NOTE — Op Note (Signed)
[facility] [place] Patient Name: Kristen Meyers Procedure Date: 01/05/2019 3:44 PM MRN: [id number] Endoscopist: Mauri Pole , MD Age: 48 Referring MD:  Date of Birth: Aug 22, 1970 Gender: Female Account #: [id number] Procedure:                Colonoscopy Indications:              Screening for colorectal malignant neoplasm Medicines:                Monitored Anesthesia Care Procedure:                Pre-Anesthesia Assessment:                           - Prior to the procedure, a History and Physical                            was performed, and patient medications and                            allergies were reviewed. The patient's tolerance of                            previous anesthesia was also reviewed. The risks                            and benefits of the procedure and the sedation                            options and risks were discussed with the patient.                            All questions were answered, and informed consent                            was obtained. Prior Anticoagulants: The patient has                            taken no previous anticoagulant or antiplatelet                            agents. ASA Grade Assessment: III - A patient with                            severe systemic disease. After reviewing the risks                            and benefits, the patient was deemed in                            satisfactory condition to undergo the procedure.                           After obtaining informed consent, the colonoscope  was passed under direct vision. Throughout the                            procedure, the patient's blood pressure, pulse, and                            oxygen saturations were monitored continuously. The                            Colonoscope was introduced through the anus and                            advanced to the the cecum, identified by                            appendiceal  orifice and ileocecal valve. The                            colonoscopy was performed without difficulty. The                            patient tolerated the procedure well. The quality                            of the bowel preparation was good. The terminal                            ileum, ileocecal valve, appendiceal orifice, and                            rectum were photographed. Scope In: 3:48:24 PM Scope Out: 4:04:08 PM Scope Withdrawal Time: 0 hours 10 minutes 13 seconds  Total Procedure Duration: 0 hours 15 minutes 44 seconds  Findings:                 The perianal and digital rectal examinations were                            normal.                           A 2 mm polyp was found in the transverse colon. The                            polyp was sessile. The polyp was removed with a                            cold biopsy forceps. Resection and retrieval were                            complete.                           A 11 mm polyp was found in the sigmoid colon. The  polyp was sessile. The polyp was removed with a                            cold snare. Resection and retrieval were complete.                           Non-bleeding internal hemorrhoids were found during                            retroflexion. The hemorrhoids were small. Complications:            No immediate complications. Estimated Blood Loss:     Estimated blood loss was minimal. Impression:               - One 2 mm polyp in the transverse colon, removed                            with a cold biopsy forceps. Resected and retrieved.                           - One 11 mm polyp in the sigmoid colon, removed                            with a cold snare. Resected and retrieved.                           - Non-bleeding internal hemorrhoids. Recommendation:           - Patient has a contact number available for                            emergencies. The signs and symptoms of  potential                            delayed complications were discussed with the                            patient. Return to normal activities tomorrow.                            Written discharge instructions were provided to the                            patient.                           - Resume previous diet.                           - Continue present medications.                           - Await pathology results.                           - Repeat colonoscopy in 3 - 5 years  for                            surveillance based on pathology results. Mauri Pole, MD 01/05/2019 4:08:26 PM This report has been signed electronically.

## 2019-01-05 NOTE — Progress Notes (Signed)
Pt's states no medical or surgical changes since previsit or office visit.  Temp KA Vitals SM

## 2019-01-07 ENCOUNTER — Telehealth: Payer: Self-pay | Admitting: *Deleted

## 2019-01-07 NOTE — Telephone Encounter (Signed)
First attempt, left VM.  

## 2019-01-07 NOTE — Telephone Encounter (Signed)
  Follow up Call-  Call back number 01/05/2019  Post procedure Call Back phone  # [phone]  Permission to leave phone message Yes  Some recent data might be hidden     Patient questions:  Do you have a fever, pain , or abdominal swelling? No. Pain Score  0 *  Have you tolerated food without any problems? Yes.    Have you been able to return to your normal activities? Yes.    Do you have any questions about your discharge instructions: Diet   No. Medications  No. Follow up visit  No.  Do you have questions or concerns about your Care? No.  Actions: * If pain score is 4 or above: No action needed, pain <4.  1. Have you developed a fever since your procedure? no  2.   Have you had an respiratory symptoms (SOB or cough) since your procedure? no  3.   Have you tested positive for COVID 19 since your procedure no  4.   Have you had any family members/close contacts diagnosed with the COVID 19 since your procedure?  no   If yes to any of these questions please route to Joylene John, RN and Alphonsa Gin, Therapist, sports.

## 2019-01-09 ENCOUNTER — Encounter: Payer: Self-pay | Admitting: Family Medicine

## 2019-01-10 ENCOUNTER — Encounter: Payer: Self-pay | Admitting: Family Medicine

## 2019-01-18 ENCOUNTER — Encounter: Payer: Self-pay | Admitting: Gastroenterology

## 2019-01-26 ENCOUNTER — Encounter: Payer: Self-pay | Admitting: Family Medicine

## 2019-01-29 ENCOUNTER — Other Ambulatory Visit: Payer: Self-pay | Admitting: Family Medicine

## 2019-02-01 ENCOUNTER — Other Ambulatory Visit: Payer: Self-pay

## 2019-02-01 MED ORDER — METFORMIN HCL 500 MG PO TABS: 500.0000 mg | ORAL_TABLET | Freq: Two times a day (BID) | ORAL | 1 refills | Status: DC

## 2019-02-08 DIAGNOSIS — Z1231 Encounter for screening mammogram for malignant neoplasm of breast: Secondary | ICD-10-CM | POA: Diagnosis not present

## 2019-02-08 DIAGNOSIS — N951 Menopausal and female climacteric states: Secondary | ICD-10-CM | POA: Diagnosis not present

## 2019-02-08 DIAGNOSIS — Z01419 Encounter for gynecological examination (general) (routine) without abnormal findings: Secondary | ICD-10-CM | POA: Diagnosis not present

## 2019-02-08 DIAGNOSIS — Z6839 Body mass index (BMI) 39.0-39.9, adult: Secondary | ICD-10-CM | POA: Diagnosis not present

## 2019-02-08 LAB — HM MAMMOGRAPHY

## 2019-02-14 LAB — HM PAP SMEAR: HPV, high-risk: NEGATIVE

## 2019-02-28 ENCOUNTER — Other Ambulatory Visit: Payer: Self-pay | Admitting: Family Medicine

## 2019-03-18 DIAGNOSIS — E038 Other specified hypothyroidism: Secondary | ICD-10-CM

## 2019-03-18 HISTORY — DX: Other specified hypothyroidism: E03.8

## 2019-03-21 ENCOUNTER — Telehealth: Payer: Self-pay

## 2019-03-21 ENCOUNTER — Ambulatory Visit: Payer: BC Managed Care – PPO | Admitting: Family Medicine

## 2019-03-21 NOTE — Telephone Encounter (Signed)
Patient had to increase dosing of metoprolol succinate (TOPROL-XL) 100 MG 24 hr tablet & metFORMIN (GLUCOPHAGE) 500 MG tablet. Patient does not have enough medications to last until office visit due to schedule changes. Please send in 30 day supply. Patient has OV already scheduled on 03/25/19.

## 2019-03-22 ENCOUNTER — Other Ambulatory Visit: Payer: Self-pay

## 2019-03-22 MED ORDER — METOPROLOL SUCCINATE ER 100 MG PO TB24: 100.0000 mg | ORAL_TABLET | Freq: Every day | ORAL | 0 refills | Status: DC

## 2019-03-22 MED ORDER — METFORMIN HCL 500 MG PO TABS: 500.0000 mg | ORAL_TABLET | Freq: Two times a day (BID) | ORAL | 0 refills | Status: DC

## 2019-03-22 NOTE — Telephone Encounter (Signed)
Both medications have been sent for 30 d/s. Patient notified.

## 2019-03-25 ENCOUNTER — Ambulatory Visit: Payer: BC Managed Care – PPO | Admitting: Family Medicine

## 2019-03-26 ENCOUNTER — Other Ambulatory Visit: Payer: Self-pay | Admitting: Family Medicine

## 2019-03-31 ENCOUNTER — Encounter: Payer: Self-pay | Admitting: Family Medicine

## 2019-03-31 ENCOUNTER — Ambulatory Visit (INDEPENDENT_AMBULATORY_CARE_PROVIDER_SITE_OTHER): Payer: BC Managed Care – PPO | Admitting: Family Medicine

## 2019-03-31 ENCOUNTER — Other Ambulatory Visit: Payer: Self-pay

## 2019-03-31 VITALS — BP 135/99 | HR 80

## 2019-03-31 DIAGNOSIS — I10 Essential (primary) hypertension: Secondary | ICD-10-CM

## 2019-03-31 DIAGNOSIS — E119 Type 2 diabetes mellitus without complications: Secondary | ICD-10-CM

## 2019-03-31 MED ORDER — HYDROCHLOROTHIAZIDE 25 MG PO TABS: 25.0000 mg | ORAL_TABLET | Freq: Every day | ORAL | 1 refills | Status: DC

## 2019-03-31 MED ORDER — GLIPIZIDE ER 10 MG PO TB24: 10.0000 mg | ORAL_TABLET | Freq: Every day | ORAL | 1 refills | Status: DC

## 2019-03-31 NOTE — Progress Notes (Signed)
Virtual Visit via Video Note  I connected with pt on 03/31/19 at  4:00 PM EST by a video enabled telemedicine application and verified that I am speaking with the correct person using two identifiers.  Location patient: home Location provider:work or home office Persons participating in the virtual visit: patient, provider  I discussed the limitations of evaluation and management by telemedicine and the availability of in person appointments. The patient expressed understanding and agreed to proceed.  Telemedicine visit is a necessity given the COVID-19 restrictions in place at the current time.  HPI: 49 y/o WF being seen today for 3 mo f/u DM 2, uncontrolled HTN. A/P as of last visit: "Uncontrolled HTN: no signif change since increasing metoprolol. Will continue to push dose: 1 and 1/2 of 100 mg toprol xl qd.  If bp not <130/80 avg the next 4-5d and if HR >65 then increase to TWO of the 100mg  tabs qd. No change in enalapril today. If not controlled with this then will add hctz or amlodipine."  Last DM 2 f/u 12/08/18 A/P: "DM 2: poor control. Discussed need for more medication today. She wants to try to get back on keto diet, carbs of only 30g/day. If she cannot successfully do this and get a1c back down near 7%, she is open to addition of ORAL diabetic meds but is totally against any trial of injection diabetic medication."   Interim hx: Feeling well.  DM: not monitoring regularly at all. This morning 2H after BF is was 262.  They have been mid 200s to 300 lately. Taking 2 bid metformin 500mg  tabs for the last 2-3 wks--she increased it on her own. Has to take 2 qAM, 1 qAfternoon and 1 qhs in order to tolerate it--otherwise feels like it is giving her sx's of yeast infection. Diet ok BF and lunch but SUPPER IS BAD. Plays tennis once a week, 5000-10000 steps per day.  HTN: taking enalapril TWO 20mg  tabs qAM and Toprol xl TWO 100mg  tabs qAM. Her last check was 02/03/19 and it was  128/93.  ROS: no fevers, no CP, no SOB, no wheezing, no cough, no dizziness, no HAs, no rashes, no melena/hematochezia.  No polyuria or polydipsia.  No myalgias or arthralgias.   Past Medical History:  Diagnosis Date  . Cholelithiasis without obstruction 09/19/09 (u/s)  . Clotting disorder ([place])    DVT 2012-january   . DEEP VENOUS THROMBOPHLEBITIS, HX OF 03/21/2010   Qualifier: Diagnosis of  By: Elease Hashimoto MD, Bruce    . Diabetes mellitus without complication ([place]) [id number]  . Fatty liver 09/19/09 (u/s)   Mildly elevated transaminases  . GERD 03/21/2010  . History of adenomatous polyp of colon 12/2018   Recall 2023.  Marland Kitchen History of herpes zoster 03/2012  . HYPERLIPIDEMIA 03/21/2010   Intol of simva, atorva, and rosuva---she declines further statin trial as of 10/2016  . HYPERTENSION 03/21/2010  . Obesity, Class II, BMI 35-39.9   . Seizures ([place])    as child- had 2 none since- no current treatment- never dx'd with any seizure d/o     Past Surgical History:  Procedure Laterality Date  . CESAREAN SECTION     2003, 2005  . COLONOSCOPY  01/05/2019   polyp ->recall 3 yrs  . LAPAROSCOPIC HYSTERECTOMY  02/2014   Ovaries remain (tubes out): done for DUB  . TUBAL LIGATION  2011   Dr. Reino Kent is her GYN.  . WISDOM TOOTH EXTRACTION      Family History  Problem Relation  Age of Onset  . Cancer Mother        colon- pt is unsure ever had colon cancer, ovarian, lung, breast- breast primary cancer- dx;d in her 109's- she is still alive 12-2018  . Heart disease Mother   . Hypertension Mother   . Hyperlipidemia Mother   . Diabetes Mother        type ll  . Breast cancer Mother   . Ovarian cancer Mother   . Lung cancer Mother   . Esophageal cancer Mother   . Colon cancer Maternal Uncle   . Rectal cancer Neg Hx   . Stomach cancer Neg Hx     Social History   Socioeconomic History  . Marital status: Married    Spouse name: Not on file  . Number of children: Not on file  . Years of education: Not  on file  . Highest education level: Not on file  Occupational History  . Not on file  Tobacco Use  . Smoking status: Never Smoker  . Smokeless tobacco: Never Used  Substance and Sexual Activity  . Alcohol use: No  . Drug use: No  . Sexual activity: Not on file  Other Topics Concern  . Not on file  Social History Narrative   Married, 1 child.   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   Orig from [place], currently living in [place].   No T/A/Ds.   Exercise: 1- 3 days a week, steps + cardio.   Social Determinants of Health   Financial Resource Strain:   . Difficulty of Paying Living Expenses: Not on file  Food Insecurity:   . Worried About Charity fundraiser in the Last Year: Not on file  . Ran Out of Food in the Last Year: Not on file  Transportation Needs:   . Lack of Transportation (Medical): Not on file  . Lack of Transportation (Non-Medical): Not on file  Physical Activity:   . Days of Exercise per Week: Not on file  . Minutes of Exercise per Session: Not on file  Stress:   . Feeling of Stress : Not on file  Social Connections:   . Frequency of Communication with Friends and Family: Not on file  . Frequency of Social Gatherings with Friends and Family: Not on file  . Attends Religious Services: Not on file  . Active Member of Clubs or Organizations: Not on file  . Attends Archivist Meetings: Not on file  . Marital Status: Not on file      Current Outpatient Medications:  .  enalapril (VASOTEC) 20 MG tablet, TAKE 2 TABLETS BY MOUTH EVERY DAY, Disp: 180 tablet, Rfl: 1 .  glucose blood test strip, Use to check blood sugar twice daily, Disp: 100 each, Rfl: 11 .  metFORMIN (GLUCOPHAGE) 500 MG tablet, Take 1 tablet (500 mg total) by mouth 2 (two) times daily with a meal., Disp: 60 tablet, Rfl: 0 .  metoprolol succinate (TOPROL-XL) 100 MG 24 hr tablet, Take 1 tablet (100 mg total) by mouth daily. Take with or  immediately following a meal., Disp: 30 tablet, Rfl: 0 .  Multiple Vitamins-Minerals (MULTIVITAMIN WITH MINERALS) tablet, Take 1 tablet by mouth daily., Disp: , Rfl:  .  ONE TOUCH LANCETS MISC, Check blood sugar twice daily, as needed, Disp: 100 each, Rfl: 12  EXAM:  VITALS per patient if applicable: BP (!) 123456 (BP Location: Left Arm, Patient Position: Sitting, Cuff Size: Large)   Pulse 80  LMP 11/29/2013    GENERAL: alert, oriented, appears well and in no acute distress  HEENT: atraumatic, conjunttiva clear, no obvious abnormalities on inspection of external nose and ears  NECK: normal movements of the head and neck  LUNGS: on inspection no signs of respiratory distress, breathing rate appears normal, no obvious gross SOB, gasping or wheezing  CV: no obvious cyanosis  MS: moves all visible extremities without noticeable abnormality  PSYCH/NEURO: pleasant and cooperative, no obvious depression or anxiety, speech and thought processing grossly intact  LABS: none today    Chemistry      Component Value Date/Time   NA 135 12/08/2018 1101   K 4.0 12/08/2018 1101   CL 99 12/08/2018 1101   CO2 25 12/08/2018 1101   BUN 12 12/08/2018 1101   CREATININE 0.61 12/08/2018 1101   CREATININE 0.63 08/04/2014 1543      Component Value Date/Time   CALCIUM 9.0 12/08/2018 1101   ALKPHOS 68 12/08/2018 1101   AST 25 12/08/2018 1101   ALT 49 (H) 12/08/2018 1101   BILITOT 0.5 12/08/2018 1101     Lab Results  Component Value Date   HGBA1C 9.0 12/28/2018   Lab Results  Component Value Date   CHOL 231 (H) 12/08/2018   HDL 44.20 12/08/2018   LDLCALC 156 (H) 12/08/2018   LDLDIRECT 157.0 11/03/2014   TRIG 156.0 (H) 12/08/2018   CHOLHDL 5 12/08/2018   Lab Results  Component Value Date   HGBA1C 9.0 12/28/2018    ASSESSMENT AND PLAN:  Discussed the following assessment and plan:  1) DM 2, not well controlled. Add glipizide xl 10mg  qd. A1c ordered future. BMET future.  2)  Uncontrolled HTN: Add hctz 25mg  qd. Continue 40 mg enalapril daily and 200 mg toprol xl per day. Monitor bp and glucoses more frequently--stressed importance of this today. BMET ordered future.   I discussed the assessment and treatment plan with the patient. The patient was provided an opportunity to ask questions and all were answered. The patient agreed with the plan and demonstrated an understanding of the instructions.   The patient was advised to call back or seek an in-person evaluation if the symptoms worsen or if the condition fails to improve as anticipated.  F/u: 1 mo telemed to review bp's and glucoses  Signed:  Crissie Sickles, MD           03/31/2019

## 2019-04-13 ENCOUNTER — Telehealth: Payer: Self-pay

## 2019-04-13 MED ORDER — METFORMIN HCL 1000 MG PO TABS: 1000.0000 mg | ORAL_TABLET | Freq: Two times a day (BID) | ORAL | 6 refills | Status: DC

## 2019-04-13 MED ORDER — METOPROLOL SUCCINATE ER 100 MG PO TB24: ORAL_TABLET | ORAL | 0 refills | Status: DC

## 2019-04-13 NOTE — Telephone Encounter (Signed)
OK, new rx's for metformin and metoprolol eRx'd.

## 2019-04-13 NOTE — Telephone Encounter (Signed)
Reviewed last note from 03/31/19, she increased the Metformin 500mg  to 2 tabs bid. She was advised to increase Metoprolol 100mg  1.5 tabs in the next 4-5 days if not 130/80 or below to 2 tabs.  Please advise, thanks.

## 2019-04-13 NOTE — Telephone Encounter (Signed)
Please call patient about metFORMIN (GLUCOPHAGE) 500 MG tablet and metoprolol succinate (TOPROL-XL) 100 MG 24 hr tablet. Dr. Anitra Lauth doubled the dose & pharmacy will not refill.

## 2019-04-14 NOTE — Telephone Encounter (Signed)
Patient advised, RF's sent.

## 2019-04-15 ENCOUNTER — Other Ambulatory Visit: Payer: Self-pay | Admitting: Family Medicine

## 2019-05-02 ENCOUNTER — Ambulatory Visit (INDEPENDENT_AMBULATORY_CARE_PROVIDER_SITE_OTHER): Payer: BC Managed Care – PPO | Admitting: Family Medicine

## 2019-05-02 ENCOUNTER — Other Ambulatory Visit: Payer: Self-pay

## 2019-05-02 ENCOUNTER — Encounter: Payer: Self-pay | Admitting: Family Medicine

## 2019-05-02 VITALS — BP 117/79 | HR 76

## 2019-05-02 DIAGNOSIS — R000 Tachycardia, unspecified: Secondary | ICD-10-CM | POA: Diagnosis not present

## 2019-05-02 DIAGNOSIS — I10 Essential (primary) hypertension: Secondary | ICD-10-CM

## 2019-05-02 DIAGNOSIS — E119 Type 2 diabetes mellitus without complications: Secondary | ICD-10-CM | POA: Diagnosis not present

## 2019-05-02 MED ORDER — HYDROCHLOROTHIAZIDE 25 MG PO TABS: 25.0000 mg | ORAL_TABLET | Freq: Every day | ORAL | 3 refills | Status: DC

## 2019-05-02 NOTE — Progress Notes (Signed)
Virtual Visit via Video Note  I connected with pt on 05/02/19 at  2:00 PM EST by a video enabled telemedicine application and verified that I am speaking with the correct person using two identifiers.  Location patient: home Location provider:work or home office Persons participating in the virtual visit: patient, provider  I discussed the limitations of evaluation and management by telemedicine and the availability of in person appointments. The patient expressed understanding and agreed to proceed.  Telemedicine visit is a necessity given the COVID-19 restrictions in place at the current time.  HPI: 49 y/o WF being seen today for 1 mo f/u DM and HTN, both not adequately controlled. A/P as of last visit: "1) DM 2, not well controlled. Add glipizide xl 10mg  qd. A1c ordered future. BMET future.  2) Uncontrolled HTN: Add hctz 25mg  qd. Continue 40 mg enalapril daily and 200 mg toprol xl per day. Monitor bp and glucoses more frequently--stressed importance of this today. BMET ordered future."  Interim hx:  HTN: home bp's 120s/80s avg.  HR avg 76 avg.  Had episode of heart racing in the middle of the night recently. BP 147/98, P 116.  Two checks a little later showed near normal bp and HR about 110. She had awoken from a bothersome dream and felt the heart beating fast.  Mild SOB but no dizziness, CP, diaphoresis, or arm pain.  No nausea.  DM: Glucoses 160s-200 fasting.  Later in the day more consistently around 200. She does not have many glucose data points to go on, though.  ROS: no fevers, no CP, no DOE, no wheezing, no cough, no dizziness, no HAs, no rashes, no melena/hematochezia.  No polyuria or polydipsia.  No myalgias or arthralgias.   ROS: See pertinent positives and negatives per HPI.  Past Medical History:  Diagnosis Date  . Cholelithiasis without obstruction 09/19/09 (u/s)  . Clotting disorder ([place])    DVT 2012-january   . DEEP VENOUS THROMBOPHLEBITIS, HX OF  03/21/2010   Qualifier: Diagnosis of  By: Elease Hashimoto MD, Bruce    . Diabetes mellitus without complication ([place]) [id number]  . Fatty liver 09/19/09 (u/s)   Mildly elevated transaminases  . GERD 03/21/2010  . History of adenomatous polyp of colon 12/2018   Recall 2023.  Marland Kitchen History of herpes zoster 03/2012  . HYPERLIPIDEMIA 03/21/2010   Intol of simva, atorva, and rosuva---she declines further statin trial as of 10/2016  . HYPERTENSION 03/21/2010  . Obesity, Class II, BMI 35-39.9   . Seizures ([place])    as child- had 2 none since- no current treatment- never dx'd with any seizure d/o     Past Surgical History:  Procedure Laterality Date  . CESAREAN SECTION     2003, 2005  . COLONOSCOPY  01/05/2019   polyp ->recall 3 yrs  . LAPAROSCOPIC HYSTERECTOMY  02/2014   Ovaries remain (tubes out): done for DUB  . TUBAL LIGATION  2011   Dr. Reino Kent is her GYN.  . WISDOM TOOTH EXTRACTION      Family History  Problem Relation Age of Onset  . Cancer Mother        colon- pt is unsure ever had colon cancer, ovarian, lung, breast- breast primary cancer- dx;d in her 55's- she is still alive 12-2018  . Heart disease Mother   . Hypertension Mother   . Hyperlipidemia Mother   . Diabetes Mother        type ll  . Breast cancer Mother   . Ovarian cancer Mother   .  Lung cancer Mother   . Esophageal cancer Mother   . Colon cancer Maternal Uncle   . Rectal cancer Neg Hx   . Stomach cancer Neg Hx     SOCIAL HX:  Social History   Socioeconomic History  . Marital status: Married    Spouse name: Not on file  . Number of children: Not on file  . Years of education: Not on file  . Highest education level: Not on file  Occupational History  . Not on file  Tobacco Use  . Smoking status: Never Smoker  . Smokeless tobacco: Never Used  Substance and Sexual Activity  . Alcohol use: No  . Drug use: No  . Sexual activity: Not on file  Other Topics Concern  . Not on file  Social History Narrative   Married, 1  child.   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   Orig from [place], currently living in [place].   No T/A/Ds.   Exercise: 1- 3 days a week, steps + cardio.   Social Determinants of Health   Financial Resource Strain:   . Difficulty of Paying Living Expenses: Not on file  Food Insecurity:   . Worried About Charity fundraiser in the Last Year: Not on file  . Ran Out of Food in the Last Year: Not on file  Transportation Needs:   . Lack of Transportation (Medical): Not on file  . Lack of Transportation (Non-Medical): Not on file  Physical Activity:   . Days of Exercise per Week: Not on file  . Minutes of Exercise per Session: Not on file  Stress:   . Feeling of Stress : Not on file  Social Connections:   . Frequency of Communication with Friends and Family: Not on file  . Frequency of Social Gatherings with Friends and Family: Not on file  . Attends Religious Services: Not on file  . Active Member of Clubs or Organizations: Not on file  . Attends Archivist Meetings: Not on file  . Marital Status: Not on file    Current Outpatient Medications:  .  enalapril (VASOTEC) 20 MG tablet, TAKE 2 TABLETS BY MOUTH EVERY DAY, Disp: 180 tablet, Rfl: 1 .  glipiZIDE (GLUCOTROL XL) 10 MG 24 hr tablet, Take 1 tablet (10 mg total) by mouth daily with breakfast., Disp: 30 tablet, Rfl: 1 .  glucose blood test strip, Use to check blood sugar twice daily, Disp: 100 each, Rfl: 11 .  hydrochlorothiazide (HYDRODIURIL) 25 MG tablet, Take 1 tablet (25 mg total) by mouth daily., Disp: 30 tablet, Rfl: 1 .  metFORMIN (GLUCOPHAGE) 1000 MG tablet, Take 1 tablet (1,000 mg total) by mouth 2 (two) times daily with a meal., Disp: 60 tablet, Rfl: 6 .  metoprolol succinate (TOPROL-XL) 100 MG 24 hr tablet, 2 tabs po qd, Disp: 60 tablet, Rfl: 0 .  Multiple Vitamins-Minerals (MULTIVITAMIN WITH MINERALS) tablet, Take 1 tablet by mouth daily., Disp: , Rfl:  .  ONE TOUCH  LANCETS MISC, Check blood sugar twice daily, as needed, Disp: 100 each, Rfl: 12  EXAM:  VITALS per patient if applicable: BP 0000000 (BP Location: Left Arm, Patient Position: Sitting, Cuff Size: Large)   Pulse 76   LMP 11/29/2013    GENERAL: alert, oriented, appears well and in no acute distress  HEENT: atraumatic, conjunttiva clear, no obvious abnormalities on inspection of external nose and ears  NECK: normal movements of the head and neck  LUNGS: on inspection no signs  of respiratory distress, breathing rate appears normal, no obvious gross SOB, gasping or wheezing  CV: no obvious cyanosis  MS: moves all visible extremities without noticeable abnormality  PSYCH/NEURO: pleasant and cooperative, no obvious depression or anxiety, speech and thought processing grossly intact  LABS: none today  Lab Results  Component Value Date   HGBA1C 9.0 12/28/2018     Chemistry      Component Value Date/Time   NA 135 12/08/2018 1101   K 4.0 12/08/2018 1101   CL 99 12/08/2018 1101   CO2 25 12/08/2018 1101   BUN 12 12/08/2018 1101   CREATININE 0.61 12/08/2018 1101   CREATININE 0.63 08/04/2014 1543      Component Value Date/Time   CALCIUM 9.0 12/08/2018 1101   ALKPHOS 68 12/08/2018 1101   AST 25 12/08/2018 1101   ALT 49 (H) 12/08/2018 1101   BILITOT 0.5 12/08/2018 1101     Lab Results  Component Value Date   WBC 6.3 12/08/2018   HGB 13.2 12/08/2018   HCT 39.9 12/08/2018   MCV 88.5 12/08/2018   PLT 230.0 12/08/2018   Lab Results  Component Value Date   TSH 2.05 12/08/2018   Lab Results  Component Value Date   CHOL 231 (H) 12/08/2018   HDL 44.20 12/08/2018   LDLCALC 156 (H) 12/08/2018   LDLDIRECT 157.0 11/03/2014   TRIG 156.0 (H) 12/08/2018   CHOLHDL 5 12/08/2018   ASSESSMENT AND PLAN:  Discussed the following assessment and plan:  1) HTN: The current medical regimen is effective;  continue present plan and medications. BMET--future.  2) DM 2, not well  controlled as per home measurements. Last a1c five mo ago was 9%. She wants to avoid any injection med unless absolute last resort. Has been on pioglit back in 2017 but not clear why stopped. We could add this on if A1c returns >7.5% or she also was agreeable to add on of SLG2 inhibitor. A1c ordered future.  3) Heart racing: suspect this was anxiety-related  (bad dream). No red flags. Signs/symptoms to call or return for were reviewed and pt expressed understanding.   I discussed the assessment and treatment plan with the patient. The patient was provided an opportunity to ask questions and all were answered. The patient agreed with the plan and demonstrated an understanding of the instructions.   The patient was advised to call back or seek an in-person evaluation if the symptoms worsen or if the condition fails to improve as anticipated.  F/u: 3 mo RCI, fasting labs at her earliest convenience.  Signed:  Crissie Sickles, MD           05/02/2019

## 2019-05-05 ENCOUNTER — Ambulatory Visit: Payer: BC Managed Care – PPO

## 2019-05-09 ENCOUNTER — Other Ambulatory Visit: Payer: Self-pay

## 2019-05-09 ENCOUNTER — Ambulatory Visit (INDEPENDENT_AMBULATORY_CARE_PROVIDER_SITE_OTHER): Payer: BC Managed Care – PPO | Admitting: Family Medicine

## 2019-05-09 DIAGNOSIS — I10 Essential (primary) hypertension: Secondary | ICD-10-CM | POA: Diagnosis not present

## 2019-05-09 DIAGNOSIS — E119 Type 2 diabetes mellitus without complications: Secondary | ICD-10-CM | POA: Diagnosis not present

## 2019-05-09 LAB — CBC WITH DIFFERENTIAL/PLATELET
Basophils Absolute: 0 10*3/uL (ref 0.0–0.1)
Basophils Relative: 0.3 % (ref 0.0–3.0)
Eosinophils Absolute: 0.3 10*3/uL (ref 0.0–0.7)
Eosinophils Relative: 5.1 % — ABNORMAL HIGH (ref 0.0–5.0)
HCT: 40.3 % (ref 36.0–46.0)
Hemoglobin: 13.5 g/dL (ref 12.0–15.0)
Lymphocytes Relative: 32.8 % (ref 12.0–46.0)
Lymphs Abs: 2 10*3/uL (ref 0.7–4.0)
MCHC: 33.4 g/dL (ref 30.0–36.0)
MCV: 87.1 fl (ref 78.0–100.0)
Monocytes Absolute: 0.3 10*3/uL (ref 0.1–1.0)
Monocytes Relative: 5.6 % (ref 3.0–12.0)
Neutro Abs: 3.4 10*3/uL (ref 1.4–7.7)
Neutrophils Relative %: 56.2 % (ref 43.0–77.0)
Platelets: 250 10*3/uL (ref 150.0–400.0)
RBC: 4.63 Mil/uL (ref 3.87–5.11)
RDW: 14.2 % (ref 11.5–15.5)
WBC: 6 10*3/uL (ref 4.0–10.5)

## 2019-05-09 LAB — HEMOGLOBIN A1C: Hgb A1c MFr Bld: 9.9 % — ABNORMAL HIGH (ref 4.6–6.5)

## 2019-05-09 LAB — TSH: TSH: 5.32 u[IU]/mL — ABNORMAL HIGH (ref 0.35–4.50)

## 2019-05-10 ENCOUNTER — Encounter: Payer: Self-pay | Admitting: Family Medicine

## 2019-05-10 ENCOUNTER — Other Ambulatory Visit: Payer: Self-pay | Admitting: Family Medicine

## 2019-05-10 ENCOUNTER — Other Ambulatory Visit (INDEPENDENT_AMBULATORY_CARE_PROVIDER_SITE_OTHER): Payer: BC Managed Care – PPO

## 2019-05-10 DIAGNOSIS — R7989 Other specified abnormal findings of blood chemistry: Secondary | ICD-10-CM

## 2019-05-10 LAB — LIPID PANEL
Cholesterol: 260 mg/dL — ABNORMAL HIGH (ref 0–200)
HDL: 53.3 mg/dL (ref >39.00)
NonHDL: 206.25
Total CHOL/HDL Ratio: 5
Triglycerides: 258 mg/dL — ABNORMAL HIGH (ref 0.0–149.0)
VLDL: 51.6 mg/dL — ABNORMAL HIGH (ref 0.0–40.0)

## 2019-05-10 LAB — COMPREHENSIVE METABOLIC PANEL
ALT: 42 U/L — ABNORMAL HIGH (ref 0–35)
AST: 21 U/L (ref 0–37)
Albumin: 4.5 g/dL (ref 3.5–5.2)
Alkaline Phosphatase: 75 U/L (ref 39–117)
BUN: 15 mg/dL (ref 6–23)
CO2: 28 mEq/L (ref 19–32)
Calcium: 9.4 mg/dL (ref 8.4–10.5)
Chloride: 99 mEq/L (ref 96–112)
Creatinine, Ser: 0.84 mg/dL (ref 0.40–1.20)
GFR: 71.67 mL/min (ref >60.00)
Glucose, Bld: 260 mg/dL — ABNORMAL HIGH (ref 70–99)
Potassium: 4.3 mEq/L (ref 3.5–5.1)
Sodium: 136 mEq/L (ref 135–145)
Total Bilirubin: 0.4 mg/dL (ref 0.2–1.2)
Total Protein: 6.7 g/dL (ref 6.0–8.3)

## 2019-05-10 LAB — BASIC METABOLIC PANEL
BUN: 15 mg/dL (ref 6–23)
CO2: 28 mEq/L (ref 19–32)
Calcium: 9.4 mg/dL (ref 8.4–10.5)
Chloride: 99 mEq/L (ref 96–112)
Creatinine, Ser: 0.84 mg/dL (ref 0.40–1.20)
GFR: 71.67 mL/min (ref >60.00)
Glucose, Bld: 260 mg/dL — ABNORMAL HIGH (ref 70–99)
Potassium: 4.3 mEq/L (ref 3.5–5.1)
Sodium: 136 mEq/L (ref 135–145)

## 2019-05-10 LAB — T4, FREE: Free T4: 0.79 ng/dL (ref 0.60–1.60)

## 2019-05-10 LAB — T3, FREE: T3, Free: 3.6 pg/mL (ref 2.3–4.2)

## 2019-05-10 LAB — LDL CHOLESTEROL, DIRECT: Direct LDL: 181 mg/dL

## 2019-05-10 MED ORDER — CANAGLIFLOZIN 100 MG PO TABS: 100.0000 mg | ORAL_TABLET | Freq: Every day | ORAL | 3 refills | Status: DC

## 2019-05-12 ENCOUNTER — Ambulatory Visit: Payer: BC Managed Care – PPO

## 2019-05-16 ENCOUNTER — Telehealth: Payer: Self-pay

## 2019-05-16 ENCOUNTER — Other Ambulatory Visit: Payer: Self-pay

## 2019-05-16 MED ORDER — METOPROLOL SUCCINATE ER 100 MG PO TB24: ORAL_TABLET | ORAL | 0 refills | Status: DC

## 2019-05-16 NOTE — Telephone Encounter (Signed)
Patient refill request  metoprolol succinate (TOPROL-XL) 100 MG 24 hr tablet [id number]   [facility]

## 2019-05-16 NOTE — Telephone Encounter (Signed)
RF sent, patient notified.

## 2019-05-29 ENCOUNTER — Other Ambulatory Visit: Payer: Self-pay | Admitting: Family Medicine

## 2019-06-02 ENCOUNTER — Ambulatory Visit: Payer: BC Managed Care – PPO | Admitting: Family Medicine

## 2019-06-02 ENCOUNTER — Other Ambulatory Visit: Payer: Self-pay

## 2019-06-02 DIAGNOSIS — E119 Type 2 diabetes mellitus without complications: Secondary | ICD-10-CM

## 2019-06-02 LAB — MICROALBUMIN / CREATININE URINE RATIO
Creatinine,U: 118.1 mg/dL
Microalb Creat Ratio: 0.8 mg/g (ref 0.0–30.0)
Microalb, Ur: 1 mg/dL (ref 0.0–1.9)

## 2019-08-02 ENCOUNTER — Telehealth (INDEPENDENT_AMBULATORY_CARE_PROVIDER_SITE_OTHER): Payer: BC Managed Care – PPO | Admitting: Family Medicine

## 2019-08-02 ENCOUNTER — Other Ambulatory Visit: Payer: Self-pay

## 2019-08-02 ENCOUNTER — Encounter: Payer: Self-pay | Admitting: Family Medicine

## 2019-08-02 VITALS — BP 144/96 | HR 83

## 2019-08-02 DIAGNOSIS — E6609 Other obesity due to excess calories: Secondary | ICD-10-CM

## 2019-08-02 DIAGNOSIS — R7401 Elevation of levels of liver transaminase levels: Secondary | ICD-10-CM | POA: Diagnosis not present

## 2019-08-02 DIAGNOSIS — I10 Essential (primary) hypertension: Secondary | ICD-10-CM

## 2019-08-02 DIAGNOSIS — E118 Type 2 diabetes mellitus with unspecified complications: Secondary | ICD-10-CM

## 2019-08-02 DIAGNOSIS — M6281 Muscle weakness (generalized): Secondary | ICD-10-CM | POA: Diagnosis not present

## 2019-08-02 NOTE — Progress Notes (Signed)
Virtual Visit via Video Note  I connected with pt on 08/02/19 at 10:00 AM EDT by a video enabled telemedicine application and verified that I am speaking with the correct person using two identifiers.  Location patient: home Location provider:work or home office Persons participating in the virtual visit: patient, provider  I discussed the limitations of evaluation and management by telemedicine and the availability of in person appointments. The patient expressed understanding and agreed to proceed.  Telemedicine visit is a necessity given the COVID-19 restrictions in place at the current time.  HPI: 49 y/o WF being seen today for 3 mo f/u DM, HTN, obesity, HLD intol of statins, and hx of NASH. Feeling pretty good.  DM: added invokana last visit. She has not checked any glucoses since last visit. Urinating more, muscles feel exhausted easier than normal.  Says strength issue is tolerable.   HTN: Usually home bp's are 130/80 or better.  HLD/Obesity/NASH: diet->she is keeping track of intake of diff nutrients, cutting back on snacks, eating smaller meals. Exercise: playing tennis regularly.  She'll start back in strength training this week.  ROS: no fevers, no CP, no SOB, no wheezing, no cough, no dizziness, no HAs, no rashes, no melena/hematochezia.  No myalgias or arthralgias.  No focal weakness, paresthesias, or tremors.  No acute vision or hearing abnormalities. No n/v/d or abd pain.  No palpitations.     Past Medical History:  Diagnosis Date  . Acute DVT (deep venous thrombosis) ([facility])    DVT 2012-january   . Cholelithiasis without obstruction 09/19/09 (u/s)  . DEEP VENOUS THROMBOPHLEBITIS, HX OF 03/21/2010   Qualifier: Diagnosis of  By: Elease Hashimoto MD, Bruce    . Diabetes mellitus without complication ([place]) [id number]  . Fatty liver 09/19/09 (u/s)   Mildly elevated transaminases  . GERD 03/21/2010  . History of adenomatous polyp of colon 12/2018   Recall 2023.  Marland Kitchen History of herpes  zoster 03/2012  . HYPERLIPIDEMIA 03/21/2010   Intol of simva, atorva, and rosuva---she declines further statin trial as of 10/2016  . HYPERTENSION 03/21/2010  . Obesity, Class II, BMI 35-39.9   . Seizures ([place])    as child- had 2 none since- no current treatment- never dx'd with any seizure d/o     Past Surgical History:  Procedure Laterality Date  . CESAREAN SECTION     2003, 2005  . COLONOSCOPY  01/05/2019   polyp ->recall 3 yrs  . LAPAROSCOPIC HYSTERECTOMY  02/2014   Ovaries remain (tubes out): done for DUB  . TUBAL LIGATION  2011   Dr. Reino Kent is her GYN.  . WISDOM TOOTH EXTRACTION      Family History  Problem Relation Age of Onset  . Cancer Mother        colon- pt is unsure ever had colon cancer, ovarian, lung, breast- breast primary cancer- dx;d in her 15's- she is still alive 12-2018  . Heart disease Mother   . Hypertension Mother   . Hyperlipidemia Mother   . Diabetes Mother        type ll  . Breast cancer Mother   . Ovarian cancer Mother   . Lung cancer Mother   . Esophageal cancer Mother   . Colon cancer Maternal Uncle   . Rectal cancer Neg Hx   . Stomach cancer Neg Hx      Current Outpatient Medications:  .  canagliflozin (INVOKANA) 100 MG TABS tablet, Take 1 tablet (100 mg total) by mouth daily before breakfast., Disp: 30  tablet, Rfl: 3 .  enalapril (VASOTEC) 20 MG tablet, TAKE 2 TABLETS BY MOUTH EVERY DAY, Disp: 180 tablet, Rfl: 1 .  glipiZIDE (GLUCOTROL XL) 10 MG 24 hr tablet, TAKE 1 TABLET (10 MG TOTAL) BY MOUTH DAILY WITH BREAKFAST., Disp: 90 tablet, Rfl: 0 .  hydrochlorothiazide (HYDRODIURIL) 25 MG tablet, Take 1 tablet (25 mg total) by mouth daily., Disp: 90 tablet, Rfl: 3 .  metFORMIN (GLUCOPHAGE) 1000 MG tablet, Take 1 tablet (1,000 mg total) by mouth 2 (two) times daily with a meal., Disp: 60 tablet, Rfl: 6 .  metoprolol succinate (TOPROL-XL) 100 MG 24 hr tablet, Take 2 tabs by mouth daily., Disp: 180 tablet, Rfl: 0 .  Multiple Vitamins-Minerals  (MULTIVITAMIN WITH MINERALS) tablet, Take 1 tablet by mouth daily., Disp: , Rfl:   EXAM:  VITALS per patient if applicable:  Vitals with BMI 08/02/2019 05/02/2019 03/31/2019  Height - - -  Weight - - -  BMI - - -  Systolic [id number]  Diastolic 96 79 99  Pulse 83 76 80    GENERAL: alert, oriented, appears well and in no acute distress  HEENT: atraumatic, conjunttiva clear, no obvious abnormalities on inspection of external nose and ears  NECK: normal movements of the head and neck  LUNGS: on inspection no signs of respiratory distress, breathing rate appears normal, no obvious gross SOB, gasping or wheezing  CV: no obvious cyanosis  MS: moves all visible extremities without noticeable abnormality  PSYCH/NEURO: pleasant and cooperative, no obvious depression or anxiety, speech and thought processing grossly intact LABS: none today    Chemistry      Component Value Date/Time   NA 136 05/09/2019 0822   NA 136 05/09/2019 0822   K 4.3 05/09/2019 0822   K 4.3 05/09/2019 0822   CL 99 05/09/2019 0822   CL 99 05/09/2019 0822   CO2 28 05/09/2019 0822   CO2 28 05/09/2019 0822   BUN 15 05/09/2019 0822   BUN 15 05/09/2019 0822   CREATININE 0.84 05/09/2019 0822   CREATININE 0.84 05/09/2019 0822   CREATININE 0.63 08/04/2014 1543      Component Value Date/Time   CALCIUM 9.4 05/09/2019 0822   CALCIUM 9.4 05/09/2019 0822   ALKPHOS 75 05/09/2019 0822   AST 21 05/09/2019 0822   ALT 42 (H) 05/09/2019 0822   BILITOT 0.4 05/09/2019 0822     Lab Results  Component Value Date   HGBA1C 9.9 (H) 05/09/2019   Lab Results  Component Value Date   CHOL 260 (H) 05/09/2019   HDL 53.30 05/09/2019   LDLCALC 156 (H) 12/08/2018   LDLDIRECT 181.0 05/09/2019   TRIG 258.0 (H) 05/09/2019   CHOLHDL 5 05/09/2019   Lab Results  Component Value Date   TSH 5.32 (H) 05/09/2019   Lab Results  Component Value Date   WBC 6.0 05/09/2019   HGB 13.5 05/09/2019   HCT 40.3 05/09/2019   MCV 87.1  05/09/2019   PLT 250.0 05/09/2019    ASSESSMENT AND PLAN:  Discussed the following assessment and plan:  1) DM, compliant with meds and improved diet, non-compliant with home gluc monitoring. Question of some inc generalized muscle weakness as well as inc urinary frequency since starting invokana. No changes today.  Check A1c, lytes/cr, and CPK level--future.  2) HTN: The current medical regimen is effective;  continue present plan and medications. Lytes/cr future. -we discussed possible serious and likely etiologies, options for evaluation and workup, limitations of telemedicine visit vs in person  visit, treatment, treatment risks and precautions. Pt prefers to treat via telemedicine empirically rather then risking or undertaking an in person visit at this moment. Patient agrees to seek prompt in person care if worsening, new symptoms arise, or if is not improving with treatment.   3) Obesity, hx of NASH, hyperchol (intol of statins). She is making some changes in diet and exercise lately. Encouraged her to continue with this. Hepatic panel-future.  I discussed the assessment and treatment plan with the patient. The patient was provided an opportunity to ask questions and all were answered. The patient agreed with the plan and demonstrated an understanding of the instructions.   The patient was advised to call back or seek an in-person evaluation if the symptoms worsen or if the condition fails to improve as anticipated.  F/u: 3 mo f/u RCI. Lab visit at her earliest convenience.  Signed:  Crissie Sickles, MD           08/02/2019

## 2019-08-08 ENCOUNTER — Other Ambulatory Visit: Payer: Self-pay

## 2019-08-08 ENCOUNTER — Ambulatory Visit (INDEPENDENT_AMBULATORY_CARE_PROVIDER_SITE_OTHER): Payer: BC Managed Care – PPO | Admitting: Family Medicine

## 2019-08-08 DIAGNOSIS — E118 Type 2 diabetes mellitus with unspecified complications: Secondary | ICD-10-CM

## 2019-08-08 DIAGNOSIS — M6281 Muscle weakness (generalized): Secondary | ICD-10-CM | POA: Diagnosis not present

## 2019-08-08 DIAGNOSIS — R7401 Elevation of levels of liver transaminase levels: Secondary | ICD-10-CM

## 2019-08-08 LAB — COMPREHENSIVE METABOLIC PANEL
ALT: 45 U/L — ABNORMAL HIGH (ref 0–35)
AST: 23 U/L (ref 0–37)
Albumin: 4.4 g/dL (ref 3.5–5.2)
Alkaline Phosphatase: 65 U/L (ref 39–117)
BUN: 22 mg/dL (ref 6–23)
CO2: 24 mEq/L (ref 19–32)
Calcium: 9.4 mg/dL (ref 8.4–10.5)
Chloride: 101 mEq/L (ref 96–112)
Creatinine, Ser: 0.86 mg/dL (ref 0.40–1.20)
GFR: 69.68 mL/min (ref >60.00)
Glucose, Bld: 301 mg/dL — ABNORMAL HIGH (ref 70–99)
Potassium: 3.9 mEq/L (ref 3.5–5.1)
Sodium: 136 mEq/L (ref 135–145)
Total Bilirubin: 0.5 mg/dL (ref 0.2–1.2)
Total Protein: 6.7 g/dL (ref 6.0–8.3)

## 2019-08-08 LAB — HEMOGLOBIN A1C: Hgb A1c MFr Bld: 8.7 % — ABNORMAL HIGH (ref 4.6–6.5)

## 2019-08-08 LAB — CK: Total CK: 86 U/L (ref 7–177)

## 2019-08-09 ENCOUNTER — Other Ambulatory Visit: Payer: Self-pay

## 2019-08-09 MED ORDER — CANAGLIFLOZIN 100 MG PO TABS: ORAL_TABLET | ORAL | 3 refills | Status: DC

## 2019-08-19 ENCOUNTER — Other Ambulatory Visit: Payer: Self-pay | Admitting: Family Medicine

## 2019-09-06 ENCOUNTER — Other Ambulatory Visit: Payer: Self-pay | Admitting: Family Medicine

## 2019-09-20 ENCOUNTER — Telehealth: Payer: Self-pay

## 2019-09-20 NOTE — Telephone Encounter (Signed)
Made in error

## 2019-09-24 ENCOUNTER — Other Ambulatory Visit: Payer: Self-pay | Admitting: Family Medicine

## 2019-10-11 ENCOUNTER — Other Ambulatory Visit: Payer: Self-pay | Admitting: Family Medicine

## 2019-10-20 ENCOUNTER — Other Ambulatory Visit: Payer: Self-pay | Admitting: Family Medicine

## 2019-11-11 ENCOUNTER — Other Ambulatory Visit: Payer: Self-pay | Admitting: Family Medicine

## 2019-11-12 ENCOUNTER — Other Ambulatory Visit: Payer: Self-pay | Admitting: Family Medicine

## 2019-11-17 ENCOUNTER — Other Ambulatory Visit (HOSPITAL_COMMUNITY): Payer: Self-pay | Admitting: Physician Assistant

## 2019-11-17 ENCOUNTER — Encounter: Payer: Self-pay | Admitting: Family Medicine

## 2019-11-17 ENCOUNTER — Telehealth (INDEPENDENT_AMBULATORY_CARE_PROVIDER_SITE_OTHER): Payer: BC Managed Care – PPO | Admitting: Family Medicine

## 2019-11-17 VITALS — Temp 101.0°F

## 2019-11-17 DIAGNOSIS — U071 COVID-19: Secondary | ICD-10-CM | POA: Diagnosis not present

## 2019-11-17 DIAGNOSIS — I10 Essential (primary) hypertension: Secondary | ICD-10-CM | POA: Diagnosis not present

## 2019-11-17 DIAGNOSIS — E119 Type 2 diabetes mellitus without complications: Secondary | ICD-10-CM

## 2019-11-17 MED ORDER — BENZONATATE 100 MG PO CAPS: 100.0000 mg | ORAL_CAPSULE | Freq: Three times a day (TID) | ORAL | 0 refills | Status: DC | PRN

## 2019-11-17 NOTE — Progress Notes (Signed)
I connected by phone with Rex Kras on 11/17/2019 at 4:21 PM to discuss the potential use of a new treatment for mild to moderate COVID-19 viral infection in non-hospitalized patients.  This patient is a 49 y.o. female that meets the FDA criteria for Emergency Use Authorization of COVID monoclonal antibody casirivimab/imdevimab.  Has a (+) direct SARS-CoV-2 viral test result  Has mild or moderate COVID-19   Is NOT hospitalized due to COVID-19  Is within 10 days of symptom onset  Has at least one of the high risk factor(s) for progression to severe COVID-19 and/or hospitalization as defined in EUA.  Specific high risk criteria : BMI > 25, Diabetes and Cardiovascular disease or hypertension   I have spoken and communicated the following to the patient or parent/caregiver regarding COVID monoclonal antibody treatment:  1. FDA has authorized the emergency use for the treatment of mild to moderate COVID-19 in adults and pediatric patients with positive results of direct SARS-CoV-2 viral testing who are 75 years of age and older weighing at least 40 kg, and who are at high risk for progressing to severe COVID-19 and/or hospitalization.  2. The significant known and potential risks and benefits of COVID monoclonal antibody, and the extent to which such potential risks and benefits are unknown.  3. Information on available alternative treatments and the risks and benefits of those alternatives, including clinical trials.  4. Patients treated with COVID monoclonal antibody should continue to self-isolate and use infection control measures (e.g., wear mask, isolate, social distance, avoid sharing personal items, clean and disinfect "high touch" surfaces, and frequent handwashing) according to CDC guidelines.   5. The patient or parent/caregiver has the option to accept or refuse COVID monoclonal antibody treatment.  After reviewing this information with the patient, The patient agreed to  proceed with receiving casirivimab\imdevimab infusion and will be provided a copy of the Fact sheet prior to receiving the infusion.  Sx onset 8/26. Set up for infusion on 9/3 @ 10:30am. Directions given to [facility]. Pt is aware that insurance will be charged an infusion fee. Pt is unvaccinated. She threw away a copy of her positive home test.   Angelena Form 11/17/2019 4:21 PM

## 2019-11-17 NOTE — Progress Notes (Signed)
Virtual Visit via Video Note  I connected with Kristen Meyers  on 11/17/19 at 11:20 AM EDT by a video enabled telemedicine application and verified that I am speaking with the correct person using two identifiers.  Location patient: home, [place] Location provider:work or home office Persons participating in the virtual visit: patient, provider  I discussed the limitations of evaluation and management by telemedicine and the availability of in person appointments. The patient expressed understanding and agreed to proceed.   HPI:  Acute visit for COVID19: -her family was exposed to [place] 8/22 -then multiple family members were together for a funeral and mulitple family members got sick -patient got sick starting on the 27th, positive covid test 28th -symptoms: feeling tired, sinus congestion, cough, fevers higher initially - now coming down but around 101 today, sometimes goes higher at night, mild SOB sometimes at night, lost taste, now feeling worse the last few days with nasal sinus pain, worsening cough -148/98 (but has not taken her blood pressure medications), O295, HR 90-low 100s -not much of an appetite, trying to stay hydrated -denies: inability to ambulate, inability to tol fluids/food, CP, worst headache -not vaccinated for covid -hx of DVT (due to a c section remotely), HTN and diabetes   ROS: See pertinent positives and negatives per HPI.  Past Medical History:  Diagnosis Date  . Acute DVT (deep venous thrombosis) (HCC)    DVT 2012-january   . Cholelithiasis without obstruction 09/19/09 (u/s)  . DEEP VENOUS THROMBOPHLEBITIS, HX OF 03/21/2010   Qualifier: Diagnosis of  By: Elease Hashimoto MD, Bruce    . Diabetes mellitus without complication ([place]) 04/25/9369  . Fatty liver 09/19/09 (u/s)   Mildly elevated transaminases  . GERD 03/21/2010  . History of adenomatous polyp of colon 12/2018   Recall 2023.  Marland Kitchen History of herpes zoster 03/2012  . HYPERLIPIDEMIA 03/21/2010   Intol of simva, atorva,  and rosuva---she declines further statin trial as of 10/2016  . HYPERTENSION 03/21/2010  . Obesity, Class II, BMI 35-39.9   . Seizures ([place])    as child- had 2 none since- no current treatment- never dx'd with any seizure d/o     Past Surgical History:  Procedure Laterality Date  . CESAREAN SECTION     2003, 2005  . COLONOSCOPY  01/05/2019   polyp ->recall 3 yrs  . LAPAROSCOPIC HYSTERECTOMY  02/2014   Ovaries remain (tubes out): done for DUB  . TUBAL LIGATION  2011   Dr. Reino Kent is her GYN.  . WISDOM TOOTH EXTRACTION      Family History  Problem Relation Age of Onset  . Cancer Mother        colon- pt is unsure ever had colon cancer, ovarian, lung, breast- breast primary cancer- dx;d in her 47's- she is still alive 12-2018  . Heart disease Mother   . Hypertension Mother   . Hyperlipidemia Mother   . Diabetes Mother        type ll  . Breast cancer Mother   . Ovarian cancer Mother   . Lung cancer Mother   . Esophageal cancer Mother   . Colon cancer Maternal Uncle   . Rectal cancer Neg Hx   . Stomach cancer Neg Hx     SOCIAL HX: see hpi   Current Outpatient Medications:  .  enalapril (VASOTEC) 20 MG tablet, TAKE 2 TABLETS BY MOUTH EVERY DAY, Disp: 60 tablet, Rfl: 5 .  glipiZIDE (GLUCOTROL XL) 10 MG 24 hr tablet, TAKE 1 TABLET (10 MG  TOTAL) BY MOUTH DAILY WITH BREAKFAST., Disp: 30 tablet, Rfl: 2 .  hydrochlorothiazide (HYDRODIURIL) 25 MG tablet, Take 1 tablet (25 mg total) by mouth daily., Disp: 90 tablet, Rfl: 3 .  INVOKANA 100 MG TABS tablet, TAKE 1 TABLET (100 MG TOTAL) BY MOUTH DAILY BEFORE BREAKFAST., Disp: 30 tablet, Rfl: 0 .  metFORMIN (GLUCOPHAGE) 1000 MG tablet, TAKE 1 TABLET (1,000 MG TOTAL) BY MOUTH 2 (TWO) TIMES DAILY WITH A MEAL., Disp: 60 tablet, Rfl: 6 .  metoprolol succinate (TOPROL-XL) 100 MG 24 hr tablet, TAKE 2 TABLETS BY MOUTH EVERY DAY, Disp: 60 tablet, Rfl: 0 .  Multiple Vitamins-Minerals (MULTIVITAMIN WITH MINERALS) tablet, Take 1 tablet by mouth daily.,  Disp: , Rfl:  .  benzonatate (TESSALON PERLES) 100 MG capsule, Take 1 capsule (100 mg total) by mouth 3 (three) times daily as needed., Disp: 20 capsule, Rfl: 0  EXAM:  VITALS per patient if applicable:  GENERAL: alert, oriented, appears well and in no acute distress  HEENT: atraumatic, conjunttiva clear, no obvious abnormalities on inspection of external nose and ears  NECK: normal movements of the head and neck  LUNGS: on inspection no signs of respiratory distress, breathing rate appears normal, no obvious gross SOB, gasping or wheezing, occ cough  CV: no obvious cyanosis  MS: moves all visible extremities without noticeable abnormality  PSYCH/NEURO: pleasant and cooperative, no obvious depression or anxiety, speech and thought processing grossly intact  ASSESSMENT AND PLAN:  Discussed the following assessment and plan:  Disease due to 2019 novel coronavirus  Essential hypertension  -we discussed possible serious and likely etiologies, options for evaluation and workup, limitations of telemedicine visit vs in person visit, treatment, treatment risks and precautions. Pt prefers to treat via telemedicine empirically rather then risking or undertaking an in person visit at this moment. Advised of treatment options, potential complications, symptomatic care options, home isolation and precautions for COVID19. Advised MAB and provided # to treatment center for her to call - advised to call right after this visit. Also, sent message in epic regarding this patient. Rx for cough sent. Her BP and HR are mildly elevated, but she did not take her BP medication. Advised taking her medications and also to not stop the metorpolol suddenly. Advised Vit D/C, nasal saline, tylenol if needed per instructions, staying hydrated, healthy diet and close monitoring and follow up. Sent message to schedulers to assist with follow up. Work/School slipped offered:in patient instructions Advised to seek prompt  follow up telemedicine visit or in person care if worsening, new symptoms arise, or if is not improving with treatment.    I discussed the assessment and treatment plan with the patient. The patient was provided an opportunity to ask questions and all were answered. The patient agreed with the plan and demonstrated an understanding of the instructions.   The patient was advised to call back or seek an in-person evaluation if the symptoms worsen or if the condition fails to improve as anticipated.   Lucretia Kern, DO

## 2019-11-17 NOTE — Patient Instructions (Signed)
-  stay home while sick and for a full 10 days since onset of symptoms PLUS one day of no fever and feeling better when you have COVID19  -I sent the medication(s) we discussed to your pharmacy: Meds ordered this encounter  Medications  . benzonatate (TESSALON PERLES) 100 MG capsule    Sig: Take 1 capsule (100 mg total) by mouth 3 (three) times daily as needed.    Dispense:  20 capsule    Refill:  0    -call number provided for outpatient COVID19 treatment center [phone]  -can use tylenol if needed for fevers, aches and pains per instructions  -can use nasal saline a few times per day if nasal congestion, sometime a short course of Afrin nasal spray for 3 days can help as well  -stay hydrated, drink plenty of fluids and eat small healthy meals - avoid dairy  -can take 1000 IU Vit D3 and Vit C lozenges per instructions on product  -check out the [facility] website for more information  -follow up with your doctor in 2-3 days unless improving and feeling better  -take your blood pressure medication daily and monitor BP - if continues to run over 140/80 contact your primary care provider to schedule a follow up visit.  I hope you are feeling better soon! Seek in-person care or a follow up telemedicine visit promptly if your symptoms worsen, new concerns arise or you are not improving as expected. Call 911 if severe symptoms.

## 2019-11-18 ENCOUNTER — Ambulatory Visit (HOSPITAL_COMMUNITY)
Admission: RE | Admit: 2019-11-18 | Discharge: 2019-11-18 | Disposition: A | Discharge status: Home / Self Care | Payer: BC Managed Care – PPO | Source: Ambulatory Visit | Attending: Pulmonary Disease | Admitting: Pulmonary Disease

## 2019-11-18 DIAGNOSIS — I10 Essential (primary) hypertension: Secondary | ICD-10-CM | POA: Insufficient documentation

## 2019-11-18 DIAGNOSIS — E119 Type 2 diabetes mellitus without complications: Secondary | ICD-10-CM | POA: Diagnosis not present

## 2019-11-18 DIAGNOSIS — U071 COVID-19: Secondary | ICD-10-CM

## 2019-11-18 LAB — GLUCOSE, CAPILLARY: Glucose-Capillary: 242 mg/dL — ABNORMAL HIGH (ref 70–99)

## 2019-11-18 MED ORDER — FAMOTIDINE IN NACL 20-0.9 MG/50ML-% IV SOLN: 20.0000 mg | Freq: Once | INTRAVENOUS | Status: DC | PRN

## 2019-11-18 MED ORDER — EPINEPHRINE 0.3 MG/0.3ML IJ SOAJ: 0.3000 mg | Freq: Once | INTRAMUSCULAR | Status: DC | PRN

## 2019-11-18 MED ORDER — ACETAMINOPHEN 325 MG PO TABS
325.0000 mg | ORAL_TABLET | Freq: Four times a day (QID) | ORAL | Status: DC | PRN
  Administered 2019-11-18: 325 mg via ORAL
  Administered 2019-11-18: 650 mg via ORAL
  Filled 2019-11-18: qty 1

## 2019-11-18 MED ORDER — DIPHENHYDRAMINE HCL 50 MG/ML IJ SOLN: 50.0000 mg | Freq: Once | INTRAMUSCULAR | Status: DC | PRN

## 2019-11-18 MED ORDER — ALBUTEROL SULFATE HFA 108 (90 BASE) MCG/ACT IN AERS: 2.0000 | INHALATION_SPRAY | Freq: Once | RESPIRATORY_TRACT | Status: DC | PRN

## 2019-11-18 MED ORDER — METHYLPREDNISOLONE SODIUM SUCC 125 MG IJ SOLR: 125.0000 mg | Freq: Once | INTRAMUSCULAR | Status: DC | PRN

## 2019-11-18 MED ORDER — SODIUM CHLORIDE 0.9 % IV SOLN: INTRAVENOUS | Status: DC | PRN

## 2019-11-18 MED ORDER — SODIUM CHLORIDE 0.9 % IV SOLN
1200.0000 mg | Freq: Once | INTRAVENOUS | Status: AC
  Administered 2019-11-18: 1200 mg via INTRAVENOUS
  Filled 2019-11-18: qty 10

## 2019-11-18 NOTE — Progress Notes (Signed)
S: Kristen Meyers is a 49 year old woman with h/o obesity, diabetes, and heart disease here today for Regeneron treatment.  Patient febrile at arrival at 103.  Was claled urgently to room as patient was witnessed by nurses to have "passed out".  Her BP was 60/40 per nurse report.  Kristen Meyers tells me that she has been drinking water some today, and she ate breakfast.  She notes that she had abruptly felt dizzy, and felt as if she was in a tunnel.  She did not lose consciousness.  She has no chest pain, palpitations.  She is not short of breath.    O: VS initally 60/40, HR unable to be recalled, T 103.2; VS improved to 116/72, 79, oxygen saturation remained 99% throughout.   Patient initially appeared diaphoretic and slightly tachpneic.  With lying down, however this relaxed.   Cardiac: RRR, S1 and S2, no murmurs, rubs, gallops Lungs: Clear to auscultation bilaterally.  Ext: normal cap refill, no peripheral edema.    Blood sugar: 242  A: COVID 19 infection  (a) likely vagal episode when getting IV  P: Kristen Meyers will receive one liter IV fluids, 650mg  of Tylenol.  I stayed in the room with her for about 10 minutes and she was starting to feel improved.  She will likely be able to receive Regeneron after her fluid bolus.  I will check on her afterward with VS, and re evaluate.    I called her husband, Kristen Meyers and updated him with the situation.    Dr. Lake Bells and Dr. Vaughan Browner notified and agree with above plan.    Kristen Bihari, NP

## 2019-11-18 NOTE — Discharge Instructions (Signed)

## 2019-11-18 NOTE — Progress Notes (Signed)
  Diagnosis: COVID-19  Physician: Dr. Asencion Noble  Procedure: [facility] Med: casirivimab\imdevimab infusion - Provided patient with casirivimab\imdevimab fact sheet for patients, parents and caregivers prior to infusion.  Complications: No immediate complications noted.  Discharge: Discharged home   Gaye Alken 11/18/2019

## 2019-11-23 ENCOUNTER — Telehealth: Payer: Self-pay

## 2019-11-23 NOTE — Telephone Encounter (Signed)
Yes, temp of 99 is fine. A fever is actually >100.5.

## 2019-11-23 NOTE — Telephone Encounter (Signed)
Patient still has a fever of 99, extreme fatigue, and congested cough. She had an infusion on 11/19/19. Patient is asking is it normal to still be running a fever.

## 2019-11-23 NOTE — Telephone Encounter (Signed)
Pt had VV on 9/2 with Dr.Kim and infusion on 9/4. Please advise, thanks.

## 2019-11-24 NOTE — Telephone Encounter (Signed)
Left detailed message with information regarding temp, okay per [facility]

## 2019-11-30 ENCOUNTER — Other Ambulatory Visit: Payer: Self-pay

## 2019-12-01 ENCOUNTER — Ambulatory Visit: Payer: BC Managed Care – PPO

## 2019-12-01 ENCOUNTER — Ambulatory Visit: Payer: BC Managed Care – PPO | Admitting: Family Medicine

## 2019-12-01 ENCOUNTER — Encounter: Payer: Self-pay | Admitting: Family Medicine

## 2019-12-01 VITALS — BP 108/76 | HR 85 | Temp 98.0°F | Resp 16 | Ht 63.0 in | Wt 209.2 lb

## 2019-12-01 DIAGNOSIS — Z86718 Personal history of other venous thrombosis and embolism: Secondary | ICD-10-CM | POA: Diagnosis not present

## 2019-12-01 DIAGNOSIS — M79605 Pain in left leg: Secondary | ICD-10-CM

## 2019-12-01 DIAGNOSIS — I82442 Acute embolism and thrombosis of left tibial vein: Secondary | ICD-10-CM | POA: Diagnosis not present

## 2019-12-01 DIAGNOSIS — I82890 Acute embolism and thrombosis of other specified veins: Secondary | ICD-10-CM | POA: Diagnosis not present

## 2019-12-01 LAB — COMPREHENSIVE METABOLIC PANEL
ALT: 49 U/L — ABNORMAL HIGH (ref 0–35)
AST: 30 U/L (ref 0–37)
Albumin: 3.9 g/dL (ref 3.5–5.2)
Alkaline Phosphatase: 67 U/L (ref 39–117)
BUN: 18 mg/dL (ref 6–23)
CO2: 29 mEq/L (ref 19–32)
Calcium: 10.3 mg/dL (ref 8.4–10.5)
Chloride: 101 mEq/L (ref 96–112)
Creatinine, Ser: 0.69 mg/dL (ref 0.40–1.20)
GFR: 89.73 mL/min (ref >60.00)
Glucose, Bld: 203 mg/dL — ABNORMAL HIGH (ref 70–99)
Potassium: 4.6 mEq/L (ref 3.5–5.1)
Sodium: 139 mEq/L (ref 135–145)
Total Bilirubin: 0.4 mg/dL (ref 0.2–1.2)
Total Protein: 6.6 g/dL (ref 6.0–8.3)

## 2019-12-01 LAB — CBC WITH DIFFERENTIAL/PLATELET
Basophils Absolute: 0.1 10*3/uL (ref 0.0–0.1)
Basophils Relative: 1.1 % (ref 0.0–3.0)
Eosinophils Absolute: 0.1 10*3/uL (ref 0.0–0.7)
Eosinophils Relative: 1.3 % (ref 0.0–5.0)
HCT: 37.4 % (ref 36.0–46.0)
Hemoglobin: 12.3 g/dL (ref 12.0–15.0)
Lymphocytes Relative: 25.9 % (ref 12.0–46.0)
Lymphs Abs: 1.4 10*3/uL (ref 0.7–4.0)
MCHC: 32.8 g/dL (ref 30.0–36.0)
MCV: 86.9 fl (ref 78.0–100.0)
Monocytes Absolute: 0.6 10*3/uL (ref 0.1–1.0)
Monocytes Relative: 10.2 % (ref 3.0–12.0)
Neutro Abs: 3.4 10*3/uL (ref 1.4–7.7)
Neutrophils Relative %: 61.5 % (ref 43.0–77.0)
Platelets: 326 10*3/uL (ref 150.0–400.0)
RBC: 4.3 Mil/uL (ref 3.87–5.11)
RDW: 14.1 % (ref 11.5–15.5)
WBC: 5.5 10*3/uL (ref 4.0–10.5)

## 2019-12-01 LAB — PROTIME-INR
INR: 1 ratio (ref 0.8–1.0)
Prothrombin Time: 11 s (ref 9.6–13.1)

## 2019-12-01 NOTE — Progress Notes (Signed)
OFFICE VISIT  12/01/2019  CC:  Chief Complaint  Patient presents with  . Pain    right leg    HPI:    Patient is a 49 y.o. Caucasian female who presents for pain in left leg. Always with L upper and lower leg tightness and cramps.  Onset of worsening about 2 wks ago after an innocent fall, landed on L knee and jarred it. Pain behind L knee and L calf, some swelling noted in L ankle lately.  Some tingling/itchy sensation in LL.  Any pressure on area behind knee and on back of thigh hurts some. No R leg sxs at all. No SOB, CP, or palpitations.  Has not had covid vaccine.   Has some neck tightness, muscular.  Has popping in R jaw, wears night guard and it helps some.  +Hx of acute L leg popliteal DVT 2012, this was after emergency C -section.  Got coumadin treatment 3 mo.  Past Medical History:  Diagnosis Date  . Acute DVT (deep venous thrombosis) (HCC)    DVT 2012-january   . Cholelithiasis without obstruction 09/19/09 (u/s)  . COVID-19 virus infection    approx 11/09/19---got antibody infusion  . DEEP VENOUS THROMBOPHLEBITIS, HX OF 03/21/2010   Qualifier: Diagnosis of  By: Elease Hashimoto MD, Bruce    . Diabetes mellitus without complication ([place]) 04/18/2977  . Fatty liver 09/19/09 (u/s)   Mildly elevated transaminases  . GERD 03/21/2010  . History of adenomatous polyp of colon 12/2018   Recall 2023.  Marland Kitchen History of herpes zoster 03/2012  . HYPERLIPIDEMIA 03/21/2010   Intol of simva, atorva, and rosuva---she declines further statin trial as of 10/2016  . HYPERTENSION 03/21/2010  . Obesity, Class II, BMI 35-39.9   . Seizures ([place])    as child- had 2 none since- no current treatment- never dx'd with any seizure d/o     Past Surgical History:  Procedure Laterality Date  . CESAREAN SECTION     2003, 2005  . COLONOSCOPY  01/05/2019   polyp ->recall 3 yrs  . LAPAROSCOPIC HYSTERECTOMY  02/2014   Ovaries remain (tubes out): done for DUB  . TUBAL LIGATION  2011   Dr. Reino Kent is her GYN.  .  WISDOM TOOTH EXTRACTION      Outpatient Medications Prior to Visit  Medication Sig Dispense Refill  . benzonatate (TESSALON PERLES) 100 MG capsule Take 1 capsule (100 mg total) by mouth 3 (three) times daily as needed. 20 capsule 0  . enalapril (VASOTEC) 20 MG tablet TAKE 2 TABLETS BY MOUTH EVERY DAY 60 tablet 5  . glipiZIDE (GLUCOTROL XL) 10 MG 24 hr tablet TAKE 1 TABLET (10 MG TOTAL) BY MOUTH DAILY WITH BREAKFAST. 30 tablet 2  . hydrochlorothiazide (HYDRODIURIL) 25 MG tablet Take 1 tablet (25 mg total) by mouth daily. 90 tablet 3  . INVOKANA 100 MG TABS tablet TAKE 1 TABLET (100 MG TOTAL) BY MOUTH DAILY BEFORE BREAKFAST. (Patient taking differently: 200 mg. ) 30 tablet 0  . metFORMIN (GLUCOPHAGE) 1000 MG tablet TAKE 1 TABLET (1,000 MG TOTAL) BY MOUTH 2 (TWO) TIMES DAILY WITH A MEAL. 60 tablet 6  . metoprolol succinate (TOPROL-XL) 100 MG 24 hr tablet TAKE 2 TABLETS BY MOUTH EVERY DAY 60 tablet 0  . Multiple Vitamins-Minerals (MULTIVITAMIN WITH MINERALS) tablet Take 1 tablet by mouth daily.     No facility-administered medications prior to visit.    Allergies  Allergen Reactions  . Codeine Hives    ROS As per HPI  PE: Vitals with BMI 12/01/2019 11/18/2019 11/18/2019  Height 5\' 3"  - -  Weight 209 lbs 3 oz - -  BMI 91.50 - -  Systolic 569 794 801  Diastolic 76 80 57  Pulse 85 97 98   Gen: Alert, well appearing.  Patient is oriented to person, place, time, and situation. AFFECT: pleasant, lucid thought and speech. CV: RRR, no m/r/g.   LUNGS: CTA bilat, nonlabored resps, good aeration in all lung fields. EXT: no clubbing or cyanosis.  no edema. No tenderness, erythema, warmth, or mass/cord. ROM of legs/knees normal. L calf circ 42 cm R calf circ 42.5 cm  L ankle circ 25cm.  R ankle circ 25 cm.   LABS:    Chemistry      Component Value Date/Time   NA 136 08/08/2019 0842   K 3.9 08/08/2019 0842   CL 101 08/08/2019 0842   CO2 24 08/08/2019 0842   BUN 22 08/08/2019 0842    CREATININE 0.86 08/08/2019 0842   CREATININE 0.63 08/04/2014 1543      Component Value Date/Time   CALCIUM 9.4 08/08/2019 0842   ALKPHOS 65 08/08/2019 0842   AST 23 08/08/2019 0842   ALT 45 (H) 08/08/2019 0842   BILITOT 0.5 08/08/2019 0842     Lab Results  Component Value Date   WBC 6.0 05/09/2019   HGB 13.5 05/09/2019   HCT 40.3 05/09/2019   MCV 87.1 05/09/2019   PLT 250.0 05/09/2019    IMPRESSION AND PLAN:  Left leg pain x 2-3 wks, etiology unknown.  She says these are the same sx's as when she had DVT in same leg in 2012.   Venous doppler ultrasound to eval for DVT this afternoon. CBC, CMET, PT/INR now.  An After Visit Summary was printed and given to the patient.  FOLLOW UP: Return for f/u to be determined based on result of work-up.  Signed:  Crissie Sickles, MD           12/01/2019

## 2019-12-02 ENCOUNTER — Telehealth: Payer: Self-pay | Admitting: Family Medicine

## 2019-12-02 ENCOUNTER — Other Ambulatory Visit: Payer: Self-pay | Admitting: Family Medicine

## 2019-12-02 MED ORDER — APIXABAN 5 MG PO TABS: ORAL_TABLET | ORAL | 0 refills | Status: DC

## 2019-12-02 NOTE — Telephone Encounter (Signed)
Spoke with pharmacist, Jenny Reichmann and provided update for PA approval.

## 2019-12-02 NOTE — Telephone Encounter (Signed)
Patient states [facility] needs information from our office. Insurance does not want to pay for that medication and is requesting Dr. Anitra Lauth to change to a cheaper medication.

## 2019-12-02 NOTE — Telephone Encounter (Signed)
PA completed and approved thru 12/01/20. Patient notified. Will contact pharmacy and provide update

## 2019-12-10 ENCOUNTER — Other Ambulatory Visit: Payer: Self-pay | Admitting: Family Medicine

## 2019-12-10 NOTE — Telephone Encounter (Signed)
Pt should have enough to make it to appt

## 2019-12-16 ENCOUNTER — Other Ambulatory Visit: Payer: Self-pay

## 2019-12-16 ENCOUNTER — Ambulatory Visit (INDEPENDENT_AMBULATORY_CARE_PROVIDER_SITE_OTHER): Payer: BC Managed Care – PPO | Admitting: Family Medicine

## 2019-12-16 ENCOUNTER — Encounter: Payer: Self-pay | Admitting: Family Medicine

## 2019-12-16 VITALS — BP 120/81 | HR 91 | Temp 97.9°F | Resp 20 | Ht 63.0 in | Wt 218.4 lb

## 2019-12-16 DIAGNOSIS — E782 Mixed hyperlipidemia: Secondary | ICD-10-CM

## 2019-12-16 DIAGNOSIS — I10 Essential (primary) hypertension: Secondary | ICD-10-CM

## 2019-12-16 DIAGNOSIS — E119 Type 2 diabetes mellitus without complications: Secondary | ICD-10-CM | POA: Diagnosis not present

## 2019-12-16 DIAGNOSIS — I82462 Acute embolism and thrombosis of left calf muscular vein: Secondary | ICD-10-CM | POA: Diagnosis not present

## 2019-12-16 LAB — POCT GLYCOSYLATED HEMOGLOBIN (HGB A1C)
HbA1c POC (<> result, manual entry): 8.2 % (ref 4.0–5.6)
HbA1c, POC (controlled diabetic range): 8.2 % — AB (ref 0.0–7.0)
HbA1c, POC (prediabetic range): 8.2 % — AB (ref 5.7–6.4)
Hemoglobin A1C: 8.2 % — AB (ref 4.0–5.6)

## 2019-12-16 MED ORDER — APIXABAN 5 MG PO TABS
ORAL_TABLET | ORAL | 2 refills | Status: DC
Start: 2019-12-16 — End: 2020-04-05

## 2019-12-16 MED ORDER — CANAGLIFLOZIN 300 MG PO TABS: 300.0000 mg | ORAL_TABLET | Freq: Every day | ORAL | 2 refills | Status: DC

## 2019-12-16 MED ORDER — ATORVASTATIN CALCIUM 20 MG PO TABS: 20.0000 mg | ORAL_TABLET | Freq: Every day | ORAL | 2 refills | Status: DC

## 2019-12-16 NOTE — Progress Notes (Signed)
OFFICE VISIT  12/16/2019  CC:  Chief Complaint  Patient presents with  . Follow-up    2 week followup    HPI:    Patient is a 49 y.o. Caucasian female who presents for 2 week f/u L popliteal and L tibial vein DVT. I started her on eliquis on 12/02/19.  INTERIM HX: Pain in leg is all gone, doing well now, no probs taking eliquis. No sign of bleeding.   Looking back, pt feels like she has had the DVT for a couple months-->sx's onset PRIOR to getting covid.  Sx's started mild and so she waited.   DM: taking invokana 200mg  (increased from 100 mg qd about 4 mo ago), glipizide xl 10mg  qd, and metformin 1000 mg bid. No home glucose monitoring.  Denies polyuria or polydipsia.  HLD: hx of statin intolerance (simva).  Most recent lipid panel was 04/2019, LDL 156, trigs in mid 200's. She now says the leg cramps on simvastatin may have actually been due to too much caffeine intake.  She is open to retry of statin now.  HTN: taking enalapril 20mg  qd, hctz 25mg  qd, and toprol xl 200 mg qd.  Well controlled at the time of last f/u for this 07/2019. No home monitoring.  BP today excellent.  ROS: no fevers, no CP, no SOB, no wheezing, no cough, no dizziness, no HAs, no rashes, no melena/hematochezia.  No polyuria or polydipsia.  No myalgias or arthralgias.  No focal weakness, paresthesias, or tremors.  No acute vision or hearing abnormalities. No n/v/d or abd pain.  No palpitations.      Past Medical History:  Diagnosis Date  . Acute DVT (deep venous thrombosis) (HCC)    DVT 2012-january, L leg, in the context of post-C section  . Cholelithiasis without obstruction 09/19/09 (u/s)  . COVID-19 virus infection    approx 11/09/19---got antibody infusion  . DEEP VENOUS THROMBOPHLEBITIS, HX OF 03/21/2010   Qualifier: Diagnosis of  By: Elease Hashimoto MD, Bruce    . Diabetes mellitus without complication ([place]) 10/15/1912  . Fatty liver 09/19/09 (u/s)   Mildly elevated transaminases  . GERD 03/21/2010  . History  of adenomatous polyp of colon 12/2018   Recall 2023.  Marland Kitchen History of herpes zoster 03/2012  . HYPERLIPIDEMIA 03/21/2010   Intol of simva?  . HYPERTENSION 03/21/2010  . Obesity, Class II, BMI 35-39.9   . Seizures ([place])    as child- had 2 none since- no current treatment- never dx'd with any seizure d/o     Past Surgical History:  Procedure Laterality Date  . CESAREAN SECTION     2003, 2005  . COLONOSCOPY  01/05/2019   polyp ->recall 3 yrs  . LAPAROSCOPIC HYSTERECTOMY  02/2014   Ovaries remain (tubes out): done for DUB  . TUBAL LIGATION  2011   Dr. Reino Kent is her GYN.  . WISDOM TOOTH EXTRACTION      Outpatient Medications Prior to Visit  Medication Sig Dispense Refill  . benzonatate (TESSALON PERLES) 100 MG capsule Take 1 capsule (100 mg total) by mouth 3 (three) times daily as needed. 20 capsule 0  . enalapril (VASOTEC) 20 MG tablet TAKE 2 TABLETS BY MOUTH EVERY DAY 60 tablet 5  . glipiZIDE (GLUCOTROL XL) 10 MG 24 hr tablet TAKE 1 TABLET (10 MG TOTAL) BY MOUTH DAILY WITH BREAKFAST. 30 tablet 2  . hydrochlorothiazide (HYDRODIURIL) 25 MG tablet Take 1 tablet (25 mg total) by mouth daily. 90 tablet 3  . metFORMIN (GLUCOPHAGE) 1000 MG  tablet TAKE 1 TABLET (1,000 MG TOTAL) BY MOUTH 2 (TWO) TIMES DAILY WITH A MEAL. 60 tablet 6  . metoprolol succinate (TOPROL-XL) 100 MG 24 hr tablet TAKE 2 TABLETS BY MOUTH EVERY DAY 60 tablet 0  . Multiple Vitamins-Minerals (MULTIVITAMIN WITH MINERALS) tablet Take 1 tablet by mouth daily.    Marland Kitchen apixaban (ELIQUIS) 5 MG TABS tablet 2 tabs po bid x 7 days, then 1 tab po bid 74 tablet 0  . INVOKANA 100 MG TABS tablet TAKE 1 TABLET (100 MG TOTAL) BY MOUTH DAILY BEFORE BREAKFAST. (Patient taking differently: 200 mg. ) 30 tablet 0   No facility-administered medications prior to visit.    Allergies  Allergen Reactions  . Codeine Hives    ROS As per HPI  PE: Vitals with BMI 12/16/2019 12/01/2019 11/18/2019  Height 5\' 3"  5\' 3"  -  Weight 218 lbs 6 oz 209 lbs 3 oz  -  BMI 79.8 92.11 -  Systolic 941 740 814  Diastolic 81 76 80  Pulse 91 85 97  O2 sat on RA today is 95%   Gen: Alert, well appearing.  Patient is oriented to person, place, time, and situation. AFFECT: pleasant, lucid thought and speech. CV: RRR, no m/r/g.   LUNGS: CTA bilat, nonlabored resps, good aeration in all lung fields. EXT: no clubbing or cyanosis.  no edema.  No tenderness or asymmetry.   LABS:    Chemistry      Component Value Date/Time   NA 139 12/01/2019 1128   K 4.6 12/01/2019 1128   CL 101 12/01/2019 1128   CO2 29 12/01/2019 1128   BUN 18 12/01/2019 1128   CREATININE 0.69 12/01/2019 1128   CREATININE 0.63 08/04/2014 1543      Component Value Date/Time   CALCIUM 10.3 12/01/2019 1128   ALKPHOS 67 12/01/2019 1128   AST 30 12/01/2019 1128   ALT 49 (H) 12/01/2019 1128   BILITOT 0.4 12/01/2019 1128     Lab Results  Component Value Date   WBC 5.5 12/01/2019   HGB 12.3 12/01/2019   HCT 37.4 12/01/2019   MCV 86.9 12/01/2019   PLT 326.0 12/01/2019   Lab Results  Component Value Date   INR 1.0 12/01/2019   Lab Results  Component Value Date   HGBA1C 8.2 (A) 12/16/2019   HGBA1C 8.2 12/16/2019   HGBA1C 8.2 (A) 12/16/2019   HGBA1C 8.2 (A) 12/16/2019   Lab Results  Component Value Date   TSH 5.32 (H) 05/09/2019    Lab Results  Component Value Date   CHOL 260 (H) 05/09/2019   HDL 53.30 05/09/2019   LDLCALC 156 (H) 12/08/2018   LDLDIRECT 181.0 05/09/2019   TRIG 258.0 (H) 05/09/2019   CHOLHDL 5 05/09/2019   Lab Results  Component Value Date   CKTOTAL 86 08/08/2019   POC HbA1c today is 8.2%  IMPRESSION AND PLAN:  1) L leg DVT, "unprovoked".  Left leg feeling back to normal since getting on eliquis! Her first DVT back in 2012 was in post-surgical setting (C section). Continue eliquis x 3 mo.  She knows what to watch for regarding eliquis side effects/risks. Plan repeat left leg venous doppler 3 mo.  2) DM 2, hx of poor control. Hba1c today:  8.2% Increase invokana to 300 mg qd, continue glipizide xl 10mg  qd and metformin 1000 mg bid.  3) HTN: well controlled.  Continue hctz 25 qd, toprol xl 200 qd, and enalapril 20 qd. Lytes/cr normal 2 wks ago.  4) HLD:  question of myalgias on simvastatin in the past but now she thinks it was too much caffeine causing the sx's.   Pt does NOT recall trying any other statin. Will start trial of atorva 20mg  qd.    An After Visit Summary was printed and given to the patient.  FOLLOW UP: Return in about 3 months (around 03/17/2020) for annual CPE (fasting) + RCI f/u.  Signed:  Crissie Sickles, MD           12/16/2019

## 2019-12-19 ENCOUNTER — Other Ambulatory Visit: Payer: Self-pay | Admitting: Family Medicine

## 2019-12-27 DIAGNOSIS — Z1329 Encounter for screening for other suspected endocrine disorder: Secondary | ICD-10-CM | POA: Diagnosis not present

## 2019-12-27 DIAGNOSIS — N6459 Other signs and symptoms in breast: Secondary | ICD-10-CM | POA: Diagnosis not present

## 2020-01-02 ENCOUNTER — Other Ambulatory Visit: Payer: Self-pay | Admitting: Obstetrics and Gynecology

## 2020-01-02 DIAGNOSIS — N644 Mastodynia: Secondary | ICD-10-CM

## 2020-01-13 ENCOUNTER — Other Ambulatory Visit: Payer: Self-pay | Admitting: Family Medicine

## 2020-01-19 ENCOUNTER — Ambulatory Visit: Payer: BC Managed Care – PPO

## 2020-01-19 ENCOUNTER — Ambulatory Visit
Admission: RE | Admit: 2020-01-19 | Discharge: 2020-01-19 | Disposition: A | Discharge status: Home / Self Care | Payer: BC Managed Care – PPO | Source: Ambulatory Visit | Attending: Obstetrics and Gynecology | Admitting: Obstetrics and Gynecology

## 2020-01-19 ENCOUNTER — Other Ambulatory Visit: Payer: Self-pay

## 2020-01-19 DIAGNOSIS — N644 Mastodynia: Secondary | ICD-10-CM

## 2020-01-19 DIAGNOSIS — R928 Other abnormal and inconclusive findings on diagnostic imaging of breast: Secondary | ICD-10-CM | POA: Diagnosis not present

## 2020-01-19 LAB — HM MAMMOGRAPHY

## 2020-02-14 DIAGNOSIS — I82409 Acute embolism and thrombosis of unspecified deep veins of unspecified lower extremity: Secondary | ICD-10-CM

## 2020-02-14 DIAGNOSIS — R1900 Intra-abdominal and pelvic swelling, mass and lump, unspecified site: Secondary | ICD-10-CM | POA: Diagnosis not present

## 2020-02-14 DIAGNOSIS — Z01419 Encounter for gynecological examination (general) (routine) without abnormal findings: Secondary | ICD-10-CM | POA: Diagnosis not present

## 2020-02-14 DIAGNOSIS — Z90710 Acquired absence of both cervix and uterus: Secondary | ICD-10-CM

## 2020-02-14 DIAGNOSIS — Z6838 Body mass index (BMI) 38.0-38.9, adult: Secondary | ICD-10-CM | POA: Diagnosis not present

## 2020-02-14 HISTORY — DX: Acute embolism and thrombosis of unspecified deep veins of unspecified lower extremity: I82.409

## 2020-02-14 HISTORY — DX: Acquired absence of both cervix and uterus: Z90.710

## 2020-02-22 ENCOUNTER — Ambulatory Visit: Payer: BC Managed Care – PPO | Admitting: Family Medicine

## 2020-02-27 ENCOUNTER — Encounter: Payer: Self-pay | Admitting: Family Medicine

## 2020-02-27 ENCOUNTER — Other Ambulatory Visit: Payer: Self-pay

## 2020-02-27 ENCOUNTER — Ambulatory Visit: Payer: BC Managed Care – PPO | Admitting: Family Medicine

## 2020-02-27 VITALS — BP 95/59 | HR 99 | Temp 97.4°F | Resp 16 | Ht 63.0 in | Wt 216.6 lb

## 2020-02-27 DIAGNOSIS — U099 Post covid-19 condition, unspecified: Secondary | ICD-10-CM

## 2020-02-27 DIAGNOSIS — I10 Essential (primary) hypertension: Secondary | ICD-10-CM

## 2020-02-27 DIAGNOSIS — I82402 Acute embolism and thrombosis of unspecified deep veins of left lower extremity: Secondary | ICD-10-CM | POA: Diagnosis not present

## 2020-02-27 DIAGNOSIS — I959 Hypotension, unspecified: Secondary | ICD-10-CM

## 2020-02-27 DIAGNOSIS — R442 Other hallucinations: Secondary | ICD-10-CM

## 2020-02-27 NOTE — Patient Instructions (Signed)
Monitor your blood pressure tonight and tomorrow morning. Do not take your blood pressure medication (hctz, metoprolol, enalapril) if blood pressure <120 on top or <60 on bottom.

## 2020-02-27 NOTE — Progress Notes (Signed)
See student note from this date. I personally was present during the history, physical exam, and medical decision-making activities of this service and have verified that the service and findings are accurately documented in the student's note. Signed:  Phil Evelisse Szalkowski, MD           02/27/2020  

## 2020-02-27 NOTE — Progress Notes (Signed)
CC: "phantosmia"  HPI:  Kristen Meyers is a 49 yo female with past medical history significant for Covid-19 infection in Sept 2021 who presents today for a two month history of "phantom smells."  Kristen Meyers reports that she began noticing phantom smells two months ago. She does not recall an inciting event. She reports that the smells come in two forms: a lighter garlic-onion smell, or a heavier gasoline smell that also effects her taste. She states that these episodes of phantom smells occurs daily, 3 to 4 times per day. She states others around her do not smell anything during these episodes. She reports that they were decreasing in frequency until she spent more time outside in the cold which has recently intensified the smells. She also endorses breezes from air vents at work or heavy wind outside as aggravating factors. She has not found any alleviating agents. Of note, patient had Covid-19 infection in Sept this year during which she had similar taste of garlic-onion in her mouth. The taste had resolved prior to this 2 month history of phantom smells. Patient reports two recent falls from clumsiness, tripping over items on the floor. She did not hit her head, or experience loss of consciousness during these falls. Patient denies recent infections. Patient denies any fever, chills, headaches, nasal congestion, rhinorrhea, cough, sputum production, dizziness, LOC, or any head trauma.  Patient on Eliquis for DVT prophylaxis following unprovoked DVT (vs covid 19-associated). Denies any bleeding, including from gums, no blood per rectum.  Patient does not measure BP at home. States during clinic visits at other providers it usually will be 120/80. Patient denies dizziness, light-headedness, or palpitations.    PMH: Past Medical History:  Diagnosis Date  . Acute DVT (deep venous thrombosis) (HCC)    DVT 2012-january, L leg, in the context of post-C section  . Cholelithiasis without obstruction  09/19/09 (u/s)  . COVID-19 virus infection    approx 11/09/19---got antibody infusion  . DEEP VENOUS THROMBOPHLEBITIS, HX OF 03/21/2010   Qualifier: Diagnosis of  By: Elease Hashimoto MD, Bruce    . Diabetes mellitus without complication ([place]) 04[birth year]18/1939  . Fatty liver 09/19/09 (u/s)   Mildly elevated transaminases  . GERD 03/21/2010  . History of adenomatous polyp of colon 12/2018   Recall 2023.  Marland Kitchen History of herpes zoster 03/2012  . HYPERLIPIDEMIA 03/21/2010   Intol of simva?  . HYPERTENSION 03/21/2010  . Obesity, Class II, BMI 35-39.9   . Seizures ([place])    as child- had 2 none since- no current treatment- never dx'd with any seizure d/o     M/A: Current Outpatient Medications on File Prior to Visit  Medication Sig Dispense Refill  . apixaban (ELIQUIS) 5 MG TABS tablet 1 tab po bid 60 tablet 2  . atorvastatin (LIPITOR) 20 MG tablet Take 1 tablet (20 mg total) by mouth daily. 30 tablet 2  . benzonatate (TESSALON PERLES) 100 MG capsule Take 1 capsule (100 mg total) by mouth 3 (three) times daily as needed. 20 capsule 0  . canagliflozin (INVOKANA) 300 MG TABS tablet Take 1 tablet (300 mg total) by mouth daily before breakfast. 30 tablet 2  . enalapril (VASOTEC) 20 MG tablet TAKE 2 TABLETS BY MOUTH EVERY DAY 60 tablet 5  . glipiZIDE (GLUCOTROL XL) 10 MG 24 hr tablet TAKE 1 TABLET (10 MG TOTAL) BY MOUTH DAILY WITH BREAKFAST. 30 tablet 2  . hydrochlorothiazide (HYDRODIURIL) 25 MG tablet Take 1 tablet (25 mg total) by mouth daily. 90 tablet 3  .  metFORMIN (GLUCOPHAGE) 1000 MG tablet TAKE 1 TABLET (1,000 MG TOTAL) BY MOUTH 2 (TWO) TIMES DAILY WITH A MEAL. 60 tablet 6  . metoprolol succinate (TOPROL-XL) 100 MG 24 hr tablet TAKE 2 TABLETS BY MOUTH EVERY DAY 180 tablet 0  . Multiple Vitamins-Minerals (MULTIVITAMIN WITH MINERALS) tablet Take 1 tablet by mouth daily.     No current facility-administered medications on file prior to visit.   Allergies  Allergen Reactions  . Codeine Hives    FH: Family  History  Problem Relation Age of Onset  . Cancer Mother        colon- pt is unsure ever had colon cancer, ovarian, lung, breast- breast primary cancer- dx;d in her 22's- she is still alive 12-2018  . Heart disease Mother   . Hypertension Mother   . Hyperlipidemia Mother   . Diabetes Mother        type ll  . Breast cancer Mother   . Ovarian cancer Mother   . Lung cancer Mother   . Esophageal cancer Mother   . Colon cancer Maternal Uncle   . Rectal cancer Neg Hx   . Stomach cancer Neg Hx     SH: Social History   Socioeconomic History  . Marital status: Married    Spouse name: Not on file  . Number of children: Not on file  . Years of education: Not on file  . Highest education level: Not on file  Occupational History  . Not on file  Tobacco Use  . Smoking status: Never Smoker  . Smokeless tobacco: Never Used  Vaping Use  . Vaping Use: Never used  Substance and Sexual Activity  . Alcohol use: No  . Drug use: No  . Sexual activity: Not on file  Other Topics Concern  . Not on file  Social History Narrative   Married, 1 child.   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   Orig from [place], currently living in [place].   No T/A/Ds.   Exercise: 1- 3 days a week, steps + cardio.   Social Determinants of Health   Financial Resource Strain: Not on file  Food Insecurity: Not on file  Transportation Needs: Not on file  Physical Activity: Not on file  Stress: Not on file  Social Connections: Not on file    ROS: Review of Systems  Constitutional: Negative for chills and fever.  HENT: Negative for congestion and sinus pain.   Eyes: Negative for blurred vision.  Respiratory: Negative for cough, sputum production and shortness of breath.   Cardiovascular: Negative for palpitations.  Neurological: Negative for dizziness, loss of consciousness and headaches.    PE: Vitals with BMI 12/16/2019 12/01/2019 11/18/2019  Height 5\' 3"  5\' 3"   -  Weight 218 lbs 6 oz 209 lbs 3 oz -  BMI 12.8 78.67 -  Systolic 672 094 709  Diastolic 81 76 80  Pulse 91 85 97    Physical Exam Vitals reviewed.  Constitutional:      General: She is not in acute distress.    Appearance: Normal appearance.  HENT:     Head: Normocephalic and atraumatic.     Nose: Nose normal.     Mouth/Throat:     Mouth: Mucous membranes are moist.     Pharynx: Oropharynx is clear.  Eyes:     Extraocular Movements: Extraocular movements intact.     Pupils: Pupils are equal, round, and reactive to light.  Cardiovascular:     Rate and  Rhythm: Normal rate and regular rhythm.     Heart sounds: Normal heart sounds. No murmur heard. No friction rub. No gallop.   Pulmonary:     Effort: Pulmonary effort is normal.     Breath sounds: Normal breath sounds.  Neurological:     General: No focal deficit present.     Mental Status: She is alert and oriented to person, place, and time.  Psychiatric:        Mood and Affect: Mood normal.        Behavior: Behavior normal.     Labs: Recent Results (from the past 2160 hour(s))  CBC with Differential/Platelet     Status: None   Collection Time: 12/01/19 11:28 AM  Result Value Ref Range   WBC 5.5 4.0 - 10.5 K/uL   RBC 4.30 3.87 - 5.11 Mil/uL   Hemoglobin 12.3 12.0 - 15.0 g/dL   HCT 37.4 36.0 - 46.0 %   MCV 86.9 78.0 - 100.0 fl   MCHC 32.8 30.0 - 36.0 g/dL   RDW 14.1 11.5 - 15.5 %   Platelets 326.0 150.0 - 400.0 K/uL   Neutrophils Relative % 61.5 43.0 - 77.0 %   Lymphocytes Relative 25.9 12.0 - 46.0 %   Monocytes Relative 10.2 3.0 - 12.0 %   Eosinophils Relative 1.3 0.0 - 5.0 %   Basophils Relative 1.1 0.0 - 3.0 %   Neutro Abs 3.4 1.4 - 7.7 K/uL   Lymphs Abs 1.4 0.7 - 4.0 K/uL   Monocytes Absolute 0.6 0.1 - 1.0 K/uL   Eosinophils Absolute 0.1 0.0 - 0.7 K/uL   Basophils Absolute 0.1 0.0 - 0.1 K/uL  Protime-INR     Status: None   Collection Time: 12/01/19 11:28 AM  Result Value Ref Range   INR 1.0 0.8 - 1.0  ratio   Prothrombin Time 11.0 9.6 - 13.1 sec  Comprehensive metabolic panel     Status: Abnormal   Collection Time: 12/01/19 11:28 AM  Result Value Ref Range   Sodium 139 135 - 145 mEq/L   Potassium 4.6 3.5 - 5.1 mEq/L   Chloride 101 96 - 112 mEq/L   CO2 29 19 - 32 mEq/L   Glucose, Bld 203 (H) 70 - 99 mg/dL   BUN 18 6 - 23 mg/dL   Creatinine, Ser 0.69 0.40 - 1.20 mg/dL   Total Bilirubin 0.4 0.2 - 1.2 mg/dL   Alkaline Phosphatase 67 39 - 117 U/L   AST 30 0 - 37 U/L   ALT 49 (H) 0 - 35 U/L   Total Protein 6.6 6.0 - 8.3 g/dL   Albumin 3.9 3.5 - 5.2 g/dL   GFR 89.73 >60.00 mL/min   Calcium 10.3 8.4 - 10.5 mg/dL  POCT glycosylated hemoglobin (Hb A1C)     Status: Abnormal   Collection Time: 12/16/19  3:17 PM  Result Value Ref Range   Hemoglobin A1C 8.2 (A) 4.0 - 5.6 %   HbA1c POC (<> result, manual entry) 8.2 4.0 - 5.6 %   HbA1c, POC (prediabetic range) 8.2 (A) 5.7 - 6.4 %   HbA1c, POC (controlled diabetic range) 8.2 (A) 0.0 - 7.0 %     A/P: In summary, Kristen Meyers is a 49 y.o. year old female with a past medical history of Covid-19 infection who presents with a 2 month history of "phantom smells". Physical exam unremarkable, normal neurological examination.  1) Olfactory dysfunction: 2 month history of "phantom" smells. Similar garlic-onion taste when patient had Covid  infection in august 2021. Physical exam unremarkable, neurological exam normal. Reassuring no fever, chills, history of head trauma, LOC. No recent infections. This is likely a sequelae of Covid-19 infection. Question as to whether medication induced. Only two new medications are atorvastatin and Eliquis. Patient must remain on Eliquis as benefits for DVT prophylaxis far outweigh olfactory dysfunction. Patient instructed that she may do a trial without atorvastatin to see if this will improve olfactory symptoms. - Continue to monitor. Will likely self-resolve. - Patient instructed if she develops neurologic symptoms,  headache, or systemic symptoms such as fever or chills to return to clinic for evaluation.  2) L leg DVT, "unprovoked" vs possibly secondary to covid 19 infection (DVT dx'd 2-3 wks after covid infection).  Left leg feeling back to normal since getting on eliquis! Her first DVT back in 2012 was in post-surgical setting (C section). Continue eliquis.  She knows what to watch for regarding eliquis side effects/risks. Plan repeat left leg venous doppler in 1 mo, d/c eliquis if neg.  3) DM 2, hx of poor control. Hba1c 12/2019: 8.2%. Medications changed at that time to better manage illness. Continue invokana 300 mg qd, continue glipizide xl 10mg  qd and metformin 1000 mg bid. Will recheck A1c at next visit (03/2020).  4) HTN: well controlled. BP today 95/59 with HR 99. Patient has had some coffee today, but no water. Instructed patient to stay well hydrated. Rechecked BP prior to conclusion of visit, was 106/70 at that time. Continue hctz 25 qd, toprol xl 200 qd, and enalapril 20 qd. Patient instructed to monitor BP tonight and tomorrow morning. If BP <161 systolic or <09 diastolic, patient instructed to not take BP medications (hctz, metoprolol, and enalapril).  5) HLD: Patient started on atorvastatin 20 mg qd. Patient denies myalgias. Unsure if related to acute olfactory dysfunction. This is unlikely, however, patient instructed she may trial without atorvastatin if she wished to see if olfactory symptoms improve.  Follow Up:  1 month olfactory dysfunction and routine chronic illnesses Signed: Nanetta Batty, MS3  I personally was present during the history, physical exam, and medical decision-making activities of this service and have verified that the service and findings are accurately documented in the student's note. Plan f/u chronic illnesses, check cmet, flp, and a1c in 1 mo, order rpt LE ven doppler u/s at that time.  Signed:  Crissie Sickles, MD           02/27/2020

## 2020-02-28 ENCOUNTER — Other Ambulatory Visit: Payer: Self-pay | Admitting: Family Medicine

## 2020-02-29 DIAGNOSIS — R1904 Left lower quadrant abdominal swelling, mass and lump: Secondary | ICD-10-CM | POA: Diagnosis not present

## 2020-02-29 DIAGNOSIS — N83201 Unspecified ovarian cyst, right side: Secondary | ICD-10-CM | POA: Diagnosis not present

## 2020-03-06 ENCOUNTER — Other Ambulatory Visit: Payer: Self-pay | Admitting: Family Medicine

## 2020-03-25 NOTE — Progress Notes (Deleted)
Virtual Visit via Video Note  I connected with pt on 03/25/20 at  9:00 AM EST by a video enabled telemedicine application and verified that I am speaking with the correct person using two identifiers.  Location patient: home, [place] Location provider:work or home office Persons participating in the virtual visit: patient, provider  I discussed the limitations of evaluation and management by telemedicine and the availability of in person appointments. The patient expressed understanding and agreed to proceed.   HPI: 50 y/o WF being seen today for 3 mo f/u DM 2, HTN, HLD, and acute DVT of L LE.  DVT: has been on eliquis for 3.5 months.  DM  HTN  HLD: tolerating atorva 20mg  qd. Due for rpt lipids.   ROS: See pertinent positives and negatives per HPI.  Past Medical History:  Diagnosis Date  . Acute DVT (deep venous thrombosis) (HCC)    DVT 2012-january, L leg, in the context of post-C section  . Cholelithiasis without obstruction 09/19/09 (u/s)  . COVID-19 virus infection    approx 11/09/19---got antibody infusion  . DEEP VENOUS THROMBOPHLEBITIS, HX OF 03/21/2010   Qualifier: Diagnosis of  By: Elease Hashimoto MD, Bruce    . Diabetes mellitus without complication ([place]) 06/15/6604  . Fatty liver 09/19/09 (u/s)   Mildly elevated transaminases  . GERD 03/21/2010  . History of adenomatous polyp of colon 12/2018   Recall 2023.  Marland Kitchen History of herpes zoster 03/2012  . HYPERLIPIDEMIA 03/21/2010   Intol of simva?  . HYPERTENSION 03/21/2010  . Obesity, Class II, BMI 35-39.9   . Seizures ([place])    as child- had 2 none since- no current treatment- never dx'd with any seizure d/o     Past Surgical History:  Procedure Laterality Date  . CESAREAN SECTION     2003, 2005  . COLONOSCOPY  01/05/2019   polyp ->recall 3 yrs  . LAPAROSCOPIC HYSTERECTOMY  02/2014   Ovaries remain (tubes out): done for DUB  . TUBAL LIGATION  2011   Dr. Reino Kent is her GYN.  . WISDOM TOOTH EXTRACTION       Current Outpatient  Medications:  .  apixaban (ELIQUIS) 5 MG TABS tablet, 1 tab po bid, Disp: 60 tablet, Rfl: 2 .  atorvastatin (LIPITOR) 20 MG tablet, TAKE 1 TABLET BY MOUTH EVERY DAY, Disp: 30 tablet, Rfl: 0 .  canagliflozin (INVOKANA) 300 MG TABS tablet, Take 1 tablet (300 mg total) by mouth daily before breakfast., Disp: 30 tablet, Rfl: 2 .  enalapril (VASOTEC) 20 MG tablet, TAKE 2 TABLETS BY MOUTH EVERY DAY, Disp: 60 tablet, Rfl: 5 .  glipiZIDE (GLUCOTROL XL) 10 MG 24 hr tablet, TAKE 1 TABLET (10 MG TOTAL) BY MOUTH DAILY WITH BREAKFAST., Disp: 30 tablet, Rfl: 2 .  hydrochlorothiazide (HYDRODIURIL) 25 MG tablet, Take 1 tablet (25 mg total) by mouth daily., Disp: 90 tablet, Rfl: 3 .  metFORMIN (GLUCOPHAGE) 1000 MG tablet, TAKE 1 TABLET (1,000 MG TOTAL) BY MOUTH 2 (TWO) TIMES DAILY WITH A MEAL., Disp: 60 tablet, Rfl: 6 .  metoprolol succinate (TOPROL-XL) 100 MG 24 hr tablet, TAKE 2 TABLETS BY MOUTH EVERY DAY, Disp: 180 tablet, Rfl: 0 .  Multiple Vitamins-Minerals (MULTIVITAMIN WITH MINERALS) tablet, Take 1 tablet by mouth daily., Disp: , Rfl:   EXAM:  VITALS per patient if applicable:  Vitals with BMI 02/27/2020 12/16/2019 12/01/2019  Height 5\' 3"  5\' 3"  5\' 3"   Weight 216 lbs 10 oz 218 lbs 6 oz 209 lbs 3 oz  BMI 38.38 38.7 37.07  Systolic 95 297 989  Diastolic 59 81 76  Pulse 99 91 85     GENERAL: alert, oriented, appears well and in no acute distress  HEENT: atraumatic, conjunttiva clear, no obvious abnormalities on inspection of external nose and ears  NECK: normal movements of the head and neck  LUNGS: on inspection no signs of respiratory distress, breathing rate appears normal, no obvious gross SOB, gasping or wheezing  CV: no obvious cyanosis  MS: moves all visible extremities without noticeable abnormality  PSYCH/NEURO: pleasant and cooperative, no obvious depression or anxiety, speech and thought processing grossly intact  LABS: none today    Chemistry      Component Value Date/Time    NA 139 12/01/2019 1128   K 4.6 12/01/2019 1128   CL 101 12/01/2019 1128   CO2 29 12/01/2019 1128   BUN 18 12/01/2019 1128   CREATININE 0.69 12/01/2019 1128   CREATININE 0.63 08/04/2014 1543      Component Value Date/Time   CALCIUM 10.3 12/01/2019 1128   ALKPHOS 67 12/01/2019 1128   AST 30 12/01/2019 1128   ALT 49 (H) 12/01/2019 1128   BILITOT 0.4 12/01/2019 1128     Lab Results  Component Value Date   WBC 5.5 12/01/2019   HGB 12.3 12/01/2019   HCT 37.4 12/01/2019   MCV 86.9 12/01/2019   PLT 326.0 12/01/2019   Lab Results  Component Value Date   HGBA1C 8.2 (A) 12/16/2019   HGBA1C 8.2 12/16/2019   HGBA1C 8.2 (A) 12/16/2019   HGBA1C 8.2 (A) 12/16/2019   Lab Results  Component Value Date   CHOL 260 (H) 05/09/2019   HDL 53.30 05/09/2019   LDLCALC 156 (H) 12/08/2018   LDLDIRECT 181.0 05/09/2019   TRIG 258.0 (H) 05/09/2019   CHOLHDL 5 05/09/2019   ASSESSMENT AND PLAN:  Discussed the following assessment and plan:  No diagnosis found.  Acute L LE DVT--suspect this was covid 19 infection-related.  Pt now s/p 3 mo course of eliquis. Rpt doppler ***. Hx of prior DVT in 2012, felt to be "provoked"->postsurgical.  -we discussed possible serious and likely etiologies, options for evaluation and workup, limitations of telemedicine visit vs in person visit, treatment, treatment risks and precautions. Pt prefers to treat via telemedicine empirically rather than in person at this moment.    F/u: ***  Signed:  Crissie Sickles, MD           03/25/2020

## 2020-03-26 ENCOUNTER — Telehealth: Payer: BC Managed Care – PPO | Admitting: Family Medicine

## 2020-03-26 DIAGNOSIS — E7800 Pure hypercholesterolemia, unspecified: Secondary | ICD-10-CM

## 2020-03-26 DIAGNOSIS — I82462 Acute embolism and thrombosis of left calf muscular vein: Secondary | ICD-10-CM

## 2020-03-26 DIAGNOSIS — I10 Essential (primary) hypertension: Secondary | ICD-10-CM

## 2020-03-26 DIAGNOSIS — E119 Type 2 diabetes mellitus without complications: Secondary | ICD-10-CM

## 2020-04-02 ENCOUNTER — Ambulatory Visit: Payer: BC Managed Care – PPO | Admitting: Family Medicine

## 2020-04-04 ENCOUNTER — Other Ambulatory Visit: Payer: Self-pay | Admitting: Family Medicine

## 2020-04-05 ENCOUNTER — Other Ambulatory Visit: Payer: Self-pay | Admitting: Family Medicine

## 2020-04-05 MED ORDER — CANAGLIFLOZIN 300 MG PO TABS
300.0000 mg | ORAL_TABLET | Freq: Every day | ORAL | 2 refills | Status: DC
Start: 2020-04-05 — End: 2020-07-25

## 2020-04-05 MED ORDER — APIXABAN 5 MG PO TABS: ORAL_TABLET | ORAL | 2 refills | Status: DC

## 2020-04-05 NOTE — Addendum Note (Signed)
Addended by: Octaviano Glow on: 04/05/2020 10:04 AM   Modules accepted: Orders

## 2020-04-10 ENCOUNTER — Other Ambulatory Visit: Payer: Self-pay

## 2020-04-11 ENCOUNTER — Encounter: Payer: Self-pay | Admitting: Family Medicine

## 2020-04-11 ENCOUNTER — Telehealth (INDEPENDENT_AMBULATORY_CARE_PROVIDER_SITE_OTHER): Payer: BC Managed Care – PPO | Admitting: Family Medicine

## 2020-04-11 ENCOUNTER — Telehealth: Payer: Self-pay

## 2020-04-11 VITALS — BP 128/97 | HR 96 | Wt 211.0 lb

## 2020-04-11 DIAGNOSIS — R7989 Other specified abnormal findings of blood chemistry: Secondary | ICD-10-CM

## 2020-04-11 DIAGNOSIS — I10 Essential (primary) hypertension: Secondary | ICD-10-CM

## 2020-04-11 DIAGNOSIS — E7800 Pure hypercholesterolemia, unspecified: Secondary | ICD-10-CM | POA: Diagnosis not present

## 2020-04-11 DIAGNOSIS — I82402 Acute embolism and thrombosis of unspecified deep veins of left lower extremity: Secondary | ICD-10-CM

## 2020-04-11 DIAGNOSIS — E119 Type 2 diabetes mellitus without complications: Secondary | ICD-10-CM

## 2020-04-11 NOTE — Telephone Encounter (Signed)
Dr.McGowen ordered a lower extremity venous doppler for Kristen Meyers earlier this month and she never got contacted about scheduling it. Preferred location is Product/process development scientist. Can one of you assist further?

## 2020-04-11 NOTE — Progress Notes (Signed)
Virtual Visit via Video Note  I connected with pt on 04/11/20 at  1:30 PM EST by a video enabled telemedicine application and verified that I am speaking with the correct person using two identifiers.  Location patient: home, [place] Location provider:work or home office Persons participating in the virtual visit: patient, provider  I discussed the limitations of evaluation and management by telemedicine and the availability of in person appointments. The patient expressed understanding and agreed to proceed.   HPI: 50 y/o WF being seen today for 6 week f/u L LE DVT, DM, HTN, and HLD. A/P as of last visit: "1) Olfactory dysfunction: 2 month history of "phantom" smells. Similar garlic-onion taste when patient had Covid infection in august 2021. Physical exam unremarkable, neurological exam normal. Reassuring no fever, chills, history of head trauma, LOC. No recent infections. This is likely a sequelae of Covid-19 infection. Question as to whether medication induced. Only two new medications are atorvastatin and Eliquis. Patient must remain on Eliquis as benefits for DVT prophylaxis far outweigh olfactory dysfunction. Patient instructed that she may do a trial without atorvastatin to see if this will improve olfactory symptoms. - Continue to monitor. Will likely self-resolve. - Patient instructed if she develops neurologic symptoms, headache, or systemic symptoms such as fever or chills to return to clinic for evaluation.  2) L leg DVT, "unprovoked" vs possibly secondary to covid 19 infection (DVT dx'd 2-3 wks after covid infection). Left leg feeling back to normal since getting on eliquis! Her first DVT back in 2012 was in post-surgical setting (C section). Continue eliquis. She knows what to watch for regarding eliquis side effects/risks. Plan repeat left leg venous doppler in 1 mo, d/c eliquis if neg.  3) DM 2, hx of poor control. Hba1c 12/2019: 8.2%. Medications changed at that time to better  manage illness. Continue invokana 300 mg qd, continue glipizide xl 10mg  qd and metformin 1000 mg bid. Will recheck A1c at next visit (03/2020).  4) HTN: well controlled. BP today 95/59 with HR 99. Patient has had some coffee today, but no water. Instructed patient to stay well hydrated. Rechecked BP prior to conclusion of visit, was 106/70 at that time. Continue hctz 25 qd, toprol xl 200 qd, and enalapril 20 qd. Patient instructed to monitor BP tonight and tomorrow morning. If BP <254 systolic or <27 diastolic, patient instructed to not take BP medications (hctz, metoprolol, and enalapril).  5) HLD: Patient started on atorvastatin 20 mg qd. Patient denies myalgias. Unsure if related to acute olfactory dysfunction. This is unlikely, however, patient instructed she may trial without atorvastatin if she wished to see if olfactory symptoms improve."  INTERIM HX: Doing well, no complaints. Smell back to normal!  She stopped atorva not long after last visit and just restarted it so we'll see if smell issue returns.  L leg w/out any pain or swelling. Has been compliant with eliquis x 3 full months and LE venous doppler repeat has been ordered but she says never got contacted.  HTN: no further low bp's back on all bp meds. 120s/90 avg.  DM: no home gluc monitoring data available today.   ROS: no fevers, no CP, no SOB, no wheezing, no cough, no dizziness, no HAs, no rashes, no melena/hematochezia.  No polyuria or polydipsia.  No myalgias or arthralgias.  No focal weakness, paresthesias, or tremors.  No acute vision or hearing abnormalities. No n/v/d or abd pain.  No palpitations.     Past Medical History:  Diagnosis  Date  . Acute DVT (deep venous thrombosis) (HCC)    DVT 2012-january, L leg, in the context of post-C section  . Cholelithiasis without obstruction 09/19/09 (u/s)  . COVID-19 virus infection    approx 11/09/19---got antibody infusion  . DEEP VENOUS THROMBOPHLEBITIS, HX OF  03/21/2010   Qualifier: Diagnosis of  By: Elease Hashimoto MD, [place]    . Diabetes mellitus without complication ([place]) 06/17/3293  . Fatty liver 09/19/09 (u/s)   Mildly elevated transaminases  . GERD 03/21/2010  . History of adenomatous polyp of colon 12/2018   Recall 2023.  Marland Kitchen History of herpes zoster 03/2012  . HYPERLIPIDEMIA 03/21/2010   Intol of simva?  . HYPERTENSION 03/21/2010  . Obesity, Class II, BMI 35-39.9   . Seizures ([place])    as child- had 2 none since- no current treatment- never dx'd with any seizure d/o     Past Surgical History:  Procedure Laterality Date  . CESAREAN SECTION     2003, 2005  . COLONOSCOPY  01/05/2019   polyp ->recall 3 yrs  . LAPAROSCOPIC HYSTERECTOMY  02/2014   Ovaries remain (tubes out): done for DUB  . TUBAL LIGATION  2011   Dr. Reino Kent is her GYN.  . WISDOM TOOTH EXTRACTION       Current Outpatient Medications:  .  apixaban (ELIQUIS) 5 MG TABS tablet, 1 tab po bid, Disp: 60 tablet, Rfl: 2 .  atorvastatin (LIPITOR) 20 MG tablet, TAKE 1 TABLET BY MOUTH EVERY DAY, Disp: 30 tablet, Rfl: 5 .  canagliflozin (INVOKANA) 300 MG TABS tablet, Take 1 tablet (300 mg total) by mouth daily before breakfast., Disp: 30 tablet, Rfl: 2 .  enalapril (VASOTEC) 20 MG tablet, TAKE 2 TABLETS BY MOUTH EVERY DAY, Disp: 60 tablet, Rfl: 5 .  glipiZIDE (GLUCOTROL XL) 10 MG 24 hr tablet, TAKE 1 TABLET (10 MG TOTAL) BY MOUTH DAILY WITH BREAKFAST., Disp: 30 tablet, Rfl: 2 .  hydrochlorothiazide (HYDRODIURIL) 25 MG tablet, Take 1 tablet (25 mg total) by mouth daily., Disp: 90 tablet, Rfl: 3 .  metFORMIN (GLUCOPHAGE) 1000 MG tablet, TAKE 1 TABLET (1,000 MG TOTAL) BY MOUTH 2 (TWO) TIMES DAILY WITH A MEAL., Disp: 60 tablet, Rfl: 6 .  metoprolol succinate (TOPROL-XL) 100 MG 24 hr tablet, TAKE 2 TABLETS BY MOUTH EVERY DAY, Disp: 180 tablet, Rfl: 0 .  Multiple Vitamins-Minerals (MULTIVITAMIN WITH MINERALS) tablet, Take 1 tablet by mouth daily., Disp: , Rfl:   EXAM:  VITALS per patient if  applicable:  Vitals with BMI 04/11/2020 02/27/2020 12/16/2019  Height - 5\' 3"  5\' 3"   Weight 211 lbs 216 lbs 10 oz 218 lbs 6 oz  BMI - 18.84 16.6  Systolic 063 95 016  Diastolic 97 59 81  Pulse 96 99 91     GENERAL: alert, oriented, appears well and in no acute distress  HEENT: atraumatic, conjunttiva clear, no obvious abnormalities on inspection of external nose and ears  NECK: normal movements of the head and neck  LUNGS: on inspection no signs of respiratory distress, breathing rate appears normal, no obvious gross SOB, gasping or wheezing  CV: no obvious cyanosis  MS: moves all visible extremities without noticeable abnormality  PSYCH/NEURO: pleasant and cooperative, no obvious depression or anxiety, speech and thought processing grossly intact  LABS: none today    Chemistry      Component Value Date/Time   NA 139 12/01/2019 1128   K 4.6 12/01/2019 1128   CL 101 12/01/2019 1128   CO2 29 12/01/2019 1128  BUN 18 12/01/2019 1128   CREATININE 0.69 12/01/2019 1128   CREATININE 0.63 08/04/2014 1543      Component Value Date/Time   CALCIUM 10.3 12/01/2019 1128   ALKPHOS 67 12/01/2019 1128   AST 30 12/01/2019 1128   ALT 49 (H) 12/01/2019 1128   BILITOT 0.4 12/01/2019 1128     Lab Results  Component Value Date   CHOL 260 (H) 05/09/2019   HDL 53.30 05/09/2019   LDLCALC 156 (H) 12/08/2018   LDLDIRECT 181.0 05/09/2019   TRIG 258.0 (H) 05/09/2019   CHOLHDL 5 05/09/2019   Lab Results  Component Value Date   WBC 5.5 12/01/2019   HGB 12.3 12/01/2019   HCT 37.4 12/01/2019   MCV 86.9 12/01/2019   PLT 326.0 12/01/2019   Lab Results  Component Value Date   TSH 5.32 (H) 05/09/2019   Lab Results  Component Value Date   HGBA1C 8.2 (A) 12/16/2019   HGBA1C 8.2 12/16/2019   HGBA1C 8.2 (A) 12/16/2019   HGBA1C 8.2 (A) 12/16/2019    ASSESSMENT AND PLAN:  Discussed the following assessment and plan:  1) Left LE DVT: likely secondary to covid infection back in Aug  2021. She has completed 3 mo of eliquis now. Completely asymptomatic now. Arranging rpt LE venous doppler and if neg then d/c eliquis is plan.  2) DM: a1c due. Future labs ordered. Cont invokana 300, glipizide xl 10, and metformin 1000 bid.  3) HTN: some lows occ w/out clear reason or pattern. Otherwise seems to consistently run diast 90s.  Systolics good. I'm going to hold off on any changes right now. Lytes/cr future.  4) HLD: took atorva 20 qd for several months before taking the last several weeks OFF this med to see if smell issues were secondary to it. She has now restarted it. FLP and hepatic panel future.   I discussed the assessment and treatment plan with the patient. The patient was provided an opportunity to ask questions and all were answered. The patient agreed with the plan and demonstrated an understanding of the instructions.   F/u: 3 mo  Signed:  Crissie Sickles, MD           04/11/2020

## 2020-04-11 NOTE — Telephone Encounter (Signed)
Order is found under imaging tab as current pending. Please let me know if you still need assistance

## 2020-04-11 NOTE — Telephone Encounter (Signed)
I have checked referrals, active requests and scheduled orders and I am unable to find this order. Please re-enter the order or help me where to find it. Thank you!

## 2020-04-12 NOTE — Telephone Encounter (Signed)
OK new ultrasound order is in.

## 2020-04-12 NOTE — Telephone Encounter (Signed)
Please re-enter order for requested imaging.

## 2020-04-12 NOTE — Telephone Encounter (Signed)
The order is still pending. He never signed it.

## 2020-04-12 NOTE — Addendum Note (Signed)
Addended by: Tammi Sou on: 04/12/2020 12:23 PM   Modules accepted: Orders

## 2020-04-12 NOTE — Telephone Encounter (Signed)
FYI

## 2020-04-16 ENCOUNTER — Ambulatory Visit (HOSPITAL_BASED_OUTPATIENT_CLINIC_OR_DEPARTMENT_OTHER)
Admission: RE | Admit: 2020-04-16 | Discharge: 2020-04-16 | Disposition: A | Discharge status: Home / Self Care | Payer: BC Managed Care – PPO | Source: Ambulatory Visit | Attending: Family Medicine | Admitting: Family Medicine

## 2020-04-16 ENCOUNTER — Other Ambulatory Visit: Payer: Self-pay

## 2020-04-16 DIAGNOSIS — I82432 Acute embolism and thrombosis of left popliteal vein: Secondary | ICD-10-CM | POA: Diagnosis not present

## 2020-04-16 DIAGNOSIS — I82402 Acute embolism and thrombosis of unspecified deep veins of left lower extremity: Secondary | ICD-10-CM | POA: Diagnosis not present

## 2020-04-17 ENCOUNTER — Ambulatory Visit (INDEPENDENT_AMBULATORY_CARE_PROVIDER_SITE_OTHER): Payer: BC Managed Care – PPO

## 2020-04-17 ENCOUNTER — Other Ambulatory Visit: Payer: Self-pay

## 2020-04-17 DIAGNOSIS — I10 Essential (primary) hypertension: Secondary | ICD-10-CM

## 2020-04-17 DIAGNOSIS — E119 Type 2 diabetes mellitus without complications: Secondary | ICD-10-CM

## 2020-04-17 DIAGNOSIS — E7800 Pure hypercholesterolemia, unspecified: Secondary | ICD-10-CM

## 2020-04-17 DIAGNOSIS — I959 Hypotension, unspecified: Secondary | ICD-10-CM

## 2020-04-17 DIAGNOSIS — R7989 Other specified abnormal findings of blood chemistry: Secondary | ICD-10-CM

## 2020-04-17 LAB — COMPREHENSIVE METABOLIC PANEL
ALT: 28 U/L (ref 0–35)
AST: 16 U/L (ref 0–37)
Albumin: 4.1 g/dL (ref 3.5–5.2)
Alkaline Phosphatase: 73 U/L (ref 39–117)
BUN: 25 mg/dL — ABNORMAL HIGH (ref 6–23)
CO2: 26 mEq/L (ref 19–32)
Calcium: 9.4 mg/dL (ref 8.4–10.5)
Chloride: 101 mEq/L (ref 96–112)
Creatinine, Ser: 0.8 mg/dL (ref 0.40–1.20)
GFR: 85.39 mL/min (ref >60.00)
Glucose, Bld: 215 mg/dL — ABNORMAL HIGH (ref 70–99)
Potassium: 4.1 mEq/L (ref 3.5–5.1)
Sodium: 134 mEq/L — ABNORMAL LOW (ref 135–145)
Total Bilirubin: 0.4 mg/dL (ref 0.2–1.2)
Total Protein: 6.4 g/dL (ref 6.0–8.3)

## 2020-04-17 LAB — LIPID PANEL
Cholesterol: 160 mg/dL (ref 0–200)
HDL: 39.4 mg/dL (ref >39.00)
LDL Cholesterol: 87 mg/dL (ref 0–99)
NonHDL: 120.4
Total CHOL/HDL Ratio: 4
Triglycerides: 165 mg/dL — ABNORMAL HIGH (ref 0.0–149.0)
VLDL: 33 mg/dL (ref 0.0–40.0)

## 2020-04-17 LAB — HEMOGLOBIN A1C: Hgb A1c MFr Bld: 9.1 % — ABNORMAL HIGH (ref 4.6–6.5)

## 2020-04-17 LAB — TSH: TSH: 5.37 u[IU]/mL — ABNORMAL HIGH (ref 0.35–4.50)

## 2020-04-18 ENCOUNTER — Other Ambulatory Visit: Payer: Self-pay

## 2020-04-18 DIAGNOSIS — E119 Type 2 diabetes mellitus without complications: Secondary | ICD-10-CM

## 2020-04-18 MED ORDER — INSULIN GLARGINE 100 UNITS/ML SOLOSTAR PEN: 10.0000 [IU] | PEN_INJECTOR | Freq: Every day | SUBCUTANEOUS | 1 refills | Status: DC

## 2020-04-26 ENCOUNTER — Encounter: Payer: Self-pay | Admitting: Family Medicine

## 2020-04-26 NOTE — Telephone Encounter (Signed)
Stop hydrochlorothiazide

## 2020-05-05 ENCOUNTER — Other Ambulatory Visit: Payer: Self-pay | Admitting: Family Medicine

## 2020-05-15 ENCOUNTER — Other Ambulatory Visit: Payer: Self-pay

## 2020-05-16 ENCOUNTER — Ambulatory Visit: Payer: BC Managed Care – PPO | Admitting: Family Medicine

## 2020-05-16 ENCOUNTER — Encounter: Payer: Self-pay | Admitting: Family Medicine

## 2020-05-16 VITALS — BP 112/75 | HR 79 | Temp 97.8°F | Resp 16 | Ht 63.0 in | Wt 222.2 lb

## 2020-05-16 DIAGNOSIS — E119 Type 2 diabetes mellitus without complications: Secondary | ICD-10-CM | POA: Diagnosis not present

## 2020-05-16 DIAGNOSIS — E7800 Pure hypercholesterolemia, unspecified: Secondary | ICD-10-CM | POA: Diagnosis not present

## 2020-05-16 DIAGNOSIS — I10 Essential (primary) hypertension: Secondary | ICD-10-CM

## 2020-05-16 NOTE — Progress Notes (Signed)
OFFICE VISIT  05/16/2020  CC:  Chief Complaint  Patient presents with  . Follow-up    DM 2, pt is fasting    HPI:    Patient is a 50 y.o. Caucasian female who presents for 5 week f/u DM 2. A/P as of last visit: "1) Left LE DVT: likely secondary to covid infection back in Aug 2021. She has completed 3 mo of eliquis now. Completely asymptomatic now. Arranging rpt LE venous doppler and if neg then d/c eliquis is plan.  2) DM: a1c due. Future labs ordered. Cont invokana 300, glipizide xl 10, and metformin 1000 bid.  3) HTN: some lows occ w/out clear reason or pattern. Otherwise seems to consistently run diast 90s.  Systolics good. I'm going to hold off on any changes right now. Lytes/cr future.  4) HLD: took atorva 20 qd for several months before taking the last several weeks OFF this med to see if smell issues were secondary to it. She has now restarted it. FLP and hepatic panel future."  INTERIM HX: Labs last visit showed a1c up to 9.1%-->added lantus 10 U qhs and continued all other meds. Pharmacy issue-->she never got lantus!  Our office was ever notified that there was any prob with this rx!  Over the last 1 mo  Her Glucoses <130 at all times of day as long as sticking with low carb/no processed foods diet.  If not on diet then more like 180s. She has increased her exercise.  Wt lifting and tennis.  HTN: not taking hctz (as per my instructions) and bp is good at home--consistently 110/70s. HR 70s-80s.  HLD: she did not restart the atorva. Taking naturopathic "beet supplement" instead. She is seeing a naturopathic MD at [place].  ROS: no fevers, no CP, no SOB, no wheezing, no cough, no HAs, no rashes, no melena/hematochezia.  No polyuria or polydipsia.  No myalgias or arthralgias.  No focal weakness, paresthesias, or tremors.  No acute vision or hearing abnormalities. No n/v/d or abd pain.  No palpitations.    Past Medical History:  Diagnosis Date  . Acute DVT  (deep venous thrombosis) (HCC)    DVT 2012-january, L leg, in the context of post-C section. Again 2021, L leg, dx'd shortly after having covid infection->took eliquis x 29mo.  Marland Kitchen Cholelithiasis without obstruction 09/19/09 (u/s)  . COVID-19 virus infection    approx 11/09/19---got antibody infusion  . Diabetes mellitus without complication ([place]) 11/19/6385  . Fatty liver 09/19/09 (u/s)   Mildly elevated transaminases  . GERD 03/21/2010  . History of adenomatous polyp of colon 12/2018   Recall 2023.  Marland Kitchen History of herpes zoster 03/2012  . HYPERLIPIDEMIA 03/21/2010   Intol of simva?  . HYPERTENSION 03/21/2010  . Obesity, Class II, BMI 35-39.9   . Seizures ([place])    as child- had 2 none since- no current treatment- never dx'd with any seizure d/o     Past Surgical History:  Procedure Laterality Date  . CESAREAN SECTION     2003, 2005  . COLONOSCOPY  01/05/2019   polyp ->recall 3 yrs  . LAPAROSCOPIC HYSTERECTOMY  02/2014   Ovaries remain (tubes out): done for DUB  . TUBAL LIGATION  2011   Dr. Reino Kent is her GYN.  . WISDOM TOOTH EXTRACTION      Outpatient Medications Prior to Visit  Medication Sig Dispense Refill  . atorvastatin (LIPITOR) 20 MG tablet TAKE 1 TABLET BY MOUTH EVERY DAY 30 tablet 5  . canagliflozin (INVOKANA)  300 MG TABS tablet Take 1 tablet (300 mg total) by mouth daily before breakfast. 30 tablet 2  . enalapril (VASOTEC) 20 MG tablet TAKE 2 TABLETS BY MOUTH EVERY DAY 180 tablet 1  . glipiZIDE (GLUCOTROL XL) 10 MG 24 hr tablet TAKE 1 TABLET (10 MG TOTAL) BY MOUTH DAILY WITH BREAKFAST. 30 tablet 2  . insulin glargine (LANTUS) 100 unit/mL SOPN Inject 10 Units into the skin daily. 15 mL 1  . metFORMIN (GLUCOPHAGE) 1000 MG tablet TAKE 1 TABLET (1,000 MG TOTAL) BY MOUTH 2 (TWO) TIMES DAILY WITH A MEAL. 60 tablet 6  . metoprolol succinate (TOPROL-XL) 100 MG 24 hr tablet TAKE 2 TABLETS BY MOUTH EVERY DAY 180 tablet 0  . hydrochlorothiazide (HYDRODIURIL) 25 MG tablet TAKE 1 TABLET BY MOUTH  EVERY DAY (Patient not taking: Reported on 05/16/2020) 90 tablet 1  . apixaban (ELIQUIS) 5 MG TABS tablet 1 tab po bid (Patient not taking: Reported on 05/16/2020) 60 tablet 2   No facility-administered medications prior to visit.    Allergies  Allergen Reactions  . Codeine Hives    ROS As per HPI  PE: Vitals with BMI 05/16/2020 04/11/2020 02/27/2020  Height 5\' 3"  - 5\' 3"   Weight 222 lbs 3 oz 211 lbs 216 lbs 10 oz  BMI 10.62 - 69.48  Systolic 546 270 95  Diastolic 75 97 59  Pulse 79 96 99   Gen: Alert, well appearing.  Patient is oriented to person, place, time, and situation. AFFECT: pleasant, lucid thought and speech. No further exam today.  LABS:  Lab Results  Component Value Date   TSH 5.37 (H) 04/17/2020   Lab Results  Component Value Date   WBC 5.5 12/01/2019   HGB 12.3 12/01/2019   HCT 37.4 12/01/2019   MCV 86.9 12/01/2019   PLT 326.0 12/01/2019   Lab Results  Component Value Date   CREATININE 0.80 04/17/2020   BUN 25 (H) 04/17/2020   NA 134 (L) 04/17/2020   K 4.1 04/17/2020   CL 101 04/17/2020   CO2 26 04/17/2020   Lab Results  Component Value Date   ALT 28 04/17/2020   AST 16 04/17/2020   ALKPHOS 73 04/17/2020   BILITOT 0.4 04/17/2020   Lab Results  Component Value Date   CHOL 160 04/17/2020   Lab Results  Component Value Date   HDL 39.40 04/17/2020   Lab Results  Component Value Date   LDLCALC 87 04/17/2020   Lab Results  Component Value Date   TRIG 165.0 (H) 04/17/2020   Lab Results  Component Value Date   CHOLHDL 4 04/17/2020   Lab Results  Component Value Date   HGBA1C 9.1 (H) 04/17/2020   IMPRESSION AND PLAN:  1) DM 2, does very well when she eats low carb diet, plus exercise is picking up more consistently. OK to hold off on the lantus start after all (pharmacy issue). Plan recheck a1c 2 mo--->stay on invokana 300 qd, metformin 1000 bid, and glipizide 10 qd.  2) HTN: very good bp's, no longer on hctz. Cont enalapril 20mg ,  2 tabs qd and toprol xl 100mg  2 tabs qd. Recheck lytes/cr 2 mo.  3) HLD: never restarted atorva-->stopped b/c she wondered if it was causing taste/smell issues.  She is seeing naturopathic MD through [place] (?) and prefers this approach for her HLD tx at this time, some kind of beet supplement. Plan recheck FLP 48mo.  An After Visit Summary was printed and given to the patient.  FOLLOW UP: Return in about 2 months (around 07/16/2020) for routine chronic illness f/u.  Signed:  Crissie Sickles, MD           05/16/2020

## 2020-05-18 DIAGNOSIS — R1904 Left lower quadrant abdominal swelling, mass and lump: Secondary | ICD-10-CM | POA: Diagnosis not present

## 2020-05-18 DIAGNOSIS — N83209 Unspecified ovarian cyst, unspecified side: Secondary | ICD-10-CM | POA: Diagnosis not present

## 2020-06-01 ENCOUNTER — Other Ambulatory Visit: Payer: Self-pay | Admitting: Family Medicine

## 2020-06-09 ENCOUNTER — Other Ambulatory Visit: Payer: Self-pay | Admitting: Family Medicine

## 2020-07-25 ENCOUNTER — Other Ambulatory Visit: Payer: Self-pay | Admitting: Family Medicine

## 2020-08-15 ENCOUNTER — Other Ambulatory Visit: Payer: Self-pay | Admitting: Family Medicine

## 2020-08-21 ENCOUNTER — Other Ambulatory Visit: Payer: Self-pay

## 2020-08-21 ENCOUNTER — Encounter: Payer: Self-pay | Admitting: Family Medicine

## 2020-08-21 DIAGNOSIS — I10 Essential (primary) hypertension: Secondary | ICD-10-CM

## 2020-08-21 DIAGNOSIS — E1169 Type 2 diabetes mellitus with other specified complication: Secondary | ICD-10-CM

## 2020-08-21 DIAGNOSIS — E7800 Pure hypercholesterolemia, unspecified: Secondary | ICD-10-CM

## 2020-08-21 DIAGNOSIS — K760 Fatty (change of) liver, not elsewhere classified: Secondary | ICD-10-CM

## 2020-08-21 MED ORDER — METOPROLOL SUCCINATE ER 100 MG PO TB24: 200.0000 mg | ORAL_TABLET | Freq: Every day | ORAL | 0 refills | Status: DC

## 2020-08-21 NOTE — Telephone Encounter (Signed)
Please advise 

## 2020-08-22 NOTE — Telephone Encounter (Signed)
OK for fasting labs prior to visit, orders are entered.

## 2020-08-24 ENCOUNTER — Other Ambulatory Visit: Payer: Self-pay | Admitting: Family Medicine

## 2020-08-25 ENCOUNTER — Other Ambulatory Visit: Payer: Self-pay | Admitting: Family Medicine

## 2020-09-15 ENCOUNTER — Other Ambulatory Visit: Payer: Self-pay | Admitting: Family Medicine

## 2020-09-21 ENCOUNTER — Ambulatory Visit: Payer: BC Managed Care – PPO

## 2020-09-24 ENCOUNTER — Ambulatory Visit (INDEPENDENT_AMBULATORY_CARE_PROVIDER_SITE_OTHER): Payer: BC Managed Care – PPO

## 2020-09-24 ENCOUNTER — Other Ambulatory Visit: Payer: Self-pay

## 2020-09-24 DIAGNOSIS — I10 Essential (primary) hypertension: Secondary | ICD-10-CM

## 2020-09-24 DIAGNOSIS — E7800 Pure hypercholesterolemia, unspecified: Secondary | ICD-10-CM

## 2020-09-24 DIAGNOSIS — K760 Fatty (change of) liver, not elsewhere classified: Secondary | ICD-10-CM

## 2020-09-24 DIAGNOSIS — E1169 Type 2 diabetes mellitus with other specified complication: Secondary | ICD-10-CM | POA: Diagnosis not present

## 2020-09-24 LAB — HEMOGLOBIN A1C: Hgb A1c MFr Bld: 9 % — ABNORMAL HIGH (ref 4.6–6.5)

## 2020-09-24 LAB — COMPREHENSIVE METABOLIC PANEL
ALT: 28 U/L (ref 0–35)
AST: 19 U/L (ref 0–37)
Albumin: 4.4 g/dL (ref 3.5–5.2)
Alkaline Phosphatase: 60 U/L (ref 39–117)
BUN: 19 mg/dL (ref 6–23)
CO2: 25 mEq/L (ref 19–32)
Calcium: 9.4 mg/dL (ref 8.4–10.5)
Chloride: 99 mEq/L (ref 96–112)
Creatinine, Ser: 0.7 mg/dL (ref 0.40–1.20)
GFR: 99.92 mL/min (ref >60.00)
Glucose, Bld: 143 mg/dL — ABNORMAL HIGH (ref 70–99)
Potassium: 4.3 mEq/L (ref 3.5–5.1)
Sodium: 135 mEq/L (ref 135–145)
Total Bilirubin: 0.6 mg/dL (ref 0.2–1.2)
Total Protein: 6.7 g/dL (ref 6.0–8.3)

## 2020-09-24 LAB — CBC WITH DIFFERENTIAL/PLATELET
Basophils Absolute: 0.1 10*3/uL (ref 0.0–0.1)
Basophils Relative: 1.3 % (ref 0.0–3.0)
Eosinophils Absolute: 0.3 10*3/uL (ref 0.0–0.7)
Eosinophils Relative: 3.3 % (ref 0.0–5.0)
HCT: 42.3 % (ref 36.0–46.0)
Hemoglobin: 14.1 g/dL (ref 12.0–15.0)
Lymphocytes Relative: 28 % (ref 12.0–46.0)
Lymphs Abs: 2.2 10*3/uL (ref 0.7–4.0)
MCHC: 33.3 g/dL (ref 30.0–36.0)
MCV: 86.7 fl (ref 78.0–100.0)
Monocytes Absolute: 0.6 10*3/uL (ref 0.1–1.0)
Monocytes Relative: 7.3 % (ref 3.0–12.0)
Neutro Abs: 4.7 10*3/uL (ref 1.4–7.7)
Neutrophils Relative %: 60.1 % (ref 43.0–77.0)
Platelets: 242 10*3/uL (ref 150.0–400.0)
RBC: 4.88 Mil/uL (ref 3.87–5.11)
RDW: 15.1 % (ref 11.5–15.5)
WBC: 7.9 10*3/uL (ref 4.0–10.5)

## 2020-09-24 LAB — LIPID PANEL
Cholesterol: 215 mg/dL — ABNORMAL HIGH (ref 0–200)
HDL: 54.9 mg/dL (ref >39.00)
NonHDL: 160.13
Total CHOL/HDL Ratio: 4
Triglycerides: 210 mg/dL — ABNORMAL HIGH (ref 0.0–149.0)
VLDL: 42 mg/dL — ABNORMAL HIGH (ref 0.0–40.0)

## 2020-09-24 LAB — T4, FREE: Free T4: 0.64 ng/dL (ref 0.60–1.60)

## 2020-09-24 LAB — MICROALBUMIN / CREATININE URINE RATIO
Creatinine,U: 66.8 mg/dL
Microalb Creat Ratio: 1 mg/g (ref 0.0–30.0)
Microalb, Ur: <0.7 mg/dL (ref 0.0–1.9)

## 2020-09-24 LAB — LDL CHOLESTEROL, DIRECT: Direct LDL: 140 mg/dL

## 2020-09-24 LAB — TSH: TSH: 6.2 u[IU]/mL — ABNORMAL HIGH (ref 0.35–5.50)

## 2020-09-25 ENCOUNTER — Other Ambulatory Visit: Payer: Self-pay

## 2020-09-25 LAB — T3: T3, Total: 111 ng/dL (ref 76–181)

## 2020-09-26 ENCOUNTER — Encounter: Payer: Self-pay | Admitting: Family Medicine

## 2020-09-26 ENCOUNTER — Ambulatory Visit: Payer: BC Managed Care – PPO | Admitting: Family Medicine

## 2020-09-26 ENCOUNTER — Other Ambulatory Visit: Payer: Self-pay

## 2020-09-26 VITALS — BP 120/76 | HR 69 | Temp 98.0°F | Resp 16 | Ht 63.0 in | Wt 226.0 lb

## 2020-09-26 DIAGNOSIS — R002 Palpitations: Secondary | ICD-10-CM | POA: Diagnosis not present

## 2020-09-26 DIAGNOSIS — E119 Type 2 diabetes mellitus without complications: Secondary | ICD-10-CM | POA: Diagnosis not present

## 2020-09-26 DIAGNOSIS — I10 Essential (primary) hypertension: Secondary | ICD-10-CM | POA: Diagnosis not present

## 2020-09-26 DIAGNOSIS — E7800 Pure hypercholesterolemia, unspecified: Secondary | ICD-10-CM | POA: Diagnosis not present

## 2020-09-26 MED ORDER — OZEMPIC (0.25 OR 0.5 MG/DOSE) 2 MG/1.5ML ~~LOC~~ SOPN: PEN_INJECTOR | SUBCUTANEOUS | 3 refills | Status: DC

## 2020-09-26 NOTE — Patient Instructions (Signed)
I sent in rx for ozempic injection.  Instructions will say 0.5mg  injection every 7 days, but I want you to take only 0.25mg  weekly for the first 4 injections.

## 2020-09-26 NOTE — Progress Notes (Signed)
OFFICE VISIT  09/26/2020  CC:  Chief Complaint  Patient presents with   Follow-up    RCI, pt is not fasting   HPI:    Patient is a 50 y.o. Caucasian female who presents for 4 mo f/u DM, HTN, HLD. A/P as of last visit: "1) DM 2, does very well when she eats low carb diet, plus exercise is picking up more consistently. OK to hold off on the lantus start after all (pharmacy issue). Plan recheck a1c 2 mo--->stay on invokana 300 qd, metformin 1000 bid, and glipizide 10 qd.   2) HTN: very good bp's, no longer on hctz. Cont enalapril 20mg , 2 tabs qd and toprol xl 100mg  2 tabs qd. Recheck lytes/cr 2 mo.   3) HLD: never restarted atorva-->stopped b/c she wondered if it was causing taste/smell issues.  She is seeing naturopathic MD through [place] (?) and prefers this approach for her HLD tx at this time, some kind of beet supplement. Plan recheck FLP 83mo."  INTERIM HX: Overall feeling well.  However, she notes some intermittent feeling of palpitations, more intense when laying down on L side.  Pretty much daily lately.  Mild lightheaded feeling, definitely worse with caffeine.  No chest pain, sob, or presyncope. Checks HR during sx's and notes normal HR (60s-70s). Sxs typically last 10-15 minutes, longest 30 min.  Feels it some right now.   Reviewed recent labs from 09/24/20 in detail with pt today (see labs section below).  DM: a1c 2 d/a was 9.0 %, same as 4 mo ago.  She has remained on invokana 300 qd, metformin 1000 mg bid, and glipizide xl 10 qd.  HLD: on 09/24/20 her LDL was 87, HDL 55, trigs 210.  HTN:  enalapril 20mg , 2 tabs qd and toprol xl 100mg  2 tabs qd. Lytes/cr normal on 09/24/20. Occ home bp check 115/70 avg.    ROS as above, plus--> no fevers, no CP, no SOB, no wheezing, no cough, no HAs, no rashes, no melena/hematochezia.  No polyuria or polydipsia.  No myalgias or arthralgias.  No focal weakness, paresthesias, or tremors.  No acute vision or hearing abnormalities.   No dysuria or unusual/new urinary urgency or frequency.  No recent changes in lower legs. No n/v/d or abd pain.    Past Medical History:  Diagnosis Date   Acute DVT (deep venous thrombosis) (HCC)    DVT 2012-january, L leg, in the context of post-C section. Again 2021, L leg, dx'd shortly after having covid infection->took eliquis x 74mo.   Cholelithiasis without obstruction 09/19/09 (u/s)   COVID-19 virus infection    approx 11/09/19---got antibody infusion   Diabetes mellitus without complication ([place]) 14/43/1540   Fatty liver 09/19/09 (u/s)   Mildly elevated transaminases   GERD 03/21/2010   History of adenomatous polyp of colon 12/2018   Recall 2023.   History of herpes zoster 03/2012   HYPERLIPIDEMIA 03/21/2010   Intol of simva?   HYPERTENSION 03/21/2010   Obesity, Class II, BMI 35-39.9    Seizures ([place])    as child- had 2 none since- no current treatment- never dx'd with any seizure d/o    Subclinical hypothyroidism 2021    Past Surgical History:  Procedure Laterality Date   CESAREAN SECTION     2003, 2005   COLONOSCOPY  01/05/2019   polyp ->recall 3 yrs   LAPAROSCOPIC HYSTERECTOMY  02/2014   Ovaries remain (tubes out): done for DUB   TUBAL LIGATION  2011   Dr. Reino Kent  is her GYN.   WISDOM TOOTH EXTRACTION      Outpatient Medications Prior to Visit  Medication Sig Dispense Refill   enalapril (VASOTEC) 20 MG tablet TAKE 2 TABLETS BY MOUTH EVERY DAY 180 tablet 1   glipiZIDE (GLUCOTROL XL) 10 MG 24 hr tablet TAKE 1 TABLET (10 MG TOTAL) BY MOUTH DAILY WITH BREAKFAST. 30 tablet 2   INVOKANA 300 MG TABS tablet TAKE 1 TABLET (300 MG TOTAL) BY MOUTH DAILY BEFORE BREAKFAST. 30 tablet 0   metFORMIN (GLUCOPHAGE) 1000 MG tablet TAKE 1 TABLET (1,000 MG TOTAL) BY MOUTH 2 (TWO) TIMES DAILY WITH A MEAL. 60 tablet 0   metoprolol succinate (TOPROL-XL) 100 MG 24 hr tablet Take 2 tablets (200 mg total) by mouth daily. Take with or immediately following a meal. 60 tablet 0   No  facility-administered medications prior to visit.    Allergies  Allergen Reactions   Codeine Hives    ROS As per HPI  PE: Vitals with BMI 09/26/2020 05/16/2020 04/11/2020  Height 5\' 3"  5\' 3"  -  Weight 226 lbs 222 lbs 3 oz 211 lbs  BMI 24.26 83.41 -  Systolic 962 229 798  Diastolic 76 75 97  Pulse 69 79 96   Gen: Alert, well appearing.  Patient is oriented to person, place, time, and situation. AFFECT: pleasant, lucid thought and speech. CV: Regular for the most part, occ brief premature beat every 15 beats or so-- no m/r/g.   LUNGS: CTA bilat, nonlabored resps, good aeration in all lung fields. EXT: no clubbing or cyanosis.  no edema.    LABS:  Lab Results  Component Value Date   TSH 6.20 (H) 09/24/2020   T3TOTAL 111 09/24/2020  Free T4 was 0.64 on 09/24/20 (WNL)  Lab Results  Component Value Date   WBC 7.9 09/24/2020   HGB 14.1 09/24/2020   HCT 42.3 09/24/2020   MCV 86.7 09/24/2020   PLT 242.0 09/24/2020   Lab Results  Component Value Date   CREATININE 0.70 09/24/2020   BUN 19 09/24/2020   NA 135 09/24/2020   K 4.3 09/24/2020   CL 99 09/24/2020   CO2 25 09/24/2020   Lab Results  Component Value Date   ALT 28 09/24/2020   AST 19 09/24/2020   ALKPHOS 60 09/24/2020   BILITOT 0.6 09/24/2020   Lab Results  Component Value Date   CHOL 215 (H) 09/24/2020   Lab Results  Component Value Date   HDL 54.90 09/24/2020   Lab Results  Component Value Date   LDLCALC 87 04/17/2020   Lab Results  Component Value Date   TRIG 210.0 (H) 09/24/2020   Lab Results  Component Value Date   CHOLHDL 4 09/24/2020   Lab Results  Component Value Date   HGBA1C 9.0 (H) 09/24/2020   12 lead EKG today: sinus rhythm with one brief pause captured on initial print-off.  No pause on subsequent printing/capture printed, PR interval normal, no PACs or PVCs, normal QRS duration, and normal QT interval.  Poor R wave progression.  No ST segment abnormalities, no Q waves (no prior  ekg for comparison).  IMPRESSION AND PLAN:  1) Palpitations, correlating well with her caffeine intake. EKG with brief pause, poor R wave progression.  Discussed the fact that these ekg findings are nonspecific but certainly I presented the option of outpt rhythm monitoring or cardiology referral.  She chose to start first with gradually cutting back caffeine intake to see how her sx's went.  Call  if worsening/persisting despite cutting back on caffeine.  We'll see how she is doing with this in 1 mo.  2) DM 2, poor control. Hba1c still up->9%. Add ozempic: 0.25mg  q7d x 4 wks then increase to 0.5mg  sq q week.  Continue full dose metformin, glipizide, and invokana.  Urine microalb/cr neg 2 d/a.  3) HTN: good control, continue toprol xl 200 qd and enalapril 40 mg qd. Lytes/cr normal 2 d/a.  4) HLD: LDL 87.  Technically her goal is <70 but held off on statin increase at this time.  An After Visit Summary was printed and given to the patient.  FOLLOW UP: Return in about 4 weeks (around 10/24/2020) for f/u palpitations. Next Cpe 3-4 mo  Signed:  Crissie Sickles, MD           09/26/2020

## 2020-10-03 DIAGNOSIS — Z7984 Long term (current) use of oral hypoglycemic drugs: Secondary | ICD-10-CM | POA: Diagnosis not present

## 2020-10-03 DIAGNOSIS — D3132 Benign neoplasm of left choroid: Secondary | ICD-10-CM | POA: Diagnosis not present

## 2020-10-03 DIAGNOSIS — E119 Type 2 diabetes mellitus without complications: Secondary | ICD-10-CM | POA: Diagnosis not present

## 2020-10-03 LAB — HM DIABETES EYE EXAM

## 2020-10-04 ENCOUNTER — Other Ambulatory Visit: Payer: Self-pay | Admitting: Family Medicine

## 2020-10-24 ENCOUNTER — Ambulatory Visit: Payer: BC Managed Care – PPO | Admitting: Family Medicine

## 2020-10-24 ENCOUNTER — Encounter: Payer: Self-pay | Admitting: Family Medicine

## 2020-10-24 ENCOUNTER — Other Ambulatory Visit: Payer: Self-pay

## 2020-10-24 VITALS — BP 106/73 | HR 77 | Temp 97.8°F | Resp 16 | Ht 63.0 in | Wt 219.4 lb

## 2020-10-24 DIAGNOSIS — R002 Palpitations: Secondary | ICD-10-CM

## 2020-10-24 DIAGNOSIS — E119 Type 2 diabetes mellitus without complications: Secondary | ICD-10-CM

## 2020-10-24 MED ORDER — METOPROLOL SUCCINATE ER 100 MG PO TB24: 200.0000 mg | ORAL_TABLET | Freq: Every day | ORAL | 3 refills | Status: DC

## 2020-10-24 MED ORDER — METFORMIN HCL 1000 MG PO TABS: 1000.0000 mg | ORAL_TABLET | Freq: Two times a day (BID) | ORAL | 3 refills | Status: DC

## 2020-10-24 MED ORDER — CANAGLIFLOZIN 300 MG PO TABS: 300.0000 mg | ORAL_TABLET | Freq: Every day | ORAL | 3 refills | Status: DC

## 2020-10-24 MED ORDER — ENALAPRIL MALEATE 20 MG PO TABS: 40.0000 mg | ORAL_TABLET | Freq: Every day | ORAL | 3 refills | Status: DC

## 2020-10-24 NOTE — Progress Notes (Signed)
OFFICE VISIT  10/24/2020  CC:  Chief Complaint  Patient presents with   Follow-up    Palpitations   HPI:    Patient is a 50 y.o. Caucasian female who presents for 1 mo f/u palpitations. A/P as of last visit: "1) Palpitations, correlating well with her caffeine intake. EKG with brief pause, poor R wave progression.  Discussed the fact that these ekg findings are nonspecific but certainly I presented the option of outpt rhythm monitoring or cardiology referral.  She chose to start first with gradually cutting back caffeine intake to see how her sx's went.  Call if worsening/persisting despite cutting back on caffeine.  We'll see how she is doing with this in 1 mo.   2) DM 2, poor control. Hba1c still up->9%. Add ozempic: 0.'25mg'$  q7d x 4 wks then increase to 0.'5mg'$  sq q week.  Continue full dose metformin, glipizide, and invokana. Urine microalb/cr neg 2 d/a.   3) HTN: good control, continue toprol xl 200 qd and enalapril 40 mg qd. Lytes/cr normal 2 d/a.   4) HLD: LDL 87.  Technically her goal is <70 but held off on statin increase at this time."  INTERIM HX: Doing better with cutting back on caffeine--8 oz of half-caf qAM weekdays, 8 oz full caf weekends. Just has some brief spells of feeling inc sensation of heartbeat/inc HR at night in bed.  No dizziness, CP.  Nothing prolonged.  Gluocoses better since getting on semaglutide (around 125-150 range fasting) and she is set to start the 0.'5mg'$  SQ q week dose today. Has some nausea but not too bad.  The med has helped with dieting ---her appetite is decreased and she trying intermittent fasting lately.    Past Medical History:  Diagnosis Date   Acute DVT (deep venous thrombosis) (HCC)    DVT 2012-january, L leg, in the context of post-C section. Again 2021, L leg, dx'd shortly after having covid infection->took eliquis x 55mo   Cholelithiasis without obstruction 09/19/09 (u/s)   COVID-19 virus infection    approx 11/09/19---got  antibody infusion   Diabetes mellitus without complication ([facility] [id number]  Fatty liver 09/19/09 (u/s)   Mildly elevated transaminases   GERD 03/21/2010   History of adenomatous polyp of colon 12/2018   Recall 2023.   History of herpes zoster 03/2012   HYPERLIPIDEMIA 03/21/2010   Intol of simva?   HYPERTENSION 03/21/2010   Obesity, Class II, BMI 35-39.9    Seizures ([facility]    as child- had 2 none since- no current treatment- never dx'd with any seizure d/o    Subclinical hypothyroidism 2021    Past Surgical History:  Procedure Laterality Date   CESAREAN SECTION     2003, 2005   COLONOSCOPY  01/05/2019   polyp ->recall 3 yrs   LAPAROSCOPIC HYSTERECTOMY  02/2014   Ovaries remain (tubes out): done for DUB   TUBAL LIGATION  2011   Dr. GReino Kentis her GYN.   WISDOM TOOTH EXTRACTION      Outpatient Medications Prior to Visit  Medication Sig Dispense Refill   glipiZIDE (GLUCOTROL XL) 10 MG 24 hr tablet TAKE 1 TABLET (10 MG TOTAL) BY MOUTH DAILY WITH BREAKFAST. 30 tablet 2   Semaglutide,0.25 or 0.'5MG'$ /DOS, (OZEMPIC, 0.25 OR 0.5 MG/DOSE,) 2 MG/1.5ML SOPN 0.'5mg'$  SQ q week 6 mL 3   enalapril (VASOTEC) 20 MG tablet TAKE 2 TABLETS BY MOUTH EVERY DAY 180 tablet 1   INVOKANA 300 MG TABS tablet TAKE 1 TABLET (300 MG TOTAL)  BY MOUTH DAILY BEFORE BREAKFAST. 30 tablet 0   metFORMIN (GLUCOPHAGE) 1000 MG tablet TAKE 1 TABLET (1,000 MG TOTAL) BY MOUTH 2 (TWO) TIMES DAILY WITH A MEAL. 60 tablet 0   metoprolol succinate (TOPROL-XL) 100 MG 24 hr tablet TAKE 2 TABLETS (200 MG TOTAL) BY MOUTH DAILY. TAKE WITH OR IMMEDIATELY FOLLOWING A MEAL. 60 tablet 0   No facility-administered medications prior to visit.    Allergies  Allergen Reactions   Codeine Hives    ROS As per HPI  PE: Vitals with BMI 10/24/2020 09/26/2020 05/16/2020  Height '5\' 3"'$  '5\' 3"'$  '5\' 3"'$   Weight 219 lbs 6 oz 226 lbs 222 lbs 3 oz  BMI 38.87 [id number]  Systolic [id number]  Diastolic 73 76 75  Pulse 77 69 79     Gen: Alert, well  appearing.  Patient is oriented to person, place, time, and situation. AFFECT: pleasant, lucid thought and speech. No further exam today.  LABS:  Lab Results  Component Value Date   TSH 6.20 (H) 09/24/2020  Free T4 and T3 total NORMAL on 09/24/20  Lab Results  Component Value Date   WBC 7.9 09/24/2020   HGB 14.1 09/24/2020   HCT 42.3 09/24/2020   MCV 86.7 09/24/2020   PLT 242.0 09/24/2020   Lab Results  Component Value Date   CREATININE 0.70 09/24/2020   BUN 19 09/24/2020   NA 135 09/24/2020   K 4.3 09/24/2020   CL 99 09/24/2020   CO2 25 09/24/2020   Lab Results  Component Value Date   ALT 28 09/24/2020   AST 19 09/24/2020   ALKPHOS 60 09/24/2020   BILITOT 0.6 09/24/2020   Lab Results  Component Value Date   CHOL 215 (H) 09/24/2020   Lab Results  Component Value Date   HDL 54.90 09/24/2020   Lab Results  Component Value Date   LDLCALC 87 04/17/2020   Lab Results  Component Value Date   TRIG 210.0 (H) 09/24/2020   Lab Results  Component Value Date   CHOLHDL 4 09/24/2020   Lab Results  Component Value Date   HGBA1C 9.0 (H) 09/24/2020   IMPRESSION AND PLAN:  1) Palpitations: suspect PACs/sinus tach d/t caffeine. She's doing much better with signif dec in caffeine intake.  2) DM 2, poor control but improving since getting on ozempic 1 mo ago Moving up to 0.'5mg'$  q week dosing today. Discussed possibility of inc in her nausea but should dissipate as the is on this dose for a while. She expressed understanding.  An After Visit Summary was printed and given to the patient.  FOLLOW UP: Return in about 2 months (around 12/24/2020) for routine chronic illness f/u.  Signed:  Crissie Sickles, MD           10/24/2020

## 2020-11-13 ENCOUNTER — Other Ambulatory Visit: Payer: Self-pay | Admitting: Family Medicine

## 2020-11-13 ENCOUNTER — Telehealth: Payer: Self-pay

## 2020-11-13 NOTE — Telephone Encounter (Signed)
PA sent via covermymed on 11/13/20   Key: [id number]   Medication: Invokana '300MG'$    Dx: E11.9   Per Dr. Anitra Lauth pt has tried and failed metformin '500mg'$ , pioglitazone 15 & '30mg'$ , januvia '100mg'$ .   Waiting for response.

## 2020-11-13 NOTE — Telephone Encounter (Signed)
Currently working on completing PA thru [facility]

## 2020-11-13 NOTE — Telephone Encounter (Signed)
Patient was contacted by pharmacy to let her know, prior Kristen Meyers is required on the following med: Patient request to start PA process if not already done so.  Patient can be reached at [phone].  canagliflozin (INVOKANA) 300 MG TABS tablet [id number]   [facility]

## 2020-11-14 NOTE — Telephone Encounter (Signed)
Still waiting for determination on PA

## 2020-11-15 MED ORDER — DAPAGLIFLOZIN PROPANEDIOL 10 MG PO TABS: 10.0000 mg | ORAL_TABLET | Freq: Every day | ORAL | 3 refills | Status: DC

## 2020-11-15 NOTE — Addendum Note (Signed)
Addended by: Deveron Furlong D on: 11/15/2020 11:07 AM   Modules accepted: Orders

## 2020-11-15 NOTE — Telephone Encounter (Signed)
Rx sent, pt was made aware of med change due to coverage. According to covermymeds PA website: Drug is covered by current benefit plan. No further PA activity needed

## 2020-11-15 NOTE — Telephone Encounter (Signed)
PA denied; coverage is provided in situations where the patient has tried both preferred formulary alternatives: [place] and [place]. Coverage cannot be authorized at this time. Sent to PCP via telephone encounter for alternative options

## 2020-11-15 NOTE — Telephone Encounter (Signed)
OK. Pls eRx farxiga '10mg'$ , 1 tab po qd, #90, RF x 3.-thx

## 2020-11-15 NOTE — Telephone Encounter (Addendum)
PA denied because patient has not tried both preferred formulary alternatives: [place] and [place]. Coverage cannot be authorized at this time. Insurance wants farxiga

## 2020-11-18 ENCOUNTER — Other Ambulatory Visit: Payer: Self-pay | Admitting: Family Medicine

## 2020-12-25 ENCOUNTER — Encounter: Payer: Self-pay | Admitting: Family Medicine

## 2021-01-02 ENCOUNTER — Ambulatory Visit (INDEPENDENT_AMBULATORY_CARE_PROVIDER_SITE_OTHER): Payer: BC Managed Care – PPO | Admitting: Family Medicine

## 2021-01-02 ENCOUNTER — Other Ambulatory Visit: Payer: Self-pay

## 2021-01-02 ENCOUNTER — Encounter: Payer: Self-pay | Admitting: Family Medicine

## 2021-01-02 VITALS — BP 111/77 | HR 69 | Temp 97.8°F | Resp 16 | Ht 64.5 in | Wt 220.4 lb

## 2021-01-02 DIAGNOSIS — I10 Essential (primary) hypertension: Secondary | ICD-10-CM

## 2021-01-02 DIAGNOSIS — Z1211 Encounter for screening for malignant neoplasm of colon: Secondary | ICD-10-CM

## 2021-01-02 DIAGNOSIS — E038 Other specified hypothyroidism: Secondary | ICD-10-CM

## 2021-01-02 DIAGNOSIS — Z0000 Encounter for general adult medical examination without abnormal findings: Secondary | ICD-10-CM | POA: Diagnosis not present

## 2021-01-02 DIAGNOSIS — E119 Type 2 diabetes mellitus without complications: Secondary | ICD-10-CM | POA: Diagnosis not present

## 2021-01-02 DIAGNOSIS — E7800 Pure hypercholesterolemia, unspecified: Secondary | ICD-10-CM

## 2021-01-02 DIAGNOSIS — E039 Hypothyroidism, unspecified: Secondary | ICD-10-CM

## 2021-01-02 DIAGNOSIS — Z1231 Encounter for screening mammogram for malignant neoplasm of breast: Secondary | ICD-10-CM

## 2021-01-02 LAB — T4, FREE: Free T4: 0.69 ng/dL (ref 0.60–1.60)

## 2021-01-02 LAB — LIPID PANEL
Cholesterol: 230 mg/dL — ABNORMAL HIGH (ref 0–200)
HDL: 45.2 mg/dL (ref >39.00)
LDL Cholesterol: 151 mg/dL — ABNORMAL HIGH (ref 0–99)
NonHDL: 184.67
Total CHOL/HDL Ratio: 5
Triglycerides: 170 mg/dL — ABNORMAL HIGH (ref 0.0–149.0)
VLDL: 34 mg/dL (ref 0.0–40.0)

## 2021-01-02 LAB — BASIC METABOLIC PANEL
BUN: 22 mg/dL (ref 6–23)
CO2: 26 mEq/L (ref 19–32)
Calcium: 9.1 mg/dL (ref 8.4–10.5)
Chloride: 103 mEq/L (ref 96–112)
Creatinine, Ser: 0.72 mg/dL (ref 0.40–1.20)
GFR: 96.42 mL/min (ref >60.00)
Glucose, Bld: 202 mg/dL — ABNORMAL HIGH (ref 70–99)
Potassium: 4.3 mEq/L (ref 3.5–5.1)
Sodium: 138 mEq/L (ref 135–145)

## 2021-01-02 LAB — TSH: TSH: 3.12 u[IU]/mL (ref 0.35–5.50)

## 2021-01-02 LAB — HEMOGLOBIN A1C: Hgb A1c MFr Bld: 8.2 % — ABNORMAL HIGH (ref 4.6–6.5)

## 2021-01-02 NOTE — Progress Notes (Signed)
Office Note 01/02/2021  CC:  Chief Complaint  Patient presents with   Annual Exam    Pt is fasting, declined all vaccines today( shingrix and flu)    HPI:  Patient is a 50 y.o. female who is here for annual health maintenance exam and f/u DM, HTN, and HLD. A/P as of last visit 2 mo ago: "1) Palpitations: suspect PACs/sinus tach d/t caffeine. She's doing much better with signif dec in caffeine intake.   2) DM 2, poor control but improving since getting on ozempic 1 mo ago Moving up to 0.5mg  q week dosing today. Discussed possibility of inc in her nausea but should dissipate as the is on this dose for a while. She expressed understanding."  INTERIM HX: Feeling well.  DM: insurer now won't cover invokana or ozempic anymore.  Last took this about a month ago. Was having some back pain and burning with urination while on ozempic, stopped when d/c'd med. Glucoses were "pretty good" when checking regularly a couple months ago-->160s highest, many 90s-130s.  ?some excessive thirst but not unquenchable, worse hs. Feet: no burning, tingling or numbness.  Home bp monitoring: always <130/80.  Hx of muscle cramps on simvastatin.   Past Medical History:  Diagnosis Date   Acute DVT (deep venous thrombosis) (HCC)    DVT 2012-january, L leg, in the context of post-C section. Again 2021, L leg, dx'd shortly after having covid infection->took eliquis x 85mo.   Cholelithiasis without obstruction 09/19/09 (u/s)   COVID-19 virus infection    approx 11/09/19---got antibody infusion   Diabetes mellitus without complication ([place]) 00/17/4944   Fatty liver 09/19/09 (u/s)   Mildly elevated transaminases   GERD 03/21/2010   History of adenomatous polyp of colon 12/2018   Recall 2023.   History of herpes zoster 03/2012   HYPERLIPIDEMIA 03/21/2010   Intol of simva?   HYPERTENSION 03/21/2010   Obesity, Class II, BMI 35-39.9    Seizures ([place])    as child- had 2 none since- no current treatment-  never dx'd with any seizure d/o    Subclinical hypothyroidism 2021    Past Surgical History:  Procedure Laterality Date   CESAREAN SECTION     2003, 2005   COLONOSCOPY  01/05/2019   polyp ->recall 3 yrs   LAPAROSCOPIC HYSTERECTOMY  02/2014   Ovaries remain (tubes out): done for DUB   TUBAL LIGATION  2011   Dr. Reino Kent is her GYN.   WISDOM TOOTH EXTRACTION      Family History  Problem Relation Age of Onset   Cancer Mother        colon- pt is unsure ever had colon cancer, ovarian, lung, breast- breast primary cancer- dx;d in her 15's- she is still alive 12-2018   Heart disease Mother    Hypertension Mother    Hyperlipidemia Mother    Diabetes Mother        type ll   Breast cancer Mother    Ovarian cancer Mother    Lung cancer Mother    Esophageal cancer Mother    Colon cancer Maternal Uncle    Rectal cancer Neg Hx    Stomach cancer Neg Hx     Social History   Socioeconomic History   Marital status: Married    Spouse name: Not on file   Number of children: Not on file   Years of education: Not on file   Highest education level: Not on file  Occupational History   Not on file  Tobacco Use   Smoking status: Never   Smokeless tobacco: Never  Vaping Use   Vaping Use: Never used  Substance and Sexual Activity   Alcohol use: No   Drug use: No   Sexual activity: Not on file  Other Topics Concern   Not on file  Social History Narrative   Married, 1 child.   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   Orig from [place], currently living in [place].   No T/A/Ds.   Exercise: 1- 3 days a week, steps + cardio.   Social Determinants of Health   Financial Resource Strain: Not on file  Food Insecurity: Not on file  Transportation Needs: Not on file  Physical Activity: Not on file  Stress: Not on file  Social Connections: Not on file  Intimate Partner Violence: Not on file    Outpatient Medications Prior to Visit  Medication  Sig Dispense Refill   dapagliflozin propanediol (FARXIGA) 10 MG TABS tablet Take 1 tablet (10 mg total) by mouth daily. 90 tablet 3   enalapril (VASOTEC) 20 MG tablet Take 2 tablets (40 mg total) by mouth daily. 180 tablet 3   glipiZIDE (GLUCOTROL XL) 10 MG 24 hr tablet TAKE 1 TABLET (10 MG TOTAL) BY MOUTH DAILY WITH BREAKFAST. 30 tablet 2   metFORMIN (GLUCOPHAGE) 1000 MG tablet Take 1 tablet (1,000 mg total) by mouth 2 (two) times daily with a meal. 180 tablet 3   metoprolol succinate (TOPROL-XL) 100 MG 24 hr tablet Take 2 tablets (200 mg total) by mouth daily. Take with or immediately following a meal. 180 tablet 3   Semaglutide,0.25 or 0.5MG /DOS, (OZEMPIC, 0.25 OR 0.5 MG/DOSE,) 2 MG/1.5ML SOPN 0.5mg  SQ q week 6 mL 3   No facility-administered medications prior to visit.    Allergies  Allergen Reactions   Codeine Hives    ROS Review of Systems  Constitutional:  Negative for appetite change, chills, fatigue and fever.  HENT:  Negative for congestion, dental problem, ear pain and sore throat.   Eyes:  Negative for discharge, redness and visual disturbance.  Respiratory:  Negative for cough, chest tightness, shortness of breath and wheezing.   Cardiovascular:  Negative for chest pain, palpitations and leg swelling.  Gastrointestinal:  Negative for abdominal pain, blood in stool, diarrhea, nausea and vomiting.  Genitourinary:  Negative for difficulty urinating, dysuria, flank pain, frequency, hematuria and urgency.  Musculoskeletal:  Negative for arthralgias, back pain, joint swelling, myalgias and neck stiffness.  Skin:  Negative for pallor and rash.  Neurological:  Negative for dizziness, speech difficulty, weakness and headaches.  Hematological:  Negative for adenopathy. Does not bruise/bleed easily.  Psychiatric/Behavioral:  Negative for confusion and sleep disturbance. The patient is not nervous/anxious.    PE; Vitals with BMI 01/02/2021 10/24/2020 09/26/2020  Height 5' 4.5" 5\' 3"   5\' 3"   Weight 220 lbs 6 oz 219 lbs 6 oz 226 lbs  BMI 37.26 30.86 57.84  Systolic 696 295 284  Diastolic 77 73 76  Pulse 69 77 69   Exam chaperoned by Deveron Furlong, CMA. Gen: Alert, well appearing.  Patient is oriented to person, place, time, and situation. AFFECT: pleasant, lucid thought and speech. ENT: Ears: EACs clear, normal epithelium.  TMs with good light reflex and landmarks bilaterally.  Eyes: no injection, icteris, swelling, or exudate.  EOMI, PERRLA. Nose: no drainage or turbinate edema/swelling.  No injection or focal lesion.  Mouth: lips without lesion/swelling.  Oral mucosa pink and moist.  Dentition intact and  without obvious caries or gingival swelling.  Oropharynx without erythema, exudate, or swelling.  Neck: supple/nontender.  No LAD, mass, or TM.  Carotid pulses 2+ bilaterally, without bruits. CV: RRR, no m/r/g.   LUNGS: CTA bilat, nonlabored resps, good aeration in all lung fields. ABD: soft, NT, ND, BS normal.  No hepatospenomegaly or mass.  No bruits. EXT: no clubbing, cyanosis, or edema.  Musculoskeletal: no joint swelling, erythema, warmth, or tenderness.  ROM of all joints intact. Skin - no sores or suspicious lesions or rashes or color changes  Pertinent labs:  Lab Results  Component Value Date   TSH 6.20 (H) 09/24/2020   Lab Results  Component Value Date   WBC 7.9 09/24/2020   HGB 14.1 09/24/2020   HCT 42.3 09/24/2020   MCV 86.7 09/24/2020   PLT 242.0 09/24/2020   Lab Results  Component Value Date   CREATININE 0.70 09/24/2020   BUN 19 09/24/2020   NA 135 09/24/2020   K 4.3 09/24/2020   CL 99 09/24/2020   CO2 25 09/24/2020   Lab Results  Component Value Date   ALT 28 09/24/2020   AST 19 09/24/2020   ALKPHOS 60 09/24/2020   BILITOT 0.6 09/24/2020   Lab Results  Component Value Date   CHOL 215 (H) 09/24/2020   Lab Results  Component Value Date   HDL 54.90 09/24/2020   Lab Results  Component Value Date   LDLCALC 87 04/17/2020    Lab Results  Component Value Date   TRIG 210.0 (H) 09/24/2020   Lab Results  Component Value Date   CHOLHDL 4 09/24/2020   Lab Results  Component Value Date   HGBA1C 9.0 (H) 09/24/2020   ASSESSMENT AND PLAN:   1) DM 2, poor control. Hba1c today. Feet exam today normal. Side effects from ozempic + insurer not covering anymore. Next add on is insulin but will wait and see what a1c shows.  2) HTN: well controlled on enalapril 20 qd and toprol xl 25 qd. Lytes/cr today.  3) HLD: LDL 87 eight mo ago.   FLP today.  Intol simva. Trial of new statin if LDL not <70.  4) Subclinical hypothyroidism: thyroid panel today. I did bedside u/s today and noted general scattered nodularity of both lobes, no marked thyroid enlargement.  Obtain formal thyroid u/s with radiologist.  5) Health maintenance exam: Reviewed age and gender appropriate health maintenance issues (prudent diet, regular exercise, health risks of tobacco and excessive alcohol, use of seatbelts, fire alarms in home, use of sunscreen).  Also reviewed age and gender appropriate health screening as well as vaccine recommendations. Vaccines: Prevnar 20->declined.  Shingrix->declined.  Flu->declined.   Labs: bmet, lipids, thyroid panel (subclin hypoth), Hba1c. Cervical ca screening: per Dr. Gaetano Net, GYN.  Pt with hx of hysterectomy for benign dx. Breast ca screening: mammogram due next month (Dr. Gaetano Net). Colon ca screening: recall 12/2021.  An After Visit Summary was printed and given to the patient.  FOLLOW UP:  Return in about 3 months (around 04/04/2021) for routine chronic illness f/u.  Signed:  Crissie Sickles, MD           01/02/2021

## 2021-01-02 NOTE — Patient Instructions (Signed)
Health Maintenance, Female Adopting a healthy lifestyle and getting preventive care are important in promoting health and wellness. Ask your health care provider about: The right schedule for you to have regular tests and exams. Things you can do on your own to prevent diseases and keep yourself healthy. What should I know about diet, weight, and exercise? Eat a healthy diet  Eat a diet that includes plenty of vegetables, fruits, low-fat dairy products, and lean protein. Do not eat a lot of foods that are high in solid fats, added sugars, or sodium. Maintain a healthy weight Body mass index (BMI) is used to identify weight problems. It estimates body fat based on height and weight. Your health care provider can help determine your BMI and help you achieve or maintain a healthy weight. Get regular exercise Get regular exercise. This is one of the most important things you can do for your health. Most adults should: Exercise for at least 150 minutes each week. The exercise should increase your heart rate and make you sweat (moderate-intensity exercise). Do strengthening exercises at least twice a week. This is in addition to the moderate-intensity exercise. Spend less time sitting. Even light physical activity can be beneficial. Watch cholesterol and blood lipids Have your blood tested for lipids and cholesterol at 52 years of age, then have this test every 5 years. Have your cholesterol levels checked more often if: Your lipid or cholesterol levels are high. You are older than 52 years of age. You are at high risk for heart disease. What should I know about cancer screening? Depending on your health history and family history, you may need to have cancer screening at various ages. This may include screening for: Breast cancer. Cervical cancer. Colorectal cancer. Skin cancer. Lung cancer. What should I know about heart disease, diabetes, and high blood pressure? Blood pressure and heart  disease High blood pressure causes heart disease and increases the risk of stroke. This is more likely to develop in people who have high blood pressure readings, are of African descent, or are overweight. Have your blood pressure checked: Every 3-5 years if you are 18-39 years of age. Every year if you are 40 years old or older. Diabetes Have regular diabetes screenings. This checks your fasting blood sugar level. Have the screening done: Once every three years after age 40 if you are at a normal weight and have a low risk for diabetes. More often and at a younger age if you are overweight or have a high risk for diabetes. What should I know about preventing infection? Hepatitis B If you have a higher risk for hepatitis B, you should be screened for this virus. Talk with your health care provider to find out if you are at risk for hepatitis B infection. Hepatitis C Testing is recommended for: Everyone born from 1945 through 1965. Anyone with known risk factors for hepatitis C. Sexually transmitted infections (STIs) Get screened for STIs, including gonorrhea and chlamydia, if: You are sexually active and are younger than 52 years of age. You are older than 52 years of age and your health care provider tells you that you are at risk for this type of infection. Your sexual activity has changed since you were last screened, and you are at increased risk for chlamydia or gonorrhea. Ask your health care provider if you are at risk. Ask your health care provider about whether you are at high risk for HIV. Your health care provider may recommend a prescription medicine   to help prevent HIV infection. If you choose to take medicine to prevent HIV, you should first get tested for HIV. You should then be tested every 3 months for as long as you are taking the medicine. Pregnancy If you are about to stop having your period (premenopausal) and you may become pregnant, seek counseling before you get  pregnant. Take 400 to 800 micrograms (mcg) of folic acid every day if you become pregnant. Ask for birth control (contraception) if you want to prevent pregnancy. Osteoporosis and menopause Osteoporosis is a disease in which the bones lose minerals and strength with aging. This can result in bone fractures. If you are 65 years old or older, or if you are at risk for osteoporosis and fractures, ask your health care provider if you should: Be screened for bone loss. Take a calcium or vitamin D supplement to lower your risk of fractures. Be given hormone replacement therapy (HRT) to treat symptoms of menopause. Follow these instructions at home: Lifestyle Do not use any products that contain nicotine or tobacco, such as cigarettes, e-cigarettes, and chewing tobacco. If you need help quitting, ask your health care provider. Do not use street drugs. Do not share needles. Ask your health care provider for help if you need support or information about quitting drugs. Alcohol use Do not drink alcohol if: Your health care provider tells you not to drink. You are pregnant, may be pregnant, or are planning to become pregnant. If you drink alcohol: Limit how much you use to 0-1 drink a day. Limit intake if you are breastfeeding. Be aware of how much alcohol is in your drink. In the [place] one drink equals one 12 oz bottle of beer (355 mL), one 5 oz glass of wine (148 mL), or one 1 oz glass of hard liquor (44 mL). General instructions Schedule regular health, dental, and eye exams. Stay current with your vaccines. Tell your health care provider if: You often feel depressed. You have ever been abused or do not feel safe at home. Summary Adopting a healthy lifestyle and getting preventive care are important in promoting health and wellness. Follow your health care provider's instructions about healthy diet, exercising, and getting tested or screened for diseases. Follow your health care provider's  instructions on monitoring your cholesterol and blood pressure. This information is not intended to replace advice given to you by your health care provider. Make sure you discuss any questions you have with your health care provider. Document Revised: 05/11/2020 Document Reviewed: 02/24/2018 Elsevier Patient Education  2022 Elsevier Inc.  

## 2021-01-03 LAB — T3: T3, Total: 111 ng/dL (ref 76–181)

## 2021-01-03 MED ORDER — ROSUVASTATIN CALCIUM 5 MG PO TABS: 5.0000 mg | ORAL_TABLET | Freq: Every day | ORAL | 5 refills | Status: DC

## 2021-01-03 NOTE — Telephone Encounter (Signed)
-----   Message from Tammi Sou, MD sent at 01/03/2021  9:42 AM EDT ----- Hba1c improved some to 8.2%.  I still recommend she start a once daily dose of insulin.  Check with insurer about coverage for lantus and levemir. Let us know. Also, cholesterol significantly elevated. I recommend rosuvastatin 5mg , 1 tab po qd, #30, RF x 5.

## 2021-01-15 ENCOUNTER — Ambulatory Visit
Admission: RE | Admit: 2021-01-15 | Discharge: 2021-01-15 | Disposition: A | Discharge status: Home / Self Care | Payer: BC Managed Care – PPO | Source: Ambulatory Visit | Attending: Family Medicine | Admitting: Family Medicine

## 2021-01-15 DIAGNOSIS — E039 Hypothyroidism, unspecified: Secondary | ICD-10-CM | POA: Diagnosis not present

## 2021-01-18 ENCOUNTER — Other Ambulatory Visit: Payer: Self-pay | Admitting: Family Medicine

## 2021-01-18 ENCOUNTER — Encounter: Payer: Self-pay | Admitting: Family Medicine

## 2021-01-18 DIAGNOSIS — E042 Nontoxic multinodular goiter: Secondary | ICD-10-CM

## 2021-01-18 DIAGNOSIS — E038 Other specified hypothyroidism: Secondary | ICD-10-CM

## 2021-02-18 ENCOUNTER — Telehealth: Payer: Self-pay | Admitting: Family Medicine

## 2021-02-18 ENCOUNTER — Other Ambulatory Visit: Payer: Self-pay

## 2021-02-18 MED ORDER — DAPAGLIFLOZIN PROPANEDIOL 10 MG PO TABS: 10.0000 mg | ORAL_TABLET | Freq: Every day | ORAL | 3 refills | Status: DC

## 2021-02-18 MED ORDER — ROSUVASTATIN CALCIUM 5 MG PO TABS: 5.0000 mg | ORAL_TABLET | Freq: Every day | ORAL | 0 refills | Status: DC

## 2021-02-18 MED ORDER — METOPROLOL SUCCINATE ER 100 MG PO TB24: 200.0000 mg | ORAL_TABLET | Freq: Every day | ORAL | 3 refills | Status: DC

## 2021-02-18 MED ORDER — GLIPIZIDE ER 10 MG PO TB24: 10.0000 mg | ORAL_TABLET | Freq: Every day | ORAL | 0 refills | Status: DC

## 2021-02-18 MED ORDER — METFORMIN HCL 1000 MG PO TABS
1000.0000 mg | ORAL_TABLET | Freq: Two times a day (BID) | ORAL | 3 refills | Status: DC
Start: 2021-02-18 — End: 2023-07-24

## 2021-02-18 NOTE — Telephone Encounter (Signed)
Caller Name: Aliciana Call back phone #: [phone]  MEDICATION(S):  rosuvastatin (CRESTOR) 5 MG tablet dapagliflozin propanediol (FARXIGA) 10 MG TABS tablet  metFORMIN (GLUCOPHAGE) 1000 MG tablet  metoprolol succinate (TOPROL-XL) 100 MG 24 hr tablet glipiZIDE (GLUCOTROL XL) 10 MG 24 hr tablet   Has the patient contacted their pharmacy? Yes.  Pt has to use mail order now and needs 90 day RXs sent in  Preferred Pharmacy:  [place], [place] Phone:  [phone]  Fax:  [phone]

## 2021-02-18 NOTE — Telephone Encounter (Signed)
All medications requested have been refilled thru mail order. Pt was notified and mentioned she would be out of metoprolol and farxiga. Advised she could get 30 d/s of medications with [facility] with current rxs still available. She will cancel remaining refills with [facility] thru their app. Nothing further needed

## 2021-04-04 ENCOUNTER — Ambulatory Visit: Payer: BC Managed Care – PPO | Admitting: Family Medicine

## 2021-04-22 ENCOUNTER — Ambulatory Visit: Payer: BC Managed Care – PPO | Admitting: Family Medicine

## 2021-04-22 ENCOUNTER — Encounter: Payer: Self-pay | Admitting: Family Medicine

## 2021-04-22 ENCOUNTER — Other Ambulatory Visit: Payer: Self-pay

## 2021-04-22 VITALS — BP 118/81 | HR 58 | Temp 97.6°F | Ht 64.5 in | Wt 223.6 lb

## 2021-04-22 DIAGNOSIS — S60459A Superficial foreign body of unspecified finger, initial encounter: Secondary | ICD-10-CM | POA: Diagnosis not present

## 2021-04-22 MED ORDER — CEPHALEXIN 500 MG PO CAPS: 500.0000 mg | ORAL_CAPSULE | Freq: Three times a day (TID) | ORAL | 0 refills | Status: DC

## 2021-04-22 NOTE — Progress Notes (Signed)
OFFICE VISIT  04/22/2021  CC:  Chief Complaint  Patient presents with   Splinter     Underneath left ring finger, pt removed most of it yesterday.     Patient is a 51 y.o. female who presents for "wood stuck underneath nail".  HPI: Yesterday morning was grabbing a wicker basket and got wood splinter under L hand ring fingernail. Pulled a little bit of wood out but some remains---finger red, swollen,tender.  Past Medical History:  Diagnosis Date   Acute DVT (deep venous thrombosis) (HCC)    DVT 2012-january, L leg, in the context of post-C section. Again 2021, L leg, dx'd shortly after having covid infection->took eliquis x 33mo.   Cholelithiasis without obstruction 09/19/09 (u/s)   COVID-19 virus infection    approx 11/09/19---got antibody infusion   Diabetes mellitus without complication ([place]) 96/78/9381   Fatty liver 09/19/09 (u/s)   Mildly elevated transaminases   GERD 03/21/2010   History of adenomatous polyp of colon 12/2018   Recall 2023.   History of herpes zoster 03/2012   HYPERLIPIDEMIA 03/21/2010   Intol of simva?   HYPERTENSION 03/21/2010   Obesity, Class II, BMI 35-39.9    Seizures ([place])    as child- had 2 none since- no current treatment- never dx'd with any seizure d/o    Subclinical hypothyroidism 2021   2021-22->multinod goiter on u/s 01/2021    Past Surgical History:  Procedure Laterality Date   CESAREAN SECTION     2003, 2005   COLONOSCOPY  01/05/2019   polyp ->recall 3 yrs   LAPAROSCOPIC HYSTERECTOMY  02/2014   Ovaries remain (tubes out): done for DUB   TUBAL LIGATION  2011   Dr. Reino Kent is her GYN.   WISDOM TOOTH EXTRACTION      Outpatient Medications Prior to Visit  Medication Sig Dispense Refill   dapagliflozin propanediol (FARXIGA) 10 MG TABS tablet Take 1 tablet (10 mg total) by mouth daily. 90 tablet 3   enalapril (VASOTEC) 20 MG tablet Take 2 tablets (40 mg total) by mouth daily. 180 tablet 3   glipiZIDE (GLUCOTROL XL) 10 MG 24 hr tablet Take  1 tablet (10 mg total) by mouth daily with breakfast. TAKE 1 TABLET (10 MG TOTAL) BY MOUTH DAILY WITH BREAKFAST. 90 tablet 0   metFORMIN (GLUCOPHAGE) 1000 MG tablet Take 1 tablet (1,000 mg total) by mouth 2 (two) times daily with a meal. 180 tablet 3   metoprolol succinate (TOPROL-XL) 100 MG 24 hr tablet Take 2 tablets (200 mg total) by mouth daily. Take with or immediately following a meal. 180 tablet 3   rosuvastatin (CRESTOR) 5 MG tablet Take 1 tablet (5 mg total) by mouth daily. 90 tablet 0   No facility-administered medications prior to visit.    Allergies  Allergen Reactions   Codeine Hives    ROS As per HPI  PE: Vitals with BMI 04/22/2021 01/02/2021 10/24/2020  Height 5' 4.5" 5' 4.5" 5\' 3"   Weight 223 lbs 10 oz 220 lbs 6 oz 219 lbs 6 oz  BMI 37.8 01.75 10.25  Systolic 852 778 242  Diastolic 81 77 73  Pulse 58 69 77     Physical Exam  L hand ring finger with erythema under distal nail edge, very tender.  Other than focally around the splinter, there is no finger swelling. I cannot see any foreign body. A tiny area of lightyellow striation/subQ edema noted.  LABS:  Last CBC Lab Results  Component Value Date   WBC 7.9  09/24/2020   HGB 14.1 09/24/2020   HCT 42.3 09/24/2020   MCV 86.7 09/24/2020   MCH 28.5 10/10/2009   RDW 15.1 09/24/2020   PLT 242.0 84/16/6063   Last metabolic panel Lab Results  Component Value Date   GLUCOSE 202 (H) 01/02/2021   NA 138 01/02/2021   K 4.3 01/02/2021   CL 103 01/02/2021   CO2 26 01/02/2021   BUN 22 01/02/2021   CREATININE 0.72 01/02/2021   CALCIUM 9.1 01/02/2021   PROT 6.7 09/24/2020   ALBUMIN 4.4 09/24/2020   BILITOT 0.6 09/24/2020   ALKPHOS 60 09/24/2020   AST 19 09/24/2020   ALT 28 09/24/2020   IMPRESSION AND PLAN:  Wood splinter under L fingernail.  I cannot see any splinter today. Improving since she pulled some splinter out yesterday. Soak in epsom salt x 83min 3 times a day. Keflex 500 tid x 7d. If not  improving then return and I'll numb finger and search for any residual FB.  An After Visit Summary was printed and given to the patient.  FOLLOW UP: Return if symptoms worsen or fail to improve.  Signed:  Crissie Sickles, MD           04/22/2021

## 2021-06-17 ENCOUNTER — Other Ambulatory Visit: Payer: Self-pay

## 2021-06-17 MED ORDER — ENALAPRIL MALEATE 20 MG PO TABS: 40.0000 mg | ORAL_TABLET | Freq: Every day | ORAL | 2 refills | Status: DC

## 2021-06-17 MED ORDER — DAPAGLIFLOZIN PROPANEDIOL 10 MG PO TABS
10.0000 mg | ORAL_TABLET | Freq: Every day | ORAL | 2 refills | Status: DC
Start: 2021-06-17 — End: 2022-05-19

## 2021-07-07 ENCOUNTER — Other Ambulatory Visit: Payer: Self-pay | Admitting: Family Medicine

## 2021-08-05 ENCOUNTER — Telehealth: Payer: Self-pay

## 2021-08-05 DIAGNOSIS — E038 Other specified hypothyroidism: Secondary | ICD-10-CM

## 2021-08-05 DIAGNOSIS — I10 Essential (primary) hypertension: Secondary | ICD-10-CM

## 2021-08-05 DIAGNOSIS — E119 Type 2 diabetes mellitus without complications: Secondary | ICD-10-CM

## 2021-08-05 DIAGNOSIS — E7800 Pure hypercholesterolemia, unspecified: Secondary | ICD-10-CM

## 2021-08-05 NOTE — Telephone Encounter (Signed)
Please assist patient with scheduling, thanks. 

## 2021-08-05 NOTE — Telephone Encounter (Signed)
Please review and advise.

## 2021-08-05 NOTE — Telephone Encounter (Signed)
Patient scheduled appt with Dr. Anitra Lauth for 08/23/21.  Patient is requesting labs to be done prior to appt.  I told patient if approved by provider and if/when orders are entered into chart - I will call to schedule lab appt.  She agreed and understood.  Patient can be reached at [phone].

## 2021-08-23 ENCOUNTER — Encounter: Payer: Self-pay | Admitting: Family Medicine

## 2021-08-23 ENCOUNTER — Ambulatory Visit: Payer: BC Managed Care – PPO | Admitting: Family Medicine

## 2021-08-23 VITALS — BP 118/72 | HR 77 | Temp 98.4°F | Wt 223.4 lb

## 2021-08-23 DIAGNOSIS — I10 Essential (primary) hypertension: Secondary | ICD-10-CM | POA: Diagnosis not present

## 2021-08-23 DIAGNOSIS — M25561 Pain in right knee: Secondary | ICD-10-CM | POA: Diagnosis not present

## 2021-08-23 DIAGNOSIS — E7800 Pure hypercholesterolemia, unspecified: Secondary | ICD-10-CM | POA: Diagnosis not present

## 2021-08-23 DIAGNOSIS — E119 Type 2 diabetes mellitus without complications: Secondary | ICD-10-CM | POA: Diagnosis not present

## 2021-08-23 DIAGNOSIS — S8391XA Sprain of unspecified site of right knee, initial encounter: Secondary | ICD-10-CM

## 2021-08-23 LAB — POCT GLYCOSYLATED HEMOGLOBIN (HGB A1C)
HbA1c POC (<> result, manual entry): 8.6 % (ref 4.0–5.6)
HbA1c, POC (controlled diabetic range): 8.6 % — AB (ref 0.0–7.0)
HbA1c, POC (prediabetic range): 8.6 % — AB (ref 5.7–6.4)
Hemoglobin A1C: 8.6 % — AB (ref 4.0–5.6)

## 2021-08-23 MED ORDER — INSULIN GLARGINE-YFGN 100 UNIT/ML ~~LOC~~ SOPN: PEN_INJECTOR | SUBCUTANEOUS | 1 refills | Status: DC

## 2021-08-23 MED ORDER — GLIPIZIDE ER 10 MG PO TB24: 10.0000 mg | ORAL_TABLET | Freq: Every day | ORAL | 1 refills | Status: DC

## 2021-08-23 MED ORDER — ROSUVASTATIN CALCIUM 5 MG PO TABS: 5.0000 mg | ORAL_TABLET | Freq: Every day | ORAL | 1 refills | Status: DC

## 2021-08-23 NOTE — Patient Instructions (Signed)
Start 10 units of insulin every night. Increase this dosing every 1 to 2 days until your fasting glucoses in the 100-110 range consistently.

## 2021-08-23 NOTE — Progress Notes (Unsigned)
OFFICE VISIT  08/23/2021  CC:  Chief Complaint  Patient presents with   Diabetes   Hypertension   Hyperlipidemia    Pt is not fasting    Patient is a 51 y.o. female who presents for follow-up diabetes, hypertension, and hyperlipidemia. A/P as of last visit Oct 2022: "1) DM 2, poor control. Hba1c today. Feet exam today normal. Side effects from ozempic + insurer not covering anymore. Next add on is insulin but will wait and see what a1c shows.   2) HTN: well controlled on enalapril 20 qd and toprol xl 25 qd. Lytes/cr today.   3) HLD: LDL 87 eight mo ago.   FLP today.  Intol simva. Trial of new statin if LDL not <70.   4) Subclinical hypothyroidism: thyroid panel today. I did bedside u/s today and noted general scattered nodularity of both lobes, no marked thyroid enlargement.  Obtain formal thyroid u/s with radiologist.   5) Health maintenance exam: Reviewed age and gender appropriate health maintenance issues (prudent diet, regular exercise, health risks of tobacco and excessive alcohol, use of seatbelts, fire alarms in home, use of sunscreen).  Also reviewed age and gender appropriate health screening as well as vaccine recommendations. Vaccines: Prevnar 20->declined.  Shingrix->declined.  Flu->declined.   Labs: bmet, lipids, thyroid panel (subclin hypoth), Hba1c. Cervical ca screening: per Dr. Gaetano Net, GYN.  Pt with hx of hysterectomy for benign dx. Breast ca screening: mammogram due next month (Dr. Gaetano Net). Colon ca screening: recall 12/2021."  INTERIM HX: *** I recommended that she restart insulin at last visit-->04/26/21***. I also recommended she start rosuvastatin 5 mg a day.  After last visit she had a thyroid ultrasound that showed some changes suggestive of acute thyroiditis.  I referred her to Endo at that time->***.  Past Medical History:  Diagnosis Date   Acute DVT (deep venous thrombosis) (HCC)    DVT 2012-january, L leg, in the context of post-C section.  Again 2021, L leg, dx'd shortly after having covid infection->took eliquis x 67mo   Cholelithiasis without obstruction 09/19/09 (u/s)   COVID-19 virus infection    approx 11/09/19---got antibody infusion   Diabetes mellitus without complication ([place] 074/02/8785  Fatty liver 09/19/09 (u/s)   Mildly elevated transaminases   GERD 03/21/2010   History of adenomatous polyp of colon 12/2018   Recall 2023.   History of herpes zoster 03/2012   HYPERLIPIDEMIA 03/21/2010   Intol of simva?   HYPERTENSION 03/21/2010   Obesity, Class II, BMI 35-39.9    Seizures ([place]    as child- had 2 none since- no current treatment- never dx'd with any seizure d/o    Subclinical hypothyroidism 2021   2021-22->multinod goiter on u/s 01/2021    Past Surgical History:  Procedure Laterality Date   CESAREAN SECTION     2003, 2005   COLONOSCOPY  01/05/2019   polyp ->recall 3 yrs   LAPAROSCOPIC HYSTERECTOMY  02/2014   Ovaries remain (tubes out): done for DUB   TUBAL LIGATION  2011   Dr. GReino Kentis her GYN.   WISDOM TOOTH EXTRACTION      Outpatient Medications Prior to Visit  Medication Sig Dispense Refill   cephALEXin (KEFLEX) 500 MG capsule Take 1 capsule (500 mg total) by mouth 3 (three) times daily. 21 capsule 0   dapagliflozin propanediol (FARXIGA) 10 MG TABS tablet Take 1 tablet (10 mg total) by mouth daily. 90 tablet 2   enalapril (VASOTEC) 20 MG tablet Take 2 tablets (40 mg total) by  mouth daily. 180 tablet 2   glipiZIDE (GLUCOTROL XL) 10 MG 24 hr tablet Take 1 tablet (10 mg total) by mouth daily with breakfast. OFFICE VISIT NEEDED FOR FURTHER REFILLS 90 tablet 0   metFORMIN (GLUCOPHAGE) 1000 MG tablet Take 1 tablet (1,000 mg total) by mouth 2 (two) times daily with a meal. 180 tablet 3   metoprolol succinate (TOPROL-XL) 100 MG 24 hr tablet Take 2 tablets (200 mg total) by mouth daily. Take with or immediately following a meal. 180 tablet 3   rosuvastatin (CRESTOR) 5 MG tablet Take 1 tablet (5 mg total) by  mouth daily. 90 tablet 0   No facility-administered medications prior to visit.    Allergies  Allergen Reactions   Codeine Hives    ROS As per HPI  PE:    08/23/2021    2:04 PM 08/23/2021    1:50 PM 04/22/2021    3:47 PM  Vitals with BMI  Height   5' 4.5"  Weight  223 lbs 6 oz 223 lbs 10 oz  BMI   09.7  Systolic 353 299 242  Diastolic 72 88 81  Pulse  77 58    Physical Exam  ***  LABS:  Last CBC Lab Results  Component Value Date   WBC 7.9 09/24/2020   HGB 14.1 09/24/2020   HCT 42.3 09/24/2020   MCV 86.7 09/24/2020   MCH 28.5 10/10/2009   RDW 15.1 09/24/2020   PLT 242.0 68/34/1962   Last metabolic panel Lab Results  Component Value Date   GLUCOSE 202 (H) 01/02/2021   NA 138 01/02/2021   K 4.3 01/02/2021   CL 103 01/02/2021   CO2 26 01/02/2021   BUN 22 01/02/2021   CREATININE 0.72 01/02/2021   CALCIUM 9.1 01/02/2021   PROT 6.7 09/24/2020   ALBUMIN 4.4 09/24/2020   BILITOT 0.6 09/24/2020   ALKPHOS 60 09/24/2020   AST 19 09/24/2020   ALT 28 09/24/2020   Last lipids Lab Results  Component Value Date   CHOL 230 (H) 01/02/2021   HDL 45.20 01/02/2021   LDLCALC 151 (H) 01/02/2021   LDLDIRECT 140.0 09/24/2020   TRIG 170.0 (H) 01/02/2021   CHOLHDL 5 01/02/2021   Last hemoglobin A1c Lab Results  Component Value Date   HGBA1C 8.6 (A) 08/23/2021   HGBA1C 8.6 08/23/2021   HGBA1C 8.6 (A) 08/23/2021   HGBA1C 8.6 (A) 08/23/2021   Last thyroid functions Lab Results  Component Value Date   TSH 3.12 01/02/2021   T3TOTAL 111 01/02/2021   IMPRESSION AND PLAN:  No problem-specific Assessment & Plan notes found for this encounter.  Start glargine 10 u and titrate R knee sprain/strain--RICE HTN--some orthost hypo/dizziness but still chronic mild elev-->no changes. CMET today Restart crestor 5   An After Visit Summary was printed and given to the patient.  FOLLOW UP: No follow-ups on file. Next cpe 12/2021  Signed:  Crissie Sickles, MD            08/23/2021

## 2021-08-24 LAB — COMPREHENSIVE METABOLIC PANEL
AG Ratio: 1.8 (calc) (ref 1.0–2.5)
ALT: 23 U/L (ref 6–29)
AST: 16 U/L (ref 10–35)
Albumin: 4.3 g/dL (ref 3.6–5.1)
Alkaline phosphatase (APISO): 77 U/L (ref 37–153)
BUN: 17 mg/dL (ref 7–25)
CO2: 24 mmol/L (ref 20–32)
Calcium: 9 mg/dL (ref 8.6–10.4)
Chloride: 107 mmol/L (ref 98–110)
Creat: 0.75 mg/dL (ref 0.50–1.03)
Globulin: 2.4 g/dL (calc) (ref 1.9–3.7)
Glucose, Bld: 128 mg/dL — ABNORMAL HIGH (ref 65–99)
Potassium: 4.1 mmol/L (ref 3.5–5.3)
Sodium: 138 mmol/L (ref 135–146)
Total Bilirubin: 0.4 mg/dL (ref 0.2–1.2)
Total Protein: 6.7 g/dL (ref 6.1–8.1)

## 2021-08-27 ENCOUNTER — Encounter: Payer: Self-pay | Admitting: Family Medicine

## 2021-08-27 ENCOUNTER — Other Ambulatory Visit: Payer: Self-pay

## 2021-08-27 MED ORDER — PEN NEEDLES 32G X 4 MM MISC: 1.0000 | Freq: Two times a day (BID) | 11 refills | Status: DC

## 2021-09-04 ENCOUNTER — Other Ambulatory Visit: Payer: Self-pay

## 2021-09-04 MED ORDER — PEN NEEDLES 32G X 4 MM MISC: 1.0000 | Freq: Two times a day (BID) | 5 refills | Status: DC

## 2021-09-04 MED ORDER — INSULIN GLARGINE-YFGN 100 UNIT/ML ~~LOC~~ SOPN: PEN_INJECTOR | SUBCUTANEOUS | 1 refills | Status: DC

## 2021-09-21 ENCOUNTER — Other Ambulatory Visit: Payer: Self-pay | Admitting: Family Medicine

## 2021-10-10 DIAGNOSIS — E119 Type 2 diabetes mellitus without complications: Secondary | ICD-10-CM | POA: Diagnosis not present

## 2021-10-10 DIAGNOSIS — Z794 Long term (current) use of insulin: Secondary | ICD-10-CM | POA: Diagnosis not present

## 2021-10-10 DIAGNOSIS — H5203 Hypermetropia, bilateral: Secondary | ICD-10-CM | POA: Diagnosis not present

## 2021-10-10 LAB — HM DIABETES EYE EXAM

## 2021-10-10 LAB — HEMOGLOBIN A1C: Hemoglobin A1C: 8

## 2021-10-25 ENCOUNTER — Ambulatory Visit (HOSPITAL_BASED_OUTPATIENT_CLINIC_OR_DEPARTMENT_OTHER)
Admission: RE | Admit: 2021-10-25 | Discharge: 2021-10-25 | Disposition: A | Discharge status: Home / Self Care | Payer: BC Managed Care – PPO | Source: Ambulatory Visit | Attending: Family Medicine | Admitting: Family Medicine

## 2021-10-25 ENCOUNTER — Ambulatory Visit: Payer: BC Managed Care – PPO | Admitting: Family Medicine

## 2021-10-25 ENCOUNTER — Encounter: Payer: Self-pay | Admitting: Family Medicine

## 2021-10-25 VITALS — Wt 228.0 lb

## 2021-10-25 DIAGNOSIS — Z86718 Personal history of other venous thrombosis and embolism: Secondary | ICD-10-CM

## 2021-10-25 DIAGNOSIS — R29898 Other symptoms and signs involving the musculoskeletal system: Secondary | ICD-10-CM | POA: Diagnosis not present

## 2021-10-25 NOTE — Progress Notes (Signed)
OFFICE VISIT  10/25/2021  CC:  Chief Complaint  Patient presents with   Knot    Behind right knee    Patient is a 51 y.o. female who presents for a knot behind R knee.  HPI: Has had some right knee last couple of months, I saw her for this back in June. Diagnosed with strain, possible popliteal cyst. She abstain from tennis for a few weeks and this got better.  Then this morning she noticed fullness in the right popliteal region. It does hurt a little bit to push in the back of her knee. Does not feel like she has any lower extremity swelling  Past Medical History:  Diagnosis Date   Acute DVT (deep venous thrombosis) (HCC)    DVT 2012-january, L leg, in the context of post-C section. Again 2021, L leg, dx'd shortly after having covid infection->took eliquis x 86mo   Cholelithiasis without obstruction 09/19/09 (u/s)   COVID-19 virus infection    approx 11/09/19---got antibody infusion   Diabetes mellitus without complication ([place] 082/80/0349  Fatty liver 09/19/09 (u/s)   Mildly elevated transaminases   GERD 03/21/2010   History of adenomatous polyp of colon 12/2018   Recall 2023.   History of herpes zoster 03/2012   HYPERLIPIDEMIA 03/21/2010   Intol of simva?   HYPERTENSION 03/21/2010   Obesity, Class II, BMI 35-39.9    Seizures ([place]    as child- had 2 none since- no current treatment- never dx'd with any seizure d/o    Subclinical hypothyroidism 2021   2021-22->multinod goiter on u/s 01/2021    Past Surgical History:  Procedure Laterality Date   CESAREAN SECTION     2003, 2005   COLONOSCOPY  01/05/2019   polyp ->recall 3 yrs   LAPAROSCOPIC HYSTERECTOMY  02/2014   Ovaries remain (tubes out): done for DUB   TUBAL LIGATION  2011   Dr. GReino Kentis her GYN.   WISDOM TOOTH EXTRACTION      Outpatient Medications Prior to Visit  Medication Sig Dispense Refill   dapagliflozin propanediol (FARXIGA) 10 MG TABS tablet Take 1 tablet (10 mg total) by mouth daily. 90 tablet 2    enalapril (VASOTEC) 20 MG tablet Take 2 tablets (40 mg total) by mouth daily. 180 tablet 2   glipiZIDE (GLUCOTROL XL) 10 MG 24 hr tablet TAKE 1 TABLET DAILY WITH BREAKFAST (OFFICE VISIT NEEDED FOR FURTHER REFILLS) 90 tablet 1   insulin glargine-yfgn (SEMGLEE, YFGN,) 100 UNIT/ML Pen 10 U SQ qd 15 mL 1   Insulin Pen Needle (PEN NEEDLES) 32G X 4 MM MISC 1 each by Does not apply route 2 (two) times daily. 200 each 5   metFORMIN (GLUCOPHAGE) 1000 MG tablet Take 1 tablet (1,000 mg total) by mouth 2 (two) times daily with a meal. 180 tablet 3   metoprolol succinate (TOPROL-XL) 100 MG 24 hr tablet Take 2 tablets (200 mg total) by mouth daily. Take with or immediately following a meal. 180 tablet 3   rosuvastatin (CRESTOR) 5 MG tablet Take 1 tablet (5 mg total) by mouth daily. 90 tablet 1   cephALEXin (KEFLEX) 500 MG capsule Take 1 capsule (500 mg total) by mouth 3 (three) times daily. 21 capsule 0   No facility-administered medications prior to visit.    Allergies  Allergen Reactions   Codeine Hives    ROS As per HPI  PE:    10/25/2021    1:29 PM 08/23/2021    2:04 PM 08/23/2021  1:50 PM  Vitals with BMI  Weight 228 lbs  462 lbs 6 oz  Systolic  863 817  Diastolic  72 88  Pulse   77     Physical Exam  Gen: Alert, well appearing.  Patient is oriented to person, place, time, and situation. AFFECT: pleasant, lucid thought and speech. Left popliteal fossa fullness but no distinct nodule.  This is subtle/mild. No erythema.  Mildly tender to deep palpation.  No lower extremity edema. Anterior lateral aspects of left knee without tenderness or swelling.  LABS:  Last CBC Lab Results  Component Value Date   WBC 7.9 09/24/2020   HGB 14.1 09/24/2020   HCT 42.3 09/24/2020   MCV 86.7 09/24/2020   MCH 28.5 10/10/2009   RDW 15.1 09/24/2020   PLT 242.0 71/16/5790   Last metabolic panel Lab Results  Component Value Date   GLUCOSE 128 (H) 08/23/2021   NA 138 08/23/2021   K 4.1 08/23/2021    CL 107 08/23/2021   CO2 24 08/23/2021   BUN 17 08/23/2021   CREATININE 0.75 08/23/2021   CALCIUM 9.0 08/23/2021   PROT 6.7 08/23/2021   ALBUMIN 4.4 09/24/2020   BILITOT 0.4 08/23/2021   ALKPHOS 60 09/24/2020   AST 16 08/23/2021   ALT 23 08/23/2021   IMPRESSION AND PLAN:  #1 right popliteal fossa fullness. History of DVT in left leg x 2 (after C-section in 2012 and after COVID 2021.). Lower extremity venous Doppler stat today.  An After Visit Summary was printed and given to the patient.  FOLLOW UP: No follow-ups on file.  Signed:  Crissie Sickles, MD           10/25/2021

## 2021-11-28 ENCOUNTER — Ambulatory Visit: Payer: BC Managed Care – PPO | Admitting: Family Medicine

## 2021-11-28 VITALS — BP 113/74 | HR 71 | Temp 97.8°F | Ht 64.5 in | Wt 229.4 lb

## 2021-11-28 DIAGNOSIS — J180 Bronchopneumonia, unspecified organism: Secondary | ICD-10-CM | POA: Diagnosis not present

## 2021-11-28 MED ORDER — DOXYCYCLINE HYCLATE 100 MG PO CAPS: 100.0000 mg | ORAL_CAPSULE | Freq: Two times a day (BID) | ORAL | 0 refills | Status: AC

## 2021-11-28 MED ORDER — PREDNISONE 20 MG PO TABS: ORAL_TABLET | ORAL | 0 refills | Status: DC

## 2021-11-28 MED ORDER — ALBUTEROL SULFATE HFA 108 (90 BASE) MCG/ACT IN AERS: 2.0000 | INHALATION_SPRAY | Freq: Four times a day (QID) | RESPIRATORY_TRACT | 1 refills | Status: DC | PRN

## 2021-11-28 NOTE — Progress Notes (Signed)
OFFICE VISIT  11/28/2021  CC:  Chief Complaint  Patient presents with   Cough    4 weeks, morning and night are when coughing spells occur and with temperature changes. She does notice shortness of breath and feeling winded after this as well. No otc meds have been used. Notes as productive cough with clear to yellow phlegm   Patient is a 51 y.o. female who presents for cough.  HPI: About 1 month ago she developed nasal congestion, postnasal drip, and some cough.  The cough has persisted, is worse at night.  She hears an upper airway wheeze sometimes.  Mild tightness in the chest but no real shortness of breath.  No fevers.  No face pain.  Has had a mild headache the last couple of days. The cough keeps her up at night.  No body aches.  ROS as above, plus-->  no CP,  no dizziness,  no rashes, no melena/hematochezia.  No polyuria or polydipsia.  No focal weakness, paresthesias, or tremors.  No acute vision or hearing abnormalities.  No dysuria or unusual/new urinary urgency or frequency.  No recent changes in lower legs. No n/v/d or abd pain.  No palpitations.      Past Medical History:  Diagnosis Date   Acute DVT (deep venous thrombosis) (HCC)    DVT 2012-january, L leg, in the context of post-C section. Again 2021, L leg, dx'd shortly after having covid infection->took eliquis x 13mo   Cholelithiasis without obstruction 09/19/09 (u/s)   COVID-19 virus infection    approx 11/09/19---got antibody infusion   Diabetes mellitus without complication ([place] 013/10/6576  Fatty liver 09/19/09 (u/s)   Mildly elevated transaminases   GERD 03/21/2010   History of adenomatous polyp of colon 12/2018   Recall 2023.   History of herpes zoster 03/2012   HYPERLIPIDEMIA 03/21/2010   Intol of simva?   HYPERTENSION 03/21/2010   Obesity, Class II, BMI 35-39.9    Seizures ([place]    as child- had 2 none since- no current treatment- never dx'd with any seizure d/o    Subclinical hypothyroidism 2021    2021-22->multinod goiter on u/s 01/2021    Past Surgical History:  Procedure Laterality Date   CESAREAN SECTION     2003, 2005   COLONOSCOPY  01/05/2019   polyp ->recall 3 yrs   LAPAROSCOPIC HYSTERECTOMY  02/2014   Ovaries remain (tubes out): done for DUB   TUBAL LIGATION  2011   Dr. GReino Kentis her GYN.   WISDOM TOOTH EXTRACTION      Outpatient Medications Prior to Visit  Medication Sig Dispense Refill   dapagliflozin propanediol (FARXIGA) 10 MG TABS tablet Take 1 tablet (10 mg total) by mouth daily. 90 tablet 2   enalapril (VASOTEC) 20 MG tablet Take 2 tablets (40 mg total) by mouth daily. 180 tablet 2   glipiZIDE (GLUCOTROL XL) 10 MG 24 hr tablet TAKE 1 TABLET DAILY WITH BREAKFAST (OFFICE VISIT NEEDED FOR FURTHER REFILLS) 90 tablet 1   insulin glargine-yfgn (SEMGLEE, YFGN,) 100 UNIT/ML Pen 10 U SQ qd (Patient taking differently: 16 U SQ qd) 15 mL 1   Insulin Pen Needle (PEN NEEDLES) 32G X 4 MM MISC 1 each by Does not apply route 2 (two) times daily. 200 each 5   metFORMIN (GLUCOPHAGE) 1000 MG tablet Take 1 tablet (1,000 mg total) by mouth 2 (two) times daily with a meal. 180 tablet 3   metoprolol succinate (TOPROL-XL) 100 MG 24 hr tablet Take 2 tablets (  200 mg total) by mouth daily. Take with or immediately following a meal. 180 tablet 3   rosuvastatin (CRESTOR) 5 MG tablet Take 1 tablet (5 mg total) by mouth daily. 90 tablet 1   No facility-administered medications prior to visit.    Allergies  Allergen Reactions   Codeine Hives    ROS As per HPI  PE:    11/28/2021    3:30 PM 10/25/2021    1:29 PM 08/23/2021    2:04 PM  Vitals with BMI  Height 5' 4.5"    Weight 229 lbs 6 oz 228 lbs   BMI 16.10    Systolic 960  454  Diastolic 74  72  Pulse 71       Physical Exam  VS: noted--normal. Gen: alert, NAD, NONTOXIC APPEARING. HEENT: eyes without injection, drainage, or swelling.  Ears: EACs clear, TMs with normal light reflex and landmarks.  Nose: Clear rhinorrhea, with  some dried, crusty exudate adherent to mildly injected mucosa.  No purulent d/c.  No paranasal sinus TTP.  No facial swelling.  Throat and mouth without focal lesion.  No pharyngial swelling, erythema, or exudate.   Neck: supple, no LAD.   LUNGS: CTA bilat, nonlabored resps.  Has dry coughing fits at the end of exhalation. CV: RRR, no m/r/g. EXT: no c/c/e SKIN: no rash   LABS:  Last CBC Lab Results  Component Value Date   WBC 7.9 09/24/2020   HGB 14.1 09/24/2020   HCT 42.3 09/24/2020   MCV 86.7 09/24/2020   MCH 28.5 10/10/2009   RDW 15.1 09/24/2020   PLT 242.0 09/81/1914   Last metabolic panel Lab Results  Component Value Date   GLUCOSE 128 (H) 08/23/2021   NA 138 08/23/2021   K 4.1 08/23/2021   CL 107 08/23/2021   CO2 24 08/23/2021   BUN 17 08/23/2021   CREATININE 0.75 08/23/2021   CALCIUM 9.0 08/23/2021   PROT 6.7 08/23/2021   ALBUMIN 4.4 09/24/2020   BILITOT 0.4 08/23/2021   ALKPHOS 60 09/24/2020   AST 16 08/23/2021   ALT 23 08/23/2021   Lab Results  Component Value Date   HGBA1C 8.0 10/10/2021   IMPRESSION AND PLAN:  Acute bronchopneumonia , possibly bacterial. Doxycycline 100 twice daily x7 days. Prednisone 40 mg a day x5 days, then 20 mg a day x5 days. Albuterol inhaler 2 puffs every 6 hours as needed. She will continue over-the-counter cough medication.  Told patient to expect prednisone to increase her glucoses by 15% or so and she will adjust insulin as appropriate.  An After Visit Summary was printed and given to the patient.  FOLLOW UP: Return if symptoms worsen or fail to improve.  Signed:  Crissie Sickles, MD           11/28/2021

## 2021-12-03 DIAGNOSIS — E1165 Type 2 diabetes mellitus with hyperglycemia: Secondary | ICD-10-CM | POA: Diagnosis not present

## 2021-12-06 ENCOUNTER — Encounter: Payer: Self-pay | Admitting: Family Medicine

## 2021-12-06 ENCOUNTER — Encounter: Payer: Self-pay | Admitting: Gastroenterology

## 2021-12-06 MED ORDER — PREDNISONE 10 MG PO TABS: ORAL_TABLET | ORAL | 0 refills | Status: DC

## 2021-12-06 NOTE — Telephone Encounter (Signed)
Okay, I sent in a taper of prednisone.  Is she using her albuterol inhaler? Follow-up next week in the office if not improving significantly.

## 2021-12-06 NOTE — Telephone Encounter (Signed)
Please advise 

## 2021-12-17 ENCOUNTER — Encounter: Payer: BC Managed Care – PPO | Admitting: Gastroenterology

## 2021-12-17 ENCOUNTER — Ambulatory Visit (AMBULATORY_SURGERY_CENTER): Payer: Self-pay

## 2021-12-17 VITALS — Ht 64.5 in | Wt 221.4 lb

## 2021-12-17 DIAGNOSIS — Z86010 Personal history of colonic polyps: Secondary | ICD-10-CM

## 2021-12-17 MED ORDER — NA SULFATE-K SULFATE-MG SULF 17.5-3.13-1.6 GM/177ML PO SOLN: 1.0000 | Freq: Once | ORAL | 0 refills | Status: AC

## 2021-12-17 NOTE — Progress Notes (Signed)
No egg or soy allergy known to patient  No issues known to pt with past sedation with any surgeries or procedures Patient denies ever being told they had issues or difficulty with intubation  No FH of Malignant Hyperthermia Pt is not on diet pills Pt is not on  home 02  Pt is not on blood thinners  Pt denies issues with constipation  No A fib or A flutter Have any cardiac testing pending--no Pt instructed to use [email or web address] or GoodRx for a price reduction on prep   

## 2021-12-31 DIAGNOSIS — E1165 Type 2 diabetes mellitus with hyperglycemia: Secondary | ICD-10-CM | POA: Diagnosis not present

## 2022-01-06 ENCOUNTER — Encounter: Payer: Self-pay | Admitting: Gastroenterology

## 2022-01-14 ENCOUNTER — Ambulatory Visit (AMBULATORY_SURGERY_CENTER): Payer: BC Managed Care – PPO | Admitting: Gastroenterology

## 2022-01-14 ENCOUNTER — Encounter: Payer: Self-pay | Admitting: Gastroenterology

## 2022-01-14 VITALS — BP 120/75 | HR 61 | Temp 97.1°F | Resp 10 | Ht 64.5 in | Wt 221.4 lb

## 2022-01-14 DIAGNOSIS — Z09 Encounter for follow-up examination after completed treatment for conditions other than malignant neoplasm: Secondary | ICD-10-CM | POA: Diagnosis not present

## 2022-01-14 DIAGNOSIS — Z1211 Encounter for screening for malignant neoplasm of colon: Secondary | ICD-10-CM | POA: Diagnosis not present

## 2022-01-14 DIAGNOSIS — Z86010 Personal history of colonic polyps: Secondary | ICD-10-CM | POA: Diagnosis not present

## 2022-01-14 MED ORDER — SODIUM CHLORIDE 0.9 % IV SOLN: 500.0000 mL | INTRAVENOUS | Status: DC

## 2022-01-14 NOTE — Progress Notes (Signed)
Pt's states no medical or surgical changes since previsit or office visit. 

## 2022-01-14 NOTE — Op Note (Signed)
[place] Patient Name: Kristen Meyers Procedure Date: 01/14/2022 8:58 AM MRN: [id number] Endoscopist: Mauri Pole , MD, [id number] Age: 51 Referring MD:  Date of Birth: [birth year] Gender: Female Account #: [id number] Procedure:                Colonoscopy Indications:              High risk colon cancer surveillance: Personal                            history of colonic polyps, High risk colon cancer                            surveillance: Personal history of adenoma (10 mm or                            greater in size) Medicines:                Monitored Anesthesia Care Procedure:                Pre-Anesthesia Assessment:                           - Prior to the procedure, a History and Physical                            was performed, and patient medications and                            allergies were reviewed. The patient's tolerance of                            previous anesthesia was also reviewed. The risks                            and benefits of the procedure and the sedation                            options and risks were discussed with the patient.                            All questions were answered, and informed consent                            was obtained. Prior Anticoagulants: The patient has                            taken no anticoagulant or antiplatelet agents. ASA                            Grade Assessment: III - A patient with severe                            systemic disease. After reviewing the risks and  benefits, the patient was deemed in satisfactory                            condition to undergo the procedure.                           After obtaining informed consent, the colonoscope                            was passed under direct vision. Throughout the                            procedure, the patient's blood pressure, pulse, and                            oxygen saturations were monitored  continuously. The                            Olympus PCF-H190DL [id number]) Colonoscope was                            introduced through the anus and advanced to the the                            cecum, identified by appendiceal orifice and                            ileocecal valve. The colonoscopy was performed                            without difficulty. The patient tolerated the                            procedure well. The quality of the bowel                            preparation was excellent. The ileocecal valve,                            appendiceal orifice, and rectum were photographed. Scope In: 9:03:29 AM Scope Out: 9:21:34 AM Scope Withdrawal Time: 0 hours 14 minutes 18 seconds  Total Procedure Duration: 0 hours 18 minutes 5 seconds  Findings:                 The perianal and digital rectal examinations were                            normal.                           A few small-mouthed diverticula were found in the                            sigmoid colon.  Non-bleeding external and internal hemorrhoids were                            found during retroflexion. The hemorrhoids were                            small.                           The exam was otherwise without abnormality. Complications:            No immediate complications. Estimated Blood Loss:     Estimated blood loss was minimal. Impression:               - Diverticulosis in the sigmoid colon.                           - Non-bleeding external and internal hemorrhoids.                           - The examination was otherwise normal.                           - No specimens collected. Recommendation:           - Patient has a contact number available for                            emergencies. The signs and symptoms of potential                            delayed complications were discussed with the                            patient. Return to normal activities tomorrow.                             Written discharge instructions were provided to the                            patient.                           - Resume previous diet.                           - Continue present medications.                           - Repeat colonoscopy in 5 years for surveillance. Mauri Pole, MD 01/14/2022 9:25:05 AM This report has been signed electronically.

## 2022-01-14 NOTE — Progress Notes (Signed)
Pt awake, alert and oriented. VSS. Airway intact. SBAR complete to RN. All questions answered.  

## 2022-01-14 NOTE — Patient Instructions (Addendum)
-   Repeat colonoscopy in 5 years for surveillance.  There were no polyps seen today!  You will need another screening colonoscopy in 5 years, you will receive a letter at that time when you are due for the procedure.   Please call us at [phone] if you have a change in bowel habits, change in family history of colo-rectal cancer, rectal bleeding or other GI concern before that time.   HANDOUT GIVEN FOR DIVERTICULOSIS.  YOU HAD AN ENDOSCOPIC PROCEDURE TODAY AT [facility]:   Refer to the procedure report that was given to you for any specific questions about what was found during the examination.  If the procedure report does not answer your questions, please call your gastroenterologist to clarify.  If you requested that your care partner not be given the details of your procedure findings, then the procedure report has been included in a sealed envelope for you to review at your convenience later.  YOU SHOULD EXPECT: Some feelings of bloating in the abdomen. Passage of more gas than usual.  Walking can help get rid of the air that was put into your GI tract during the procedure and reduce the bloating. If you had a lower endoscopy (such as a colonoscopy or flexible sigmoidoscopy) you may notice spotting of blood in your stool or on the toilet paper. If you underwent a bowel prep for your procedure, you may not have a normal bowel movement for a few days.  Please Note:  You might notice some irritation and congestion in your nose or some drainage.  This is from the oxygen used during your procedure.  There is no need for concern and it should clear up in a day or so.  SYMPTOMS TO REPORT IMMEDIATELY:  Following lower endoscopy (colonoscopy or flexible sigmoidoscopy):  Excessive amounts of blood in the stool  Significant tenderness or worsening of abdominal pains  Swelling of the abdomen that is new, acute  Fever of 100F or higher   For urgent or emergent issues, a  gastroenterologist can be reached at any hour by calling [phone]. Do not use MyChart messaging for urgent concerns.    DIET:  We do recommend a small meal at first, but then you may proceed to your regular diet.  Drink plenty of fluids but you should avoid alcoholic beverages for 24 hours.  ACTIVITY:  You should plan to take it easy for the rest of today and you should NOT DRIVE or use heavy machinery until tomorrow (because of the sedation medicines used during the test).    FOLLOW UP: Our staff will call the number listed on your records the next business day following your procedure.  We will call around 7:15- 8:00 am to check on you and address any questions or concerns that you may have regarding the information given to you following your procedure. If we do not reach you, we will leave a message.      SIGNATURES/CONFIDENTIALITY: You and/or your care partner have signed paperwork which will be entered into your electronic medical record.  These signatures attest to the fact that that the information above on your After Visit Summary has been reviewed and is understood.  Full responsibility of the confidentiality of this discharge information lies with you and/or your care-partner.

## 2022-01-14 NOTE — Progress Notes (Signed)
[facility] History and Physical   Primary Care Physician:  Tammi Sou, MD   Reason for Procedure:  History of adenomatous colon polyps  Plan:    Surveillance colonoscopy with possible interventions as needed     HPI: Kristen Meyers is a very pleasant 51 y.o. female here for surveillance colonoscopy. Denies any nausea, vomiting, abdominal pain, melena or bright red blood per rectum  The risks and benefits as well as alternatives of endoscopic procedure(s) have been discussed and reviewed. All questions answered. The patient agrees to proceed.    Past Medical History:  Diagnosis Date   Acute DVT (deep venous thrombosis) (HCC)    DVT 2012-january, L leg, in the context of post-C section. Again 2021, L leg, dx'd shortly after having covid infection->took eliquis x 67mo   Cholelithiasis without obstruction 09/19/09 (u/s)   COVID-19 virus infection    approx 11/09/19---got antibody infusion   [place] 039/[id number]  Fatty liver 09/19/09 (u/s)   Mildly elevated transaminases   GERD 03/21/2010   History of adenomatous polyp of colon 12/2018   Recall 2023.   History of herpes zoster 03/2012   HYPERLIPIDEMIA 03/21/2010   Intol of simva?   HYPERTENSION 03/21/2010   Obesity, Class II, BMI 35-39.9    Seizures ([place]    [place]- had 2 none since- no current treatment- never dx'd with any seizure d/o    Subclinical hypothyroidism 2021   2021-22->multinod goiter on u/s 01/2021    Past Surgical History:  Procedure Laterality Date   CESAREAN SECTION     2003, 2005   COLONOSCOPY  01/05/2019   polyp ->recall 3 yrs   LAPAROSCOPIC HYSTERECTOMY  02/2014   Ovaries remain (tubes out): done for DUB   TUBAL LIGATION  2011   Dr. GReino Kentis her GYN.   WISDOM TOOTH EXTRACTION      Prior to Admission medications   Medication Sig Start Date End Date Taking? Authorizing Provider  CINNAMON PO Take by mouth. With Milkweed, called Cinnergy    Yes [provider]  Continuous Blood Gluc Sensor (DRuby MISC by Does not apply route. 12/03/21  Yes [provider]  dapagliflozin propanediol (FARXIGA) 10 MG TABS tablet Take 1 tablet (10 mg total) by mouth daily. 06/17/21  Yes McGowen, PAdrian Blackwater MD  enalapril (VASOTEC) 20 MG tablet Take 2 tablets (40 mg total) by mouth daily. 06/17/21  Yes McGowen, PAdrian Blackwater MD  glipiZIDE (GLUCOTROL XL) 10 MG 24 hr tablet TAKE 1 TABLET DAILY WITH BREAKFAST (OFFICE VISIT NEEDED FOR FURTHER REFILLS) 09/23/21  Yes McGowen, PAdrian Blackwater MD  metFORMIN (GLUCOPHAGE) 1000 MG tablet Take 1 tablet (1,000 mg total) by mouth 2 (two) times daily with a meal. 02/18/21  Yes McGowen, PAdrian Blackwater MD  metoprolol succinate (TOPROL-XL) 100 MG 24 hr tablet Take 2 tablets (200 mg total) by mouth daily. Take with or immediately following a meal. 02/18/21  Yes McGowen, PAdrian Blackwater MD  albuterol (VENTOLIN HFA) 108 (90 Base) MCG/ACT inhaler Inhale 2 puffs into the lungs every 6 (six) hours as needed for wheezing or shortness of breath. 11/28/21   McGowen, PAdrian Blackwater MD  insulin glargine-yfgn (SEMGLEE, YFGN,) 100 UNIT/ML Pen 10 U SQ qd Patient not taking: Reported on 12/17/2021 09/04/21   MTammi Sou MD  Insulin Pen Needle (PEN NEEDLES) 32G X 4 MM MISC 1 each by Does not apply route 2 (two) times daily. Patient not taking: Reported on 12/17/2021 09/04/21  McGowen, Adrian Blackwater, MD  rosuvastatin (CRESTOR) 5 MG tablet Take 1 tablet (5 mg total) by mouth daily. Patient not taking: Reported on 12/17/2021 08/23/21   Tammi Sou, MD    Current Outpatient Medications  Medication Sig Dispense Refill   CINNAMON PO Take by mouth. With Milkweed, called Cinnergy     Continuous Blood Gluc Sensor ([place]illips) MISC by Does not apply route.     dapagliflozin propanediol (FARXIGA) 10 MG TABS tablet Take 1 tablet (10 mg total) by mouth daily. 90 tablet 2   enalapril (VASOTEC) 20 MG tablet Take 2 tablets (40 mg total) by mouth  daily. 180 tablet 2   glipiZIDE (GLUCOTROL XL) 10 MG 24 hr tablet TAKE 1 TABLET DAILY WITH BREAKFAST (OFFICE VISIT NEEDED FOR FURTHER REFILLS) 90 tablet 1   metFORMIN (GLUCOPHAGE) 1000 MG tablet Take 1 tablet (1,000 mg total) by mouth 2 (two) times daily with a meal. 180 tablet 3   metoprolol succinate (TOPROL-XL) 100 MG 24 hr tablet Take 2 tablets (200 mg total) by mouth daily. Take with or immediately following a meal. 180 tablet 3   albuterol (VENTOLIN HFA) 108 (90 Base) MCG/ACT inhaler Inhale 2 puffs into the lungs every 6 (six) hours as needed for wheezing or shortness of breath. 8 g 1   insulin glargine-yfgn (SEMGLEE, YFGN,) 100 UNIT/ML Pen 10 U SQ qd (Patient not taking: Reported on 12/17/2021) 15 mL 1   Insulin Pen Needle (PEN NEEDLES) 32G X 4 MM MISC 1 each by Does not apply route 2 (two) times daily. (Patient not taking: Reported on 12/17/2021) 200 each 5   rosuvastatin (CRESTOR) 5 MG tablet Take 1 tablet (5 mg total) by mouth daily. (Patient not taking: Reported on 12/17/2021) 90 tablet 1   Current Facility-Administered Medications  Medication Dose Route Frequency Provider Last Rate Last Admin   0.9 %  sodium chloride infusion  500 mL Intravenous Continuous Maron Stanzione, Venia Minks, MD        Allergies as of 01/14/2022 - Review Complete 01/14/2022  Allergen Reaction Noted   Codeine Hives 04/29/2010    Family History  Problem Relation Age of Onset   Cancer Mother        colon- pt is unsure ever had colon cancer, ovarian, lung, breast- breast primary cancer- dx;d in her 41's- she is still alive 12-2018   Heart disease Mother    Hypertension Mother    Hyperlipidemia Mother    Diabetes Mother        type ll   Breast cancer Mother    Ovarian cancer Mother    Lung cancer Mother    Colon cancer Maternal Uncle    Rectal cancer Neg Hx    Stomach cancer Neg Hx    Colon polyps Neg Hx    Esophageal cancer Neg Hx     Social History   Socioeconomic History   Marital status: Married     Spouse name: Not on file   Number of children: Not on file   Years of education: Not on file   Highest education level: Bachelor's degree (e.g., BA, AB, BS)  Occupational History   Not on file  Tobacco Use   Smoking status: Never   Smokeless tobacco: Never  Vaping Use   Vaping Use: Never used  Substance and Sexual Activity   Alcohol use: Never   Drug use: Never   Sexual activity: Yes    Birth control/protection: Surgical  Other Topics Concern   Not on  file  Social History Narrative   Married, 1 child.   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   Orig from [place], currently living in [place].   No T/A/Ds.   Exercise: 1- 3 days a week, steps + cardio.   Social Determinants of Health   Financial Resource Strain: Low Risk  (11/28/2021)   Overall Financial Resource Strain (CARDIA)    Difficulty of Paying Living Expenses: Not hard at all  Food Insecurity: No Food Insecurity (11/28/2021)   Hunger Vital Sign    Worried About Running Out of Food in the Last Year: Never true    Ran Out of Food in the Last Year: Never true  Transportation Needs: No Transportation Needs (11/28/2021)   PRAPARE - Hydrologist (Medical): No    Lack of Transportation (Non-Medical): No  Physical Activity: Sufficiently Active (11/28/2021)   Exercise Vital Sign    Days of Exercise per Week: 3 days    Minutes of Exercise per Session: 80 min  Stress: Stress Concern Present (11/28/2021)   [place]    Feeling of Stress : Very much  Social Connections: Unknown (11/28/2021)   Social Connection and Isolation Panel [NHANES]    Frequency of Communication with Friends and Family: Patient refused    Frequency of Social Gatherings with Friends and Family: Patient refused    Attends Religious Services: Patient refused    Marine scientist or Organizations: Yes    Attends Theatre manager Meetings: Patient refused    Marital Status: Married  Human resources officer Violence: Not on file    Review of Systems:  All other review of systems negative except as mentioned in the HPI.  Physical Exam: Vital signs in last 24 hours: Blood Pressure (Abnormal) 132/57   Pulse 76   Temperature (Abnormal) 97.1 F (36.2 C) (Temporal)   Height 5' 4.5" (1.638 m)   Weight 221 lb 6.4 oz (100.4 kg)   Last Menstrual Period 11/29/2013   Oxygen Saturation 98%   Body Mass Index 37.42 kg/m  General:   Alert, NAD Lungs:  Clear .   Heart:  Regular rate and rhythm Abdomen:  Soft, nontender and nondistended. Neuro/Psych:  Alert and cooperative. Normal mood and affect. A and O x 3  Reviewed labs, radiology imaging, old records and pertinent past GI work up  Patient is appropriate for planned procedure(s) and anesthesia in an ambulatory setting   K. Denzil Magnuson , MD [phone]

## 2022-01-15 ENCOUNTER — Telehealth: Payer: Self-pay

## 2022-01-15 NOTE — Telephone Encounter (Signed)
  Follow up Call-     01/14/2022    7:50 AM  Call back number  Post procedure Call Back phone  # [phone]  Permission to leave phone message Yes    Follow up call made.  NALM

## 2022-01-22 ENCOUNTER — Encounter: Payer: Self-pay | Admitting: Family Medicine

## 2022-01-22 ENCOUNTER — Ambulatory Visit: Payer: BC Managed Care – PPO | Admitting: Family Medicine

## 2022-01-22 VITALS — BP 117/76 | HR 63 | Temp 97.2°F | Ht 64.5 in | Wt 220.4 lb

## 2022-01-22 DIAGNOSIS — H5712 Ocular pain, left eye: Secondary | ICD-10-CM

## 2022-01-22 NOTE — Progress Notes (Signed)
OFFICE VISIT  01/22/2022  CC:  Chief Complaint  Patient presents with   Eye concern    Foreign object in left eye, used eye drops last night which helped a little. Eye has been watery.    Patient is a 51 y.o. female who presents for left eye discomfort.  HPI: Yesterday after putting her contact in the left eye she noted that she started to feel like there was a foreign body in it. She did have trouble getting the contact in. She feels like something is in the eye when she looks around but she has no problems keeping it open and denies any pain.  No redness of the eye, no drainage.  She has been putting some moisturizing drops and since she felt this yesterday.   Past Medical History:  Diagnosis Date   Acute DVT (deep venous thrombosis) (HCC)    DVT 2012-january, L leg, in the context of post-C section. Again 2021, L leg, dx'd shortly after having covid infection->took eliquis x 58mo   Cholelithiasis without obstruction 09/19/09 (u/s)   COVID-19 virus infection    approx 11/09/19---got antibody infusion   Diabetes mellitus without complication ([facility] [place] 037/62/8315  Fatty liver 09/19/09 (u/s)   Mildly elevated transaminases   GERD 03/21/2010   History of adenomatous polyp of colon 12/2018   2020.  Normal 01/14/22. Recall 5 yrs   History of herpes zoster 03/2012   HYPERLIPIDEMIA 03/21/2010   Intol of simva?   HYPERTENSION 03/21/2010   Obesity, Class II, BMI 35-39.9    [place]izures ([place]    [place]- had 2 none since- no current treatment- never dx'd with any seizure d/o    Subclinical hypothyroidism 2021   2021-22->multinod goiter on u/s 01/2021    Past Surgical History:  Procedure Laterality Date   CESAREAN SECTION     2003, 2005   COLONOSCOPY  01/05/2019   2020 polyps.  01/14/22 no polyps. Recall 5 yrs.   LAPAROSCOPIC HYSTERECTOMY  02/2014   Ovaries remain (tubes out): done for DUB   TUBAL LIGATION  2011   Dr. GReino Kentis her GYN.   WISDOM TOOTH EXTRACTION      Outpatient  Medications Prior to Visit  Medication Sig Dispense Refill   CINNAMON PO Take by mouth. With Milkweed, called Cinnergy     Continuous Blood Gluc Sensor (DElgin MISC by Does not apply route.     dapagliflozin propanediol (FARXIGA) 10 MG TABS tablet Take 1 tablet (10 mg total) by mouth daily. 90 tablet 2   enalapril (VASOTEC) 20 MG tablet Take 2 tablets (40 mg total) by mouth daily. 180 tablet 2   glipiZIDE (GLUCOTROL XL) 10 MG 24 hr tablet TAKE 1 TABLET DAILY WITH BREAKFAST (OFFICE VISIT NEEDED FOR FURTHER REFILLS) 90 tablet 1   insulin glargine-yfgn (SEMGLEE, YFGN,) 100 UNIT/ML Pen 10 U SQ qd 15 mL 1   Insulin Pen Needle (PEN NEEDLES) 32G X 4 MM MISC 1 each by Does not apply route 2 (two) times daily. 200 each 5   metFORMIN (GLUCOPHAGE) 1000 MG tablet Take 1 tablet (1,000 mg total) by mouth 2 (two) times daily with a meal. 180 tablet 3   metoprolol succinate (TOPROL-XL) 100 MG 24 hr tablet Take 2 tablets (200 mg total) by mouth daily. Take with or immediately following a meal. 180 tablet 3   rosuvastatin (CRESTOR) 5 MG tablet Take 1 tablet (5 mg total) by mouth daily. 90 tablet 1   No facility-administered medications prior to  visit.    Allergies  Allergen Reactions   Codeine Hives    ROS As per HPI  PE:    01/22/2022   11:04 AM 01/14/2022    9:47 AM 01/14/2022    9:37 AM  Vitals with BMI  Height 5' 4.5"    Weight 220 lbs 6 oz    BMI 21.22    Systolic 482 500 370  Diastolic 76 75 69  Pulse 63 61 70     Physical Exam  Gen: Alert, well appearing.  Patient is oriented to person, place, time, and situation. AFFECT: pleasant, lucid thought and speech. Left eye: No injection or swelling.  Extraocular movements intact.  PERRL. Lids normal.  Lashes normal.  No foreign body in the eye.  LABS:  Last CBC Lab Results  Component Value Date   WBC 7.9 09/24/2020   HGB 14.1 09/24/2020   HCT 42.3 09/24/2020   MCV 86.7 09/24/2020   MCH 28.5 10/10/2009   RDW 15.1  09/24/2020   PLT 242.0 48/88/9169   Last metabolic panel Lab Results  Component Value Date   GLUCOSE 128 (H) 08/23/2021   NA 138 08/23/2021   K 4.1 08/23/2021   CL 107 08/23/2021   CO2 24 08/23/2021   BUN 17 08/23/2021   CREATININE 0.75 08/23/2021   CALCIUM 9.0 08/23/2021   PROT 6.7 08/23/2021   ALBUMIN 4.4 09/24/2020   BILITOT 0.4 08/23/2021   ALKPHOS 60 09/24/2020   AST 16 08/23/2021   ALT 23 08/23/2021   Last hemoglobin A1c Lab Results  Component Value Date   HGBA1C 8.0 10/10/2021   IMPRESSION AND PLAN:  Left eye discomfort.  Suspect she sustained a slight scleral abrasion with putting her contact in yesterday. Reassured patient no foreign body seen. Continue over-the-counter moisturizing eyedrops.  Do not put contact back in until she has been completely without eye discomfort for at least 24 hours.  An After Visit Summary was printed and given to the patient.  FOLLOW UP: No follow-ups on file.  Signed:  Crissie Sickles, MD           01/22/2022

## 2022-03-04 DIAGNOSIS — E1165 Type 2 diabetes mellitus with hyperglycemia: Secondary | ICD-10-CM | POA: Diagnosis not present

## 2022-03-24 ENCOUNTER — Ambulatory Visit: Payer: BC Managed Care – PPO | Admitting: Family Medicine

## 2022-03-24 ENCOUNTER — Encounter: Payer: Self-pay | Admitting: Family Medicine

## 2022-03-24 VITALS — BP 122/78 | HR 64 | Temp 98.1°F | Ht 64.5 in | Wt 222.4 lb

## 2022-03-24 DIAGNOSIS — M222X2 Patellofemoral disorders, left knee: Secondary | ICD-10-CM | POA: Diagnosis not present

## 2022-03-24 DIAGNOSIS — M79672 Pain in left foot: Secondary | ICD-10-CM | POA: Diagnosis not present

## 2022-03-24 DIAGNOSIS — S86912A Strain of unspecified muscle(s) and tendon(s) at lower leg level, left leg, initial encounter: Secondary | ICD-10-CM | POA: Diagnosis not present

## 2022-03-24 DIAGNOSIS — M25562 Pain in left knee: Secondary | ICD-10-CM

## 2022-03-24 DIAGNOSIS — M7662 Achilles tendinitis, left leg: Secondary | ICD-10-CM

## 2022-03-24 NOTE — Progress Notes (Signed)
OFFICE VISIT  03/24/2022  CC:  Chief Complaint  Patient presents with   Knee Pain    Left knee swelling and left foot pain; has gotten worse in the last 3-4 weeks. Ibuprofen and extra strength tylenol, using ice, wear a support brace when playing tennis. Pain level is 2/10    Patient is a 52 y.o. female who presents for knee and foot pain.  HPI: 3 to 4 weeks of left knee pain, "feels like it jams" when she is running on it.  Anterior and medial aspect of left knee. Has been playing more hardcourt tennis lately than she usually does.  She does note it swells up some intermittently.  No locking or buckling. If she rests it gets a lot better.  Also during this time has noted worsening pain in the back of the left heel. Has not heard any pop or snap from the area.   Past Medical History:  Diagnosis Date   Acute DVT (deep venous thrombosis) (HCC)    DVT 2012-january, L leg, in the context of post-C section. Again 2021, L leg, dx'd shortly after having covid infection->took eliquis x 9mo   Cholelithiasis without obstruction 09/19/09 (u/s)   COVID-19 virus infection    approx 11/09/19---got antibody infusion   Diabetes mellitus without complication ([place] 083/38/2505  Fatty liver 09/19/09 (u/s)   Mildly elevated transaminases   GERD 03/21/2010   History of adenomatous polyp of colon 12/2018   2020.  Normal 01/14/22. Recall 5 yrs   History of herpes zoster 03/2012   HYPERLIPIDEMIA 03/21/2010   Intol of simva?   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  01/14/22 no polyps. Recall 5 yrs.   LAPAROSCOPIC HYSTERECTOMY  02/2014   Ovaries remain (tubes out): done for  DUB   TUBAL LIGATION  2011   Dr. GReino Kentis her GYN.   WISDOM TOOTH EXTRACTION      Outpatient Medications Prior to Visit  Medication Sig Dispense Refill   CINNAMON PO Take by mouth. With Milkweed, called Cinnergy     Continuous Blood Gluc Sensor ([place] MISC by Does not apply route.     dapagliflozin propanediol (FARXIGA) 10 MG TABS tablet Take 1 tablet (10 mg total) by mouth daily. 90 tablet 2   enalapril (VASOTEC) 20 MG tablet Take 2 tablets (40 mg total) by mouth daily. 180 tablet 2   glipiZIDE (GLUCOTROL XL) 10 MG 24 hr tablet TAKE 1 TABLET DAILY WITH BREAKFAST (OFFICE VISIT NEEDED FOR FURTHER REFILLS) 90 tablet 1   insulin glargine-yfgn (SEMGLEE, YFGN,) 100 UNIT/ML Pen 10 U SQ qd (Patient taking differently: Inject 24 Units into the skin. 10 U SQ qd) 15 mL 1   Insulin Pen Needle (PEN NEEDLES) 32G X 4 MM MISC 1 each by Does not apply route 2 (two) times daily. 200 each 5   metFORMIN (GLUCOPHAGE) 1000 MG tablet Take 1 tablet (1,000 mg total) by mouth 2 (two) times daily with a meal. 180 tablet 3   metoprolol succinate (TOPROL-XL) 100 MG 24 hr tablet Take 2 tablets (200 mg total) by mouth daily.  Take with or immediately following a meal. 180 tablet 3   rosuvastatin (CRESTOR) 5 MG tablet Take 1 tablet (5 mg total) by mouth daily. 90 tablet 1   No facility-administered medications prior to visit.    Allergies  Allergen Reactions   Codeine Hives    Review of Systems  As per HPI  PE:    03/24/2022    8:12 AM 01/22/2022   11:04 AM 01/14/2022    9:47 AM  Vitals with BMI  Height 5' 4.5" 5' 4.5"   Weight 222 lbs 6 oz 220 lbs 6 oz   BMI 68.3 41.96   Systolic 222 979 892  Diastolic 78 76 75  Pulse 64 63 61     Physical Exam  Neuro: Alert and well-appearing. Left knee without erythema or swelling. With some tenderness to palpation in the peripatellar region as well as medial joint line.  Also some tenderness over the pes anserine region. Full active and passive range  of motion, no instability.  Left heel with some tenderness to palpation over the lower quarter of Achilles tendon extending down to its insertion on the Achilles. There is a palpable nodular region of her tendon just proximal to its insertion. Bedside ultrasound today: She has distal Achilles calcification and hypoechoic changes as well as a fusiform area of significant increased tendon thickness.  No tears.  Spur present at Achilles insertion.  LABS:  Last metabolic panel Lab Results  Component Value Date   GLUCOSE 128 (H) 08/23/2021   NA 138 08/23/2021   K 4.1 08/23/2021   CL 107 08/23/2021   CO2 24 08/23/2021   BUN 17 08/23/2021   CREATININE 0.75 08/23/2021   CALCIUM 9.0 08/23/2021   PROT 6.7 08/23/2021   ALBUMIN 4.4 09/24/2020   BILITOT 0.4 08/23/2021   ALKPHOS 60 09/24/2020   AST 16 08/23/2021   ALT 23 08/23/2021   Last hemoglobin A1c Lab Results  Component Value Date   HGBA1C 8.0 10/10/2021   IMPRESSION AND PLAN:  #1 left knee strain.  Likely a component of osteoarthritis and pes anserine strain/bursitis as well. Recommended relative rest, ice, and recommended a course of physical therapy. Ordered today.  2.  Left Achilles tendinopathy. Recommended relative rest, physical therapy.  Ordered today.  An After Visit Summary was printed and given to the patient.  FOLLOW UP: Return in about 6 weeks (around 05/05/2022) for Follow-up knee pain and heel pain.  Signed:  Crissie Sickles, MD           03/24/2022

## 2022-04-04 DIAGNOSIS — M25561 Pain in right knee: Secondary | ICD-10-CM | POA: Diagnosis not present

## 2022-04-04 DIAGNOSIS — M25562 Pain in left knee: Secondary | ICD-10-CM | POA: Diagnosis not present

## 2022-04-04 DIAGNOSIS — M7662 Achilles tendinitis, left leg: Secondary | ICD-10-CM | POA: Diagnosis not present

## 2022-04-07 DIAGNOSIS — M25561 Pain in right knee: Secondary | ICD-10-CM | POA: Diagnosis not present

## 2022-04-07 DIAGNOSIS — M7662 Achilles tendinitis, left leg: Secondary | ICD-10-CM | POA: Diagnosis not present

## 2022-04-07 DIAGNOSIS — M25562 Pain in left knee: Secondary | ICD-10-CM | POA: Diagnosis not present

## 2022-04-10 DIAGNOSIS — M7662 Achilles tendinitis, left leg: Secondary | ICD-10-CM | POA: Diagnosis not present

## 2022-04-10 DIAGNOSIS — M25562 Pain in left knee: Secondary | ICD-10-CM | POA: Diagnosis not present

## 2022-04-10 DIAGNOSIS — M25561 Pain in right knee: Secondary | ICD-10-CM | POA: Diagnosis not present

## 2022-04-16 DIAGNOSIS — M25561 Pain in right knee: Secondary | ICD-10-CM | POA: Diagnosis not present

## 2022-04-16 DIAGNOSIS — M25562 Pain in left knee: Secondary | ICD-10-CM | POA: Diagnosis not present

## 2022-04-16 DIAGNOSIS — M7662 Achilles tendinitis, left leg: Secondary | ICD-10-CM | POA: Diagnosis not present

## 2022-04-18 DIAGNOSIS — M25561 Pain in right knee: Secondary | ICD-10-CM | POA: Diagnosis not present

## 2022-04-18 DIAGNOSIS — M25562 Pain in left knee: Secondary | ICD-10-CM | POA: Diagnosis not present

## 2022-04-18 DIAGNOSIS — M7662 Achilles tendinitis, left leg: Secondary | ICD-10-CM | POA: Diagnosis not present

## 2022-04-21 DIAGNOSIS — M25561 Pain in right knee: Secondary | ICD-10-CM | POA: Diagnosis not present

## 2022-04-21 DIAGNOSIS — M7662 Achilles tendinitis, left leg: Secondary | ICD-10-CM | POA: Diagnosis not present

## 2022-04-21 DIAGNOSIS — M25562 Pain in left knee: Secondary | ICD-10-CM | POA: Diagnosis not present

## 2022-04-24 DIAGNOSIS — M25562 Pain in left knee: Secondary | ICD-10-CM | POA: Diagnosis not present

## 2022-04-24 DIAGNOSIS — M7662 Achilles tendinitis, left leg: Secondary | ICD-10-CM | POA: Diagnosis not present

## 2022-04-24 DIAGNOSIS — M25561 Pain in right knee: Secondary | ICD-10-CM | POA: Diagnosis not present

## 2022-04-28 DIAGNOSIS — M7662 Achilles tendinitis, left leg: Secondary | ICD-10-CM | POA: Diagnosis not present

## 2022-04-28 DIAGNOSIS — M25562 Pain in left knee: Secondary | ICD-10-CM | POA: Diagnosis not present

## 2022-04-28 DIAGNOSIS — M25561 Pain in right knee: Secondary | ICD-10-CM | POA: Diagnosis not present

## 2022-05-01 DIAGNOSIS — M7662 Achilles tendinitis, left leg: Secondary | ICD-10-CM | POA: Diagnosis not present

## 2022-05-01 DIAGNOSIS — M25561 Pain in right knee: Secondary | ICD-10-CM | POA: Diagnosis not present

## 2022-05-01 DIAGNOSIS — M25562 Pain in left knee: Secondary | ICD-10-CM | POA: Diagnosis not present

## 2022-05-02 ENCOUNTER — Other Ambulatory Visit: Payer: Self-pay | Admitting: Family Medicine

## 2022-05-13 DIAGNOSIS — M25561 Pain in right knee: Secondary | ICD-10-CM | POA: Diagnosis not present

## 2022-05-13 DIAGNOSIS — M7662 Achilles tendinitis, left leg: Secondary | ICD-10-CM | POA: Diagnosis not present

## 2022-05-13 DIAGNOSIS — M25562 Pain in left knee: Secondary | ICD-10-CM | POA: Diagnosis not present

## 2022-05-14 ENCOUNTER — Encounter: Payer: Self-pay | Admitting: Family Medicine

## 2022-05-14 ENCOUNTER — Ambulatory Visit (INDEPENDENT_AMBULATORY_CARE_PROVIDER_SITE_OTHER): Payer: BC Managed Care – PPO | Admitting: Family Medicine

## 2022-05-14 VITALS — BP 134/84 | HR 66 | Temp 98.0°F | Ht 64.5 in | Wt 227.4 lb

## 2022-05-14 DIAGNOSIS — E7800 Pure hypercholesterolemia, unspecified: Secondary | ICD-10-CM

## 2022-05-14 DIAGNOSIS — E038 Other specified hypothyroidism: Secondary | ICD-10-CM

## 2022-05-14 DIAGNOSIS — Z0000 Encounter for general adult medical examination without abnormal findings: Secondary | ICD-10-CM

## 2022-05-14 DIAGNOSIS — E119 Type 2 diabetes mellitus without complications: Secondary | ICD-10-CM | POA: Diagnosis not present

## 2022-05-14 DIAGNOSIS — I10 Essential (primary) hypertension: Secondary | ICD-10-CM

## 2022-05-14 LAB — LIPID PANEL
Cholesterol: 227 mg/dL — ABNORMAL HIGH (ref 0–200)
HDL: 53.2 mg/dL (ref >39.00)
LDL Cholesterol: 136 mg/dL — ABNORMAL HIGH (ref 0–99)
NonHDL: 173.46
Total CHOL/HDL Ratio: 4
Triglycerides: 186 mg/dL — ABNORMAL HIGH (ref 0.0–149.0)
VLDL: 37.2 mg/dL (ref 0.0–40.0)

## 2022-05-14 LAB — CBC
HCT: 42.9 % (ref 36.0–46.0)
Hemoglobin: 13.9 g/dL (ref 12.0–15.0)
MCHC: 32.4 g/dL (ref 30.0–36.0)
MCV: 85.4 fl (ref 78.0–100.0)
Platelets: 234 10*3/uL (ref 150.0–400.0)
RBC: 5.02 Mil/uL (ref 3.87–5.11)
RDW: 14.8 % (ref 11.5–15.5)
WBC: 4.4 10*3/uL (ref 4.0–10.5)

## 2022-05-14 LAB — COMPREHENSIVE METABOLIC PANEL
ALT: 25 U/L (ref 0–35)
AST: 15 U/L (ref 0–37)
Albumin: 4.1 g/dL (ref 3.5–5.2)
Alkaline Phosphatase: 84 U/L (ref 39–117)
BUN: 22 mg/dL (ref 6–23)
CO2: 29 mEq/L (ref 19–32)
Calcium: 9.8 mg/dL (ref 8.4–10.5)
Chloride: 100 mEq/L (ref 96–112)
Creatinine, Ser: 0.7 mg/dL (ref 0.40–1.20)
GFR: 98.78 mL/min (ref >60.00)
Glucose, Bld: 210 mg/dL — ABNORMAL HIGH (ref 70–99)
Potassium: 4.5 mEq/L (ref 3.5–5.1)
Sodium: 137 mEq/L (ref 135–145)
Total Bilirubin: 0.4 mg/dL (ref 0.2–1.2)
Total Protein: 6.6 g/dL (ref 6.0–8.3)

## 2022-05-14 LAB — T4, FREE: Free T4: 0.68 ng/dL (ref 0.60–1.60)

## 2022-05-14 LAB — MICROALBUMIN / CREATININE URINE RATIO
Creatinine,U: 136.7 mg/dL
Microalb Creat Ratio: 2.1 mg/g (ref 0.0–30.0)
Microalb, Ur: 2.8 mg/dL — ABNORMAL HIGH (ref 0.0–1.9)

## 2022-05-14 LAB — TSH: TSH: 4.85 u[IU]/mL (ref 0.35–5.50)

## 2022-05-14 MED ORDER — METOPROLOL SUCCINATE ER 100 MG PO TB24: 200.0000 mg | ORAL_TABLET | Freq: Every day | ORAL | 1 refills | Status: DC

## 2022-05-14 MED ORDER — ROSUVASTATIN CALCIUM 5 MG PO TABS: 5.0000 mg | ORAL_TABLET | Freq: Every day | ORAL | 1 refills | Status: DC

## 2022-05-14 MED ORDER — ENALAPRIL MALEATE 20 MG PO TABS: 40.0000 mg | ORAL_TABLET | Freq: Every day | ORAL | 1 refills | Status: DC

## 2022-05-14 NOTE — Patient Instructions (Signed)

## 2022-05-14 NOTE — Progress Notes (Signed)
Office Note 05/14/2022  CC:  Chief Complaint  Patient presents with   Medical Management of Chronic Issues    HPI:  Patient is a 52 y.o. female who is here for annual health maintenance exam and follow-up hypertension and hyperlipidemia. Her diabetes is now managed by Dr. Steffanie Dunn with [facility].  Office visit 03/04/2022 reviewed--A1c was 8.1%.  Semglee was increased to 27 units every morning.  She was continued on Farxiga 10 mg a day, metformin 1000 mg twice a day, and glipizide 10 mg/day.  Currently she has no acute complaints.   Past Medical History:  Diagnosis Date   Acute DVT (deep venous thrombosis) (HCC)    DVT 2012-january, L leg, in the context of post-C section. Again 2021, L leg, dx'd shortly after having covid infection->took eliquis x 79mo   Cholelithiasis without obstruction 09/19/09 (u/s)   COVID-19 virus infection    approx 11/09/19---got antibody infusion   Diabetes mellitus without complication ([place] [id number]  Fatty liver 09/19/09 (u/s)   Mildly elevated transaminases   GERD 03/21/2010   History of adenomatous polyp of colon 12/2018   2020.  Normal 01/14/22. Recall 5 yrs   History of herpes zoster 03/2012   HYPERLIPIDEMIA 03/21/2010   Intol of simva?   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  01/14/22 no polyps. Recall 5 yrs.   LAPAROSCOPIC HYSTERECTOMY  02/2014   Ovaries remain (tubes out): done for DUB   TUBAL LIGATION  2011   Dr. GReino Kentis her GYN.   WISDOM TOOTH EXTRACTION      Family History  Problem Relation Age of Onset   Cancer Mother        colon- pt is unsure ever had colon cancer, ovarian, lung, breast- breast  primary cancer- dx;d in her 551's she is still alive 12-2018   Heart disease Mother    Hypertension Mother    Hyperlipidemia Mother    Diabetes Mother        type ll   Breast cancer Mother    Ovarian cancer Mother    Lung cancer Mother    Colon cancer Maternal Uncle    Rectal cancer Neg Hx    Stomach cancer Neg Hx    Colon polyps Neg Hx    Esophageal cancer Neg Hx     Social History   Socioeconomic History   Marital status: Married    Spouse name: Not on file   Number of children: Not on file   Years of education: Not on file   Highest education level: Bachelor's degree (e.g., BA, AB, BS)  Occupational History   Not on file  Tobacco Use   Smoking status: Never   Smokeless tobacco: Never  Vaping Use   Vaping Use: Never used  Substance and Sexual Activity   Alcohol use: Never   Drug use: Never   Sexual activity: Yes    Birth control/protection: Surgical  Other Topics Concern   Not on file  Social History Narrative   Married, 1 child.   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   No T/A/Ds.   Exercise: 1- 3 days a week, steps + cardio.   Social Determinants of Health   Financial Resource Strain: Low Risk  (11/28/2021)   Overall Financial Resource Strain (CARDIA)    Difficulty of Paying Living Expenses: Not hard at all  Food Insecurity: No Food Insecurity (11/28/2021)   Hunger Vital Sign    Worried About Running Out of Food in the Last Year: Never true    Ran Out of Food in the Last Year: Never true  Transportation Needs: No Transportation Needs (11/28/2021)   PRAPARE - Hydrologist (Medical): No    Lack of Transportation (Non-Medical): No  Physical Activity: Sufficiently Active (11/28/2021)   Exercise Vital Sign    Days of Exercise per Week: 3 days    Minutes of Exercise per Session: 80 min  Stress: Stress Concern Present (11/28/2021)   [place]    Feeling of Stress : Very much  Social Connections: Unknown (11/28/2021)   Social Connection and Isolation Panel [NHANES]    Frequency of Communication with Friends and Family: Patient refused    Frequency of Social Gatherings with Friends and Family: Patient refused    Attends Religious Services: Patient refused    Marine scientist or Organizations: Yes    Attends Archivist Meetings: Patient refused    Marital Status: Married  Human resources officer Violence: Not on file    Outpatient Medications Prior to Visit  Medication Sig Dispense Refill   CINNAMON PO Take by mouth. With Milkweed, called Cinnergy     Continuous Blood Gluc Sensor ([place]) MISC by Does not apply route.     dapagliflozin propanediol (FARXIGA) 10 MG TABS tablet Take 1 tablet (10 mg total) by mouth daily. 90 tablet 2   glipiZIDE (GLUCOTROL XL) 10 MG 24 hr tablet TAKE 1 TABLET DAILY WITH BREAKFAST (OFFICE VISIT NEEDED FOR FURTHER REFILLS) 90 tablet 1   insulin glargine-yfgn (SEMGLEE, YFGN,) 100 UNIT/ML Pen 10 U SQ qd (Patient taking differently: Inject 28 Units into the skin. 10 U SQ qd) 15 mL 1   Insulin Pen Needle (PEN NEEDLES) 32G X 4 MM MISC 1 each by Does not apply route 2 (two) times daily. 200 each 5   metFORMIN (GLUCOPHAGE) 1000 MG tablet Take 1 tablet (1,000 mg total) by mouth 2 (two) times daily with a meal. 180 tablet 3   enalapril (VASOTEC) 20 MG tablet Take 2 tablets (40 mg total) by mouth daily. 180 tablet 2   metoprolol succinate (TOPROL-XL) 100 MG 24 hr tablet Take 2 tablets (200 mg total) by mouth daily. Take with or immediately following a meal. 180 tablet 3   rosuvastatin (CRESTOR) 5 MG tablet Take 1 tablet (5 mg total) by mouth daily. 90 tablet 1   No facility-administered medications prior to visit.    Allergies  Allergen Reactions   Codeine Hives    Review of Systems  Constitutional:  Negative for  appetite change, chills, fatigue and fever.  HENT:  Negative for congestion, dental problem, ear pain and sore throat.   Eyes:  Negative for discharge, redness and visual disturbance.  Respiratory:  Negative for cough, chest tightness, shortness of breath and wheezing.   Cardiovascular:  Negative for chest pain, palpitations and leg swelling.  Gastrointestinal:  Negative for abdominal pain, blood in stool, diarrhea, nausea and vomiting.  Genitourinary:  Negative for difficulty urinating, dysuria,  flank pain, frequency, hematuria and urgency.  Musculoskeletal:  Negative for arthralgias, back pain, joint swelling, myalgias and neck stiffness.  Skin:  Negative for pallor and rash.  Neurological:  Negative for dizziness, speech difficulty, weakness and headaches.  Hematological:  Negative for adenopathy. Does not bruise/bleed easily.  Psychiatric/Behavioral:  Negative for confusion and sleep disturbance. The patient is not nervous/anxious.     PE;    05/14/2022    8:09 AM 03/24/2022    8:12 AM 01/22/2022   11:04 AM  Vitals with BMI  Height 5' 4.5" 5' 4.5" 5' 4.5"  Weight 227 lbs 6 oz 222 lbs 6 oz 220 lbs 6 oz  BMI 38.44 [id number]  Systolic [id number]  Diastolic 84 78 76  Pulse 66 64 63  Exam chaperoned by Deveron Furlong, CMA.  Gen: Alert, well appearing.  Patient is oriented to person, place, time, and situation. AFFECT: pleasant, lucid thought and speech. ENT: Ears: EACs clear, normal epithelium.  TMs with good light reflex and landmarks bilaterally.  Eyes: no injection, icteris, swelling, or exudate.  EOMI, PERRLA. Nose: no drainage or turbinate edema/swelling.  No injection or focal lesion.  Mouth: lips without lesion/swelling.  Oral mucosa pink and moist.  Dentition intact and without obvious caries or gingival swelling.  Oropharynx without erythema, exudate, or swelling.  Neck: supple/nontender.  No LAD, mass, or TM.  Carotid pulses 2+ bilaterally, without bruits. CV: RRR, no  m/r/g.   LUNGS: CTA bilat, nonlabored resps, good aeration in all lung fields. ABD: soft, NT, ND, BS normal.  No hepatospenomegaly or mass.  No bruits. EXT: no clubbing, cyanosis, or edema.  Musculoskeletal: no joint swelling, erythema, warmth, or tenderness.  ROM of all joints intact. Skin - no sores or suspicious lesions or rashes or color changes Foot exam -  no swelling, tenderness or skin or vascular lesions. Color and temperature is normal. Sensation is intact. Peripheral pulses are palpable. Toenails are normal.   Pertinent labs:  Lab Results  Component Value Date   TSH 3.12 01/02/2021   Lab Results  Component Value Date   WBC 7.9 09/24/2020   HGB 14.1 09/24/2020   HCT 42.3 09/24/2020   MCV 86.7 09/24/2020   PLT 242.0 09/24/2020   Lab Results  Component Value Date   CREATININE 0.75 08/23/2021   BUN 17 08/23/2021   NA 138 08/23/2021   K 4.1 08/23/2021   CL 107 08/23/2021   CO2 24 08/23/2021   Lab Results  Component Value Date   ALT 23 08/23/2021   AST 16 08/23/2021   ALKPHOS 60 09/24/2020   BILITOT 0.4 08/23/2021   Lab Results  Component Value Date   CHOL 230 (H) 01/02/2021   Lab Results  Component Value Date   HDL 45.20 01/02/2021   Lab Results  Component Value Date   LDLCALC 151 (H) 01/02/2021   Lab Results  Component Value Date   TRIG 170.0 (H) 01/02/2021   Lab Results  Component Value Date   CHOLHDL 5 01/02/2021   Lab Results  Component Value Date   HGBA1C 8.0 10/10/2021   ASSESSMENT AND PLAN:   1) Health maintenance exam: Reviewed age and gender appropriate health maintenance issues (prudent diet, regular exercise, health risks of tobacco and excessive alcohol, use of seatbelts, fire alarms in home, use of sunscreen).  Also reviewed age and gender appropriate health screening as well as vaccine recommendations. Vaccines: Prevnar 20->declined.  Shingrix->declined.  Labs: bmet, lipids, thyroid panel (subclin hypoth). Cervical  ca screening:  per Dr. Gaetano Net, GYN.  Pt with hx of hysterectomy for benign dx. Breast ca screening: mammogram UTD via GYN (Dr. Gaetano Net). Colon ca screening: recall 12/2026.  #2 hypertension, well-controlled on enalapril 20 mg tabs, 2/day and Toprol-XL 100 mg tabs, 2/day. Electrolytes and creatinine today.  3.  Hypercholesterolemia, doing well on Crestor 5 mg a day.  4.  DM followed by endo.  Currently on Semglee, glipizide XL, metformin, and [place]. Feet exam normal today. Urine microalbumin/creatinine today.  An After Visit Summary was printed and given to the patient.  FOLLOW UP:  Return in about 6 months (around 11/12/2022) for routine chronic illness f/u.  Signed:  Crissie Sickles, MD           05/14/2022

## 2022-05-15 ENCOUNTER — Other Ambulatory Visit: Payer: Self-pay | Admitting: Family Medicine

## 2022-05-15 LAB — T3: T3, Total: 138 ng/dL (ref 76–181)

## 2022-05-15 MED ORDER — ROSUVASTATIN CALCIUM 10 MG PO TABS: 10.0000 mg | ORAL_TABLET | Freq: Every day | ORAL | 2 refills | Status: DC

## 2022-05-17 ENCOUNTER — Other Ambulatory Visit: Payer: Self-pay | Admitting: Family Medicine

## 2022-06-04 DIAGNOSIS — Z1330 Encounter for screening examination for mental health and behavioral disorders, unspecified: Secondary | ICD-10-CM | POA: Diagnosis not present

## 2022-06-04 DIAGNOSIS — E1165 Type 2 diabetes mellitus with hyperglycemia: Secondary | ICD-10-CM | POA: Diagnosis not present

## 2022-06-04 LAB — HEMOGLOBIN A1C: Hemoglobin A1C: 7.7

## 2022-09-04 DIAGNOSIS — E1165 Type 2 diabetes mellitus with hyperglycemia: Secondary | ICD-10-CM | POA: Diagnosis not present

## 2022-09-04 LAB — HEMOGLOBIN A1C: Hemoglobin A1C: 7.3

## 2022-09-08 ENCOUNTER — Other Ambulatory Visit: Payer: Self-pay | Admitting: Family Medicine

## 2022-09-15 ENCOUNTER — Encounter (HOSPITAL_BASED_OUTPATIENT_CLINIC_OR_DEPARTMENT_OTHER): Payer: Self-pay | Admitting: Urology

## 2022-09-15 ENCOUNTER — Emergency Department (HOSPITAL_BASED_OUTPATIENT_CLINIC_OR_DEPARTMENT_OTHER): Payer: BC Managed Care – PPO

## 2022-09-15 ENCOUNTER — Emergency Department (HOSPITAL_BASED_OUTPATIENT_CLINIC_OR_DEPARTMENT_OTHER)
Admission: EM | Admit: 2022-09-15 | Discharge: 2022-09-15 | Disposition: A | Discharge status: Home / Self Care | Payer: BC Managed Care – PPO | Attending: Emergency Medicine | Admitting: Emergency Medicine

## 2022-09-15 DIAGNOSIS — E119 Type 2 diabetes mellitus without complications: Secondary | ICD-10-CM | POA: Diagnosis not present

## 2022-09-15 DIAGNOSIS — R531 Weakness: Secondary | ICD-10-CM | POA: Diagnosis not present

## 2022-09-15 DIAGNOSIS — R002 Palpitations: Secondary | ICD-10-CM | POA: Diagnosis not present

## 2022-09-15 DIAGNOSIS — Z7984 Long term (current) use of oral hypoglycemic drugs: Secondary | ICD-10-CM | POA: Insufficient documentation

## 2022-09-15 DIAGNOSIS — I10 Essential (primary) hypertension: Secondary | ICD-10-CM | POA: Diagnosis not present

## 2022-09-15 DIAGNOSIS — Z794 Long term (current) use of insulin: Secondary | ICD-10-CM | POA: Diagnosis not present

## 2022-09-15 LAB — URINALYSIS, ROUTINE W REFLEX MICROSCOPIC
Bilirubin Urine: NEGATIVE
Glucose, UA: >=500 mg/dL — AB
Hgb urine dipstick: NEGATIVE
Ketones, ur: 15 mg/dL — AB
Leukocytes,Ua: NEGATIVE
Nitrite: NEGATIVE
Protein, ur: NEGATIVE mg/dL
Specific Gravity, Urine: 1.015 (ref 1.005–1.030)
pH: 5 (ref 5.0–8.0)

## 2022-09-15 LAB — BASIC METABOLIC PANEL
Anion gap: 12 (ref 5–15)
BUN: 15 mg/dL (ref 6–20)
CO2: 21 mmol/L — ABNORMAL LOW (ref 22–32)
Calcium: 9.3 mg/dL (ref 8.9–10.3)
Chloride: 102 mmol/L (ref 98–111)
Creatinine, Ser: 0.75 mg/dL (ref 0.44–1.00)
GFR, Estimated: >60 mL/min (ref >60)
Glucose, Bld: 133 mg/dL — ABNORMAL HIGH (ref 70–99)
Potassium: 3.9 mmol/L (ref 3.5–5.1)
Sodium: 135 mmol/L (ref 135–145)

## 2022-09-15 LAB — URINALYSIS, MICROSCOPIC (REFLEX)

## 2022-09-15 LAB — CBC
HCT: 43.9 % (ref 36.0–46.0)
Hemoglobin: 14.3 g/dL (ref 12.0–15.0)
MCH: 28.1 pg (ref 26.0–34.0)
MCHC: 32.6 g/dL (ref 30.0–36.0)
MCV: 86.2 fL (ref 80.0–100.0)
Platelets: 260 10*3/uL (ref 150–400)
RBC: 5.09 MIL/uL (ref 3.87–5.11)
RDW: 13.3 % (ref 11.5–15.5)
WBC: 6 10*3/uL (ref 4.0–10.5)
nRBC: 0 % (ref 0.0–0.2)

## 2022-09-15 LAB — MAGNESIUM: Magnesium: 2.1 mg/dL (ref 1.7–2.4)

## 2022-09-15 LAB — TROPONIN I (HIGH SENSITIVITY): Troponin I (High Sensitivity): <2 ng/L (ref <18)

## 2022-09-15 NOTE — ED Notes (Signed)
Pt ambulatory to restroom

## 2022-09-15 NOTE — ED Triage Notes (Signed)
Pt states her fitbit went off and states her HR dropped to 50 and she started feeling weak and dizzy with palpitations x 2 days   H/o HTN

## 2022-09-15 NOTE — Discharge Instructions (Addendum)
You have been seen today for your complaint of palpitations. Your lab work was reassuring. Your imaging was reassuring. Home care instructions are as follows:  Discontinue your supplement use, decreased activity without discontinue activity, decreased caffeine use, eat and drink normal diet Follow up with: Cardiology.  You should receive a call within the next 3 business days.  If you do not, you should call the number listed in this packet. You should also follow-up with your primary care provider regarding your blood pressure. Please seek immediate medical care if you develop any of the following symptoms: You have chest pain or shortness of breath. You have a severe headache. You feel dizzy or you faint. At this time there does not appear to be the presence of an emergent medical condition, however there is always the potential for conditions to change. Please read and follow the below instructions.  Do not take your medicine if  develop an itchy rash, swelling in your mouth or lips, or difficulty breathing; call 911 and seek immediate emergency medical attention if this occurs.  You may review your lab tests and imaging results in their entirety on your MyChart account.  Please discuss all results of fully with your primary care provider and other specialist at your follow-up visit.  Note: Portions of this text may have been transcribed using voice recognition software. Every effort was made to ensure accuracy; however, inadvertent computerized transcription errors may still be present.

## 2022-09-15 NOTE — ED Notes (Signed)
Reviewed discharge instructions and follow up with pt. Pt states understanding. Ambulatory at discharge with husband.

## 2022-09-15 NOTE — ED Provider Notes (Signed)
[place] [facility] Provider Note   CSN: [id number] Arrival date & time: 09/15/22  1240     History  Chief Complaint  Patient presents with   Palpitations    Kristen Meyers is a 52 y.o. female.  With a history of hypertension, hyperlipidemia, type 2 diabetes who presents to the ED for evaluation of palpitations and lightheadedness.  Her symptoms began 2 days ago and have progressively gotten worse.  She states she started a diet approximately 1 month ago and has been exercising significantly more than she typically does.  The day of symptom onset she began to take a natural supplement to boost metabolism and energy.  She also sat outside in the sun watching her daughter play softball for 10 hours.  She reports a history of similar presentation a few years ago which was attributed to caffeine use.  She decreased caffeine use at that time and states her symptoms have mostly improved.  She still drinks approximately 24 ounces of coffee per day.  She states her Fitbit alerted her of a bradycardic episode which caused some concern.  She describes the palpitations as her heart beating stronger than it typically does.  She denies any tachycardia or irregular heart rate.  No chest pain or pressure.  No shortness of breath, fevers, chills.  Her lightheadedness is intermittent and typically worsened with activity.   Palpitations      Home Medications Prior to Admission medications   Medication Sig Start Date End Date Taking? Authorizing Provider  CINNAMON PO Take by mouth. With Milkweed, called Cinnergy    [provider]  Continuous Blood Gluc Sensor (DEXCOM G7 SENSOR) MISC by Does not apply route. 12/03/21   [provider]  dapagliflozin propanediol (FARXIGA) 10 MG TABS tablet TAKE 1 TABLET DAILY 05/19/22   McGowen, Maryjean Morn, MD  enalapril (VASOTEC) 20 MG tablet Take 2 tablets (40 mg total) by mouth daily. 05/14/22   McGowen, Maryjean Morn, MD   glipiZIDE (GLUCOTROL XL) 10 MG 24 hr tablet TAKE 1 TABLET DAILY WITH BREAKFAST (OFFICE VISIT NEEDED FOR FURTHER REFILLS) 09/23/21   McGowen, Maryjean Morn, MD  insulin glargine-yfgn (SEMGLEE, YFGN,) 100 UNIT/ML Pen 10 U SQ qd Patient taking differently: Inject 28 Units into the skin. 10 U SQ qd 09/04/21   McGowen, Maryjean Morn, MD  Insulin Pen Needle (PEN NEEDLES) 32G X 4 MM MISC 1 each by Does not apply route 2 (two) times daily. 09/04/21   McGowen, Maryjean Morn, MD  metFORMIN (GLUCOPHAGE) 1000 MG tablet Take 1 tablet (1,000 mg total) by mouth 2 (two) times daily with a meal. 02/18/21   McGowen, Maryjean Morn, MD  metoprolol succinate (TOPROL-XL) 100 MG 24 hr tablet Take 2 tablets (200 mg total) by mouth daily. Take with or immediately following a meal. 05/14/22   McGowen, Maryjean Morn, MD  rosuvastatin (CRESTOR) 10 MG tablet Take 1 tablet (10 mg total) by mouth daily. 05/15/22   McGowen, Maryjean Morn, MD      Allergies    Codeine    Review of Systems   Review of Systems  Cardiovascular:  Positive for palpitations.  Neurological:  Positive for light-headedness.  All other systems reviewed and are negative.   Physical Exam Updated Vital Signs BP (!) 173/78   Pulse 62   Temp 98.7 F (37.1 C) (Oral)   Resp 18   Ht 5\' 5"  (1.651 m)   Wt 94.3 kg   LMP 11/29/2013   SpO2  100%   BMI 34.61 kg/m  Physical Exam Vitals and nursing note reviewed.  Constitutional:      General: She is not in acute distress.    Appearance: She is well-developed.     Comments: Resting comfortably in bed  HENT:     Head: Normocephalic and atraumatic.  Eyes:     Conjunctiva/sclera: Conjunctivae normal.  Cardiovascular:     Rate and Rhythm: Normal rate and regular rhythm.     Heart sounds: No murmur heard. Pulmonary:     Effort: Pulmonary effort is normal. No respiratory distress.     Breath sounds: Normal breath sounds. No wheezing, rhonchi or rales.  Abdominal:     Palpations: Abdomen is soft.     Tenderness: There is no  abdominal tenderness.  Musculoskeletal:        General: No swelling.     Cervical back: Neck supple.  Skin:    General: Skin is warm and dry.     Capillary Refill: Capillary refill takes less than 2 seconds.  Neurological:     General: No focal deficit present.     Mental Status: She is alert and oriented to person, place, and time.  Psychiatric:        Mood and Affect: Mood normal.        Behavior: Behavior normal.     ED Results / Procedures / Treatments   Labs (all labs ordered are listed, but only abnormal results are displayed) Labs Reviewed  BASIC METABOLIC PANEL - Abnormal; Notable for the following components:      Result Value   CO2 21 (*)    Glucose, Bld 133 (*)    All other components within normal limits  URINALYSIS, ROUTINE W REFLEX MICROSCOPIC - Abnormal; Notable for the following components:   Glucose, UA >=500 (*)    Ketones, ur 15 (*)    All other components within normal limits  URINALYSIS, MICROSCOPIC (REFLEX) - Abnormal; Notable for the following components:   Bacteria, UA RARE (*)    All other components within normal limits  CBC  MAGNESIUM  TROPONIN I (HIGH SENSITIVITY)    EKG None  Radiology DG Chest 2 View  Result Date: 09/15/2022 CLINICAL DATA:  Weakness and palpitations EXAM: CHEST - 2 VIEW COMPARISON:  X-ray 06/22/2014 FINDINGS: No consolidation, pneumothorax or effusion. No edema. Normal cardiopericardial silhouette. Slight degenerative changes of the spine on the lateral view. IMPRESSION: No acute cardiopulmonary disease. Electronically Signed   By: Karen Kays M.D.   On: 09/15/2022 13:23    Procedures Procedures    Medications Ordered in ED Medications - No data to display  ED Course/ Medical Decision Making/ A&P                             Medical Decision Making Amount and/or Complexity of Data Reviewed Labs: ordered. Radiology: ordered.  This patient presents to the ED for concern of palpitations, this involves an extensive  number of treatment options, and is a complaint that carries with it a high risk of complications and morbidity.  The differential diagnosis for palpitations includes cardiac arrhythmias, PVC/PAC, ACS, Cardiomyopathy, CHF, MVP, pericarditis, valvular disease, Panic/Anxiety, Somatic disorder, ETOH, Caffeine,  Stimulant use, medication side effect, Anemia, Hyperthyroidism, pulmonary embolism.    Co morbidities that complicate the patient evaluation  hypertension, hyperlipidemia, type 2 diabetes  My initial workup includes labs, imaging, EKG  Additional history obtained from: Nursing notes from this  visit. Family husband is at bedside and provides a portion of the history  I ordered, reviewed and interpreted labs which include: CBC, BMP, troponin, magnesium.  Hyperglycemia of 133.  I ordered imaging studies including chest x-ray I independently visualized and interpreted imaging which showed normal I agree with the radiologist interpretation  Cardiac Monitoring:  The patient was maintained on a cardiac monitor.  I personally viewed and interpreted the cardiac monitored which showed an underlying rhythm of: NSR  Afebrile, hypertensive but otherwise hemodynamically stable.  52 year old female presenting to the ED for evaluation of palpitations and intermittent lightheadedness.  Symptoms began the day of prolonged sun exposure and the introduction of a new energy supplement.  She appears very well on physical exam.  She is in no acute distress.  EKG is without ischemic changes or arrhythmias.  Troponin negative.  Electrolytes within normal limits.  Chest x-ray normal.  Overall I suspect her palpitations are multifactorial with prolonged sun exposure, a new supplement, caffeine use, recent increase in activity, and a new diet all contributing.  She has no chest pain or pressure I have low suspicion for acute emergent cardiopulmonary abnormalities as the cause of her symptoms. Ambulatory referral to  cardiology was placed. She was encouraged to discontinue the supplement, decrease caffeine use, decrease amount of activity without discontinuing, and follow up with her PCP. She was encouraged to return to the ED with new or worsening symptoms. Stable at discharge.   At this time there does not appear to be any evidence of an acute emergency medical condition and the patient appears stable for discharge with appropriate outpatient follow up. Diagnosis was discussed with patient who verbalizes understanding of care plan and is agreeable to discharge. I have discussed return precautions with patient and husband who verbalizes understanding. Patient encouraged to follow-up with their PCP within 5 days. All questions answered.  Note: Portions of this report may have been transcribed using voice recognition software. Every effort was made to ensure accuracy; however, inadvertent computerized transcription errors may still be present.        Final Clinical Impression(s) / ED Diagnoses Final diagnoses:  Palpitations  Uncontrolled hypertension    Rx / DC Orders ED Discharge Orders          Ordered    Ambulatory referral to Cardiology       Comments: If you have not heard from the Cardiology office within the next 72 hours please call [phone].   09/15/22 1459              Michelle Piper, PA-C 09/15/22 1512    Arby Barrette, MD 09/17/22 [phone]

## 2022-09-16 DIAGNOSIS — M25562 Pain in left knee: Secondary | ICD-10-CM | POA: Diagnosis not present

## 2022-09-16 DIAGNOSIS — M25521 Pain in right elbow: Secondary | ICD-10-CM | POA: Diagnosis not present

## 2022-09-19 ENCOUNTER — Telehealth: Payer: Self-pay

## 2022-09-19 NOTE — Telephone Encounter (Signed)
Patient was seen in ED on 7/1 for heart palpations. She has a scheduled appt to see Dr. Milinda Cave in August.  Should she come in to see him before the appt in August.  She has a new pt appt scheduled with cardiology on 8/9  Please advise pt by calling [phone]

## 2022-09-22 NOTE — Telephone Encounter (Signed)
PATIENT SCHEDULED FOR FIRST AVAILABLE APPT WITH DR. Milinda Cave ON 7/22

## 2022-09-29 ENCOUNTER — Encounter: Payer: Self-pay | Admitting: Family Medicine

## 2022-09-29 MED ORDER — METOPROLOL SUCCINATE ER 50 MG PO TB24
50.0000 mg | ORAL_TABLET | Freq: Every day | ORAL | 1 refills | Status: DC
Start: 2022-09-29 — End: 2022-10-24

## 2022-09-29 NOTE — Telephone Encounter (Signed)
I reviewed your chart and see that back in February 2022 we stopped your hydrochlorothiazide due to low blood pressures.  We could restart a low dose of this but I think it would be a better idea to increase your metoprolol to 50mg  daily.  This could help reduce your palpitations as well as blood pressure. I'll send in rx for the 50mg  tabs. --PM

## 2022-09-30 NOTE — Telephone Encounter (Signed)
Noted, MyChart message read.

## 2022-10-01 DIAGNOSIS — M7711 Lateral epicondylitis, right elbow: Secondary | ICD-10-CM | POA: Diagnosis not present

## 2022-10-05 NOTE — Progress Notes (Signed)
OFFICE VISIT  10/06/2022  CC:  Chief Complaint  Patient presents with   Follow-up    ED VISIT. Palpitations; pt states heart rate dropped and had a hard time getting it back up. Has app with cardiologist in August.    Patient is a 52 y.o. female who presents for f/u palpitations and hypertension. ED visit 09/15/22 reviewed.  INTERIM HX: Patient went to the emergency department 3 weeks ago.  States she felt her heart beating forcefully and palpitating.  ED evaluation was reassuring.    Patient called the office on 09/29/2022 asking if she should go on more blood pressure medication.  I reviewed her ED chart and noted the palpitations and that her heart rate in the emergency department was 76 so  I started her on toprol xl 50mg  daily on 09/29/22.    She felt like the reason she was having palpitations as she was taking an additional supplement--she cannot recall what was in it.  Also, she was on Celebrex for her elbow and knee pain.  She discontinued that as well. She has not felt the palpitations or any abnormalities of her chest since the ED visit. Has not checked blood pressure since getting on the Toprol.  Review of systems: No dizziness, no chest pain, no fever, no lower extremity swelling, no shortness of breath, no headaches, no visual abnormalities.   Past Medical History:  Diagnosis Date   Acute DVT (deep venous thrombosis) (HCC)    DVT 2012-january, L leg, in the context of post-C section. Again 2021, L leg, dx'd shortly after having covid infection->took eliquis x 29mo.   Cholelithiasis without obstruction 09/19/09 (u/s)   COVID-19 virus infection    approx 11/09/19---got antibody infusion   Diabetes mellitus without complication (HCC) 03/21/2010   Fatty liver 09/19/09 (u/s)   Mildly elevated transaminases   GERD 03/21/2010   History of adenomatous polyp of colon 12/2018   2020.  Normal 01/14/22. Recall 5 yrs   History of herpes zoster 03/2012   HYPERLIPIDEMIA 03/21/2010    Intol of simva?   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  01/14/22 no polyps. Recall 5 yrs.   LAPAROSCOPIC HYSTERECTOMY  02/2014   Ovaries remain (tubes out): done for DUB   TUBAL LIGATION  2011   Dr. Malachy Mood is her GYN.   WISDOM TOOTH EXTRACTION      Outpatient Medications Prior to Visit  Medication Sig Dispense Refill   CINNAMON PO Take by mouth. With Milkweed, called Cinnergy     Continuous Blood Gluc Sensor (DEXCOM G7 SENSOR) MISC by Does not apply route.     dapagliflozin propanediol (FARXIGA) 10 MG TABS tablet TAKE 1 TABLET DAILY 90 tablet 0   enalapril (VASOTEC) 20 MG tablet Take 2 tablets (40 mg total) by mouth daily. 180 tablet 1   Insulin Pen Needle (PEN NEEDLES) 32G X 4 MM MISC 1 each by Does not apply route 2 (two) times daily. 200 each 5   metFORMIN (GLUCOPHAGE) 1000 MG tablet Take 1 tablet (1,000 mg total) by mouth 2 (two) times daily with a meal. 180 tablet 3  metoprolol succinate (TOPROL-XL) 50 MG 24 hr tablet Take 1 tablet (50 mg total) by mouth daily. Take with or immediately following a meal. (Patient taking differently: Take 50 mg by mouth daily. Take 3 tabs with or immediately following a meal.) 30 tablet 1   glipiZIDE (GLUCOTROL XL) 10 MG 24 hr tablet TAKE 1 TABLET DAILY WITH BREAKFAST (OFFICE VISIT NEEDED FOR FURTHER REFILLS) 90 tablet 1   insulin glargine-yfgn (SEMGLEE, YFGN,) 100 UNIT/ML Pen 10 U SQ qd (Patient taking differently: Inject 28 Units into the skin. 10 U SQ qd) 15 mL 1   rosuvastatin (CRESTOR) 10 MG tablet Take 1 tablet (10 mg total) by mouth daily. 30 tablet 2   No facility-administered medications prior to visit.     Allergies  Allergen Reactions   Codeine Hives    Review of Systems As per HPI  PE:    10/06/2022    8:59 AM 09/15/2022    2:51 PM 09/15/2022    2:23 PM  Vitals with BMI  Weight 208 lbs 10 oz    BMI 34.71    Systolic 134 171 829  Diastolic 85 82 78  Pulse 64 64 62     Physical Exam  Gen: Alert, well appearing.  Patient is oriented to person, place, time, and situation. AFFECT: pleasant, lucid thought and speech. No further exam today.  LABS:  Last CBC Lab Results  Component Value Date   WBC 6.0 09/15/2022   HGB 14.3 09/15/2022   HCT 43.9 09/15/2022   MCV 86.2 09/15/2022   MCH 28.1 09/15/2022   RDW 13.3 09/15/2022   PLT 260 09/15/2022   Last metabolic panel Lab Results  Component Value Date   GLUCOSE 133 (H) 09/15/2022   NA 135 09/15/2022   K 3.9 09/15/2022   CL 102 09/15/2022   CO2 21 (L) 09/15/2022   BUN 15 09/15/2022   CREATININE 0.75 09/15/2022   GFRNONAA >60 09/15/2022   CALCIUM 9.3 09/15/2022   PROT 6.6 05/14/2022   ALBUMIN 4.1 05/14/2022   BILITOT 0.4 05/14/2022   ALKPHOS 84 05/14/2022   AST 15 05/14/2022   ALT 25 05/14/2022   ANIONGAP 12 09/15/2022   Last hemoglobin A1c Lab Results  Component Value Date   HGBA1C 7.7 06/04/2022   IMPRESSION AND PLAN:  #1 palpitations.  Question secondary to patient starting a new supplement and possibly being on Celebrex, which was new for her. They have resolved. She has appt with cardiologist Dr. Belva Crome on 10/24/22.  #2 hypertension, blood pressure today is the first 1 has been checked since she got on her Toprol and is 134/85. We will continue her on the Toprol 50 mg a day and the enalapril 40 mg a day.  An After Visit Summary was printed and given to the patient.  FOLLOW UP: Return for Keep appointment already set for 8/28.  Signed:  Santiago Bumpers, MD           10/06/2022

## 2022-10-06 ENCOUNTER — Ambulatory Visit: Payer: BC Managed Care – PPO | Admitting: Family Medicine

## 2022-10-06 ENCOUNTER — Encounter: Payer: Self-pay | Admitting: Family Medicine

## 2022-10-06 VITALS — BP 134/85 | HR 64 | Wt 208.6 lb

## 2022-10-06 DIAGNOSIS — I10 Essential (primary) hypertension: Secondary | ICD-10-CM | POA: Diagnosis not present

## 2022-10-06 DIAGNOSIS — R002 Palpitations: Secondary | ICD-10-CM

## 2022-10-13 DIAGNOSIS — H16223 Keratoconjunctivitis sicca, not specified as Sjogren's, bilateral: Secondary | ICD-10-CM | POA: Diagnosis not present

## 2022-10-13 DIAGNOSIS — E113292 Type 2 diabetes mellitus with mild nonproliferative diabetic retinopathy without macular edema, left eye: Secondary | ICD-10-CM | POA: Diagnosis not present

## 2022-10-13 LAB — HM DIABETES EYE EXAM

## 2022-10-15 ENCOUNTER — Encounter (INDEPENDENT_AMBULATORY_CARE_PROVIDER_SITE_OTHER): Payer: Self-pay

## 2022-10-15 DIAGNOSIS — M7711 Lateral epicondylitis, right elbow: Secondary | ICD-10-CM | POA: Diagnosis not present

## 2022-10-16 DIAGNOSIS — M7711 Lateral epicondylitis, right elbow: Secondary | ICD-10-CM | POA: Diagnosis not present

## 2022-10-16 HISTORY — PX: OTHER SURGICAL HISTORY: SHX169

## 2022-10-21 ENCOUNTER — Other Ambulatory Visit: Payer: Self-pay

## 2022-10-21 ENCOUNTER — Other Ambulatory Visit: Payer: Self-pay | Admitting: Family Medicine

## 2022-10-21 DIAGNOSIS — I82409 Acute embolism and thrombosis of unspecified deep veins of unspecified lower extremity: Secondary | ICD-10-CM | POA: Insufficient documentation

## 2022-10-21 DIAGNOSIS — K8020 Calculus of gallbladder without cholecystitis without obstruction: Secondary | ICD-10-CM | POA: Insufficient documentation

## 2022-10-21 DIAGNOSIS — M7711 Lateral epicondylitis, right elbow: Secondary | ICD-10-CM | POA: Diagnosis not present

## 2022-10-21 DIAGNOSIS — R569 Unspecified convulsions: Secondary | ICD-10-CM | POA: Insufficient documentation

## 2022-10-21 DIAGNOSIS — Z1231 Encounter for screening mammogram for malignant neoplasm of breast: Secondary | ICD-10-CM

## 2022-10-21 DIAGNOSIS — K760 Fatty (change of) liver, not elsewhere classified: Secondary | ICD-10-CM | POA: Insufficient documentation

## 2022-10-21 DIAGNOSIS — E669 Obesity, unspecified: Secondary | ICD-10-CM | POA: Insufficient documentation

## 2022-10-21 DIAGNOSIS — U071 COVID-19: Secondary | ICD-10-CM | POA: Insufficient documentation

## 2022-10-22 ENCOUNTER — Other Ambulatory Visit: Payer: Self-pay | Admitting: Family Medicine

## 2022-10-22 DIAGNOSIS — Z6837 Body mass index (BMI) 37.0-37.9, adult: Secondary | ICD-10-CM | POA: Diagnosis not present

## 2022-10-22 DIAGNOSIS — Z01419 Encounter for gynecological examination (general) (routine) without abnormal findings: Secondary | ICD-10-CM | POA: Diagnosis not present

## 2022-10-22 DIAGNOSIS — Z1272 Encounter for screening for malignant neoplasm of vagina: Secondary | ICD-10-CM | POA: Diagnosis not present

## 2022-10-23 DIAGNOSIS — M7711 Lateral epicondylitis, right elbow: Secondary | ICD-10-CM | POA: Diagnosis not present

## 2022-10-24 ENCOUNTER — Encounter: Payer: Self-pay | Admitting: Cardiology

## 2022-10-24 ENCOUNTER — Ambulatory Visit: Payer: BC Managed Care – PPO | Attending: Cardiology | Admitting: Cardiology

## 2022-10-24 VITALS — BP 138/86 | HR 72 | Ht 63.0 in | Wt 207.1 lb

## 2022-10-24 DIAGNOSIS — R002 Palpitations: Secondary | ICD-10-CM

## 2022-10-24 DIAGNOSIS — E119 Type 2 diabetes mellitus without complications: Secondary | ICD-10-CM

## 2022-10-24 DIAGNOSIS — R079 Chest pain, unspecified: Secondary | ICD-10-CM | POA: Insufficient documentation

## 2022-10-24 DIAGNOSIS — I10 Essential (primary) hypertension: Secondary | ICD-10-CM

## 2022-10-24 DIAGNOSIS — R011 Cardiac murmur, unspecified: Secondary | ICD-10-CM

## 2022-10-24 DIAGNOSIS — E785 Hyperlipidemia, unspecified: Secondary | ICD-10-CM

## 2022-10-24 HISTORY — DX: Cardiac murmur, unspecified: R01.1

## 2022-10-24 HISTORY — DX: Chest pain, unspecified: R07.9

## 2022-10-24 MED ORDER — ROSUVASTATIN CALCIUM 10 MG PO TABS
10.0000 mg | ORAL_TABLET | Freq: Every day | ORAL | 3 refills | Status: AC
Start: 2022-10-24 — End: 2023-10-19

## 2022-10-24 NOTE — Patient Instructions (Addendum)
Medication Instructions:  Your physician has recommended you make the following change in your medication:   Start Rosuvastatin 10 mg daily.  *If you need a refill on your cardiac medications before your next appointment, please call your pharmacy*   Lab Work: Your physician recommends that you have a CMP today in the office.  Your physician recommends that you return for lab work in: 6 weeks for CMP and lipids. You need to have labs done when you are fasting. [facility] is located on the 3rd floor, [address]. Hours are Monday - Friday 8 am to 4 pm, closed 11:30 am to 1:00 pm. You do NOT need an appointment.   If you have labs (blood work) drawn today and your tests are completely normal, you will receive your results only by: MyChart Message (if you have MyChart) OR A paper copy in the mail If you have any lab test that is abnormal or we need to change your treatment, we will call you to review the results.   Testing/Procedures:  Stress Echocardiogram Instructions:    1. You may take all of your medications except Toprol XL (Metoprolol hold beta blockers).  2. No food, drink or tobacco products four hours prior to your test.  3. Dress prepared to exercise. Best to wear 2 piece outfit and tennis shoes. Shoes must be closed toe.  4. Please bring all current prescription medications.  Your physician has requested that you have an echocardiogram. Echocardiography is a painless test that uses sound waves to create images of your heart. It provides your doctor with information about the size and shape of your heart and how well your heart's chambers and valves are working. This procedure takes approximately one hour. There are no restrictions for this procedure. Please do NOT wear cologne, perfume, aftershave, or lotions (deodorant is allowed). Please arrive 15 minutes prior to your appointment time.  We will order CT coronary calcium score. It will cost $99.00 and iis due at time  of scan.  Please call to schedule.    [facility] [address] [place], [place] [address] [phone]  Follow-Up: At [facility], you and your health needs are our priority.  As part of our continuing mission to provide you with exceptional heart care, we have created designated Provider Care Teams.  These Care Teams include your primary Cardiologist (physician) and Advanced Practice Providers (APPs -  Physician Assistants and Nurse Practitioners) who all work together to provide you with the care you need, when you need it.  We recommend signing up for the patient portal called "MyChart".  Sign up information is provided on this After Visit Summary.  MyChart is used to connect with patients for Virtual Visits (Telemedicine).  Patients are able to view lab/test results, encounter notes, upcoming appointments, etc.  Non-urgent messages can be sent to your provider as well.   To learn more about what you can do with MyChart, go to [email or web address].    Your next appointment:   9 month(s)  The format for your next appointment:   In Person  Provider:   Belva Crome, MD   Other Instructions Exercise Stress Echocardiogram An exercise stress echocardiogram is a test to check how well your heart is working. This test uses sound waves and a computer to make pictures of your heart. These pictures will be taken before and after you exercise. For this test, you will walk on a treadmill or ride a bicycle to make your  heart beat faster. While you exercise, your heart will be checked with an electrocardiogram (ECG). Your blood pressure will also be checked. You may have this test if: You have chest pain or a heart problem. You had a heart attack or heart surgery not long ago. You have heart valve problems. You have a condition that causes narrowing of the blood vessels that supply your heart. You have a high risk of heart disease and: You are starting a new exercise  program. You need to have a big surgery. Tell a doctor about: Any allergies you have. All medicines you are taking. This includes vitamins, herbs, eye drops, creams, and over-the-counter medicines. Any problems you or family members have had with medicines that make you fall asleep (anesthetic medicines). Any surgeries you have had. Any blood disorders you have. Any medical conditions you have. Whether you are pregnant or may be pregnant. What are the risks? Generally, this is a safe test. However, problems may occur, including: Chest pain. Feeling dizzy or light-headed. Shortness of breath. Increased or irregular heartbeat. Feeling like you may vomit (nausea) or vomiting. Heart attack. This is very rare. What happens before the test? Medicines Ask your doctor about changing or stopping your normal medicines. This is important if you take diabetes medicines or blood thinners. If you use an inhaler, bring it to the test. General instructions Wear comfortable clothes and walking shoes. Follow instructions from your doctor about what you cannot eat or drink before the test. Do not drink or eat anything that has caffeine in it. Stop having caffeine 24 hours before the test. Do not smoke or use products that contain nicotine or tobacco for 4 hours before the test. If you need help quitting, ask your doctor. What happens during the test?  You will take off your clothes from the waist up and put on a hospital gown. Electrodes or patches will be put on your chest. A blood pressure cuff will be put on your arm. Before you exercise, a computer will make a picture of your heart. To do this: You will lie down and a gel will be put on your chest. A wand will be moved over the gel. Sound waves from the wand will go to the computer to make the picture. Then, you will start to exercise. You may walk on a treadmill or pedal a bicycle. Your blood pressure and heart rhythm will be checked while you  exercise. The exercise will get harder or faster. You will exercise until: Your heart reaches a certain level. You are too tired to go on. You cannot go on because of chest pain, weakness, or dizziness. You will lie down right away so another picture of your heart can be taken. The procedure may vary among doctors and hospitals. What can I expect after the test? After your test, it is common to have: Mild soreness. Mild tiredness. Your heart rate and blood pressure will be checked until they return to your normal levels. You should not have any new symptoms after this test. Follow these instructions at home: If your doctor says that you can, you may: Eat what you normally eat. Do your normal activities. Take over-the-counter and prescription medicines only as told by your doctor. Keep all follow-up visits. It is up to you to get the results of your test. Ask how to get your results when they are ready. Contact a doctor if: You feel dizzy or light-headed. You have a fast or irregular heartbeat. You  feel like you may vomit or you vomit. You have a headache. You feel short of breath. Get help right away if: You develop pain or pressure: In your chest. In your jaw or neck. Between your shoulders. That goes down your left arm. You faint. You have trouble breathing. These symptoms may be an emergency. Get medical help right away. Call your local emergency services (911 in the [place] Do not wait to see if the symptoms will go away. Do not drive yourself to the hospital. Summary This is a test that checks how well your heart is working. Follow instructions about what you cannot eat or drink before the test. Ask your doctor if you should take your normal medicines before the test. Stop having caffeine 24 hours before the test. Do not smoke or use products with nicotine or tobacco in them for 4 hours before the test. During the test, your blood pressure and heart rhythm will be  checked while you exercise. This information is not intended to replace advice given to you by your health care provider. Make sure you discuss any questions you have with your health care provider. Document Revised: 11/14/2020 Document Reviewed: 10/25/2019 Elsevier Patient Education  2022 [employer]  Echocardiogram An echocardiogram is a test that uses sound waves to make images of your heart. This way of making images is often called ultrasound. The images from this test can help find out many things about your heart, including: The size and shape of your heart. The strength of your heart muscle and how well it's working. The size, thickness, and movement of your heart's walls. How your heart valves are working. Problems such as: A tumor or a growth from an infection around the heart valves. Areas of heart muscle that aren't working well because of poor blood flow or injury from a heart attack. An aneurysm. This is a weak or damaged part of an artery wall. An artery is a blood vessel. Tell a health care provider about: Any allergies you have. All medicines you're taking, including vitamins, herbs, eye drops, creams, and over-the-counter medicines. Any bleeding problems you have. Any surgeries you've had. Any medical problems you have. Whether you're pregnant or may be pregnant. What are the risks? Your health care provider will talk with you about risks. These may include an allergic reaction to IV dye that may be used during the test. What happens before the test? You don't need to do anything to get ready for this test. You may eat and drink normally. What happens during the test?  You'll take off your clothes from the waist up and put on a hospital gown. Sticky patches called electrodes may be placed on your chest. These will be connected to a machine that monitors your heart rate and rhythm. You'll lie down on a table for the exam. A wand covered in gel will be moved over  your chest. Sound waves from the wand will go to your heart and bounce back--or "echo" back. The sound waves will go to a computer that uses them to make images of your heart. The images can be viewed on a monitor. The images will also be recorded on the computer so your provider can look at them later. You may be asked to change positions or hold your breath for a short time. This makes it easier to get different views or better views of your heart. In some cases, you may be given a dye through an IV. The IV  is put into one of your veins. This dye can make the areas of your heart easier to see. The procedure may vary among providers and hospitals. What can I expect after the test? You may return to your normal diet, activities, and medicines unless your provider tells you not to. If an IV was placed for the test, it will be removed. It's up to you to get the results of your test. Ask your provider, or the department that's doing the test, when your results will be ready. This information is not intended to replace advice given to you by your health care provider. Make sure you discuss any questions you have with your health care provider. Document Revised: 05/02/2022 Document Reviewed: 05/02/2022 Elsevier Patient Education  2024 [employer].  Coronary Calcium Scan A coronary calcium scan is an imaging test used to look for deposits of plaque in the inner lining of the blood vessels of the heart (coronary arteries). Plaque is made up of calcium, protein, and fatty substances. These deposits of plaque can partly clog and narrow the coronary arteries without producing any symptoms or warning signs. This puts a person at risk for a heart attack. A coronary calcium scan is performed using a computed tomography (CT) scanner machine without using a dye (contrast). This test is recommended for people who are at moderate risk for heart disease. The test can find plaque deposits before symptoms  develop. Tell a health care provider about: Any allergies you have. All medicines you are taking, including vitamins, herbs, eye drops, creams, and over-the-counter medicines. Any problems you or family members have had with anesthetic medicines. Any bleeding problems you have. Any surgeries you have had. Any medical conditions you have. Whether you are pregnant or may be pregnant. What are the risks? Generally, this is a safe procedure. However, problems may occur, including: Harm to a pregnant woman and her unborn baby. This test involves the use of radiation. Radiation exposure can be dangerous to a pregnant woman and her unborn baby. If you are pregnant or think you may be pregnant, you should not have this procedure done. A slight increase in the risk of cancer. This is because of the radiation involved in the test. The amount of radiation from one test is similar to the amount of radiation you are naturally exposed to over one year. What happens before the procedure? Ask your health care provider for any specific instructions on how to prepare for this procedure. You may be asked to avoid products that contain caffeine, tobacco, or nicotine for 4 hours before the procedure. What happens during the procedure?  You will undress and remove any jewelry from your neck or chest. You may need to remove hearing aides and dentures. Women may need to remove their bras. You will put on a hospital gown. Sticky electrodes will be placed on your chest. The electrodes will be connected to an electrocardiogram (ECG) machine to record a tracing of the electrical activity of your heart. You will lie down on your back on a curved bed that is attached to the CT scanner. You may be given medicine to slow down your heart rate so that clear pictures can be created. You will be moved into the CT scanner, and the CT scanner will take pictures of your heart. During this time, you will be asked to lie still and  hold your breath for 10-20 seconds at a time while each picture of your heart is being taken. The procedure  may vary among health care providers and hospitals. What can I expect after the procedure? You can return to your normal activities. It is up to you to get the results of your procedure. Ask your health care provider, or the department that is doing the procedure, when your results will be ready. Summary A coronary calcium scan is an imaging test used to look for deposits of plaque in the inner lining of the blood vessels of the heart. Plaque is made up of calcium, protein, and fatty substances. A coronary calcium scan is performed using a CT scanner machine without contrast. Generally, this is a safe procedure. Tell your health care provider if you are pregnant or may be pregnant. Ask your health care provider for any specific instructions on how to prepare for this procedure. You can return to your normal activities after the scan is done. This information is not intended to replace advice given to you by your health care provider. Make sure you discuss any questions you have with your health care provider. Document Revised: 02/10/2021 Document Reviewed: 02/10/2021 Elsevier Patient Education  2024 [employer].

## 2022-10-24 NOTE — Progress Notes (Signed)
Cardiology Office Note:    Date:  10/24/2022   ID:  Kristen Meyers, DOB 07-Dec-1970, MRN [id number]  PCP:  Jeoffrey Massed, MD  Cardiologist:  Garwin Brothers, MD   Referring MD: Michelle Piper, [place]*    ASSESSMENT:    1. Essential hypertension   2. Diabetes mellitus without complication (HCC)   3. Palpitations   4. Chest pain of uncertain etiology   5. Cardiac murmur    PLAN:    In order of problems listed above:  Primary prevention stressed with the patient.  Importance of compliance with diet medication stressed and patient verbalized standing. Chest pain: Atypical in nature for coronary etiology however in view of risk factors such as diabetes mellitus will do an exercise stress echo and she is agreeable. Cardiac murmur: Echocardiogram will be done to assess murmur heard on auscultation. Essential hypertension: Blood pressure is stable and diet was emphasized.  Lifestyle modification urged.  Diet emphasized.  Salt intake issues were discussed. Diabetes mellitus and obesity: Weight reduction stressed diet emphasized.  Hemoglobin A1c is 7.7 and I cautioned her about this.  Risks explained.  Lifestyle modification urged and she promises to do better. Mixed dyslipidemia: Not on lipid-lowering medications.  She was mentioning to me that she is not clear about the risks.  I discussed benefits at length and she is agreeable.  We will also do calcium scoring CT scan.  I will do liver function tests and initiate her on rosuvastatin 10 mg daily.  She will be back in 6 weeks for liver lipid check. Patient will be seen in follow-up appointment in 6 months or earlier if the patient has any concerns.    Medication Adjustments/Labs and Tests Ordered: Current medicines are reviewed at length with the patient today.  Concerns regarding medicines are outlined above.  Orders Placed This Encounter  Procedures   EKG 12-Lead   No orders of the defined types were placed in this  encounter.    History of Present Illness:    Kristen Meyers is a 52 y.o. female who is being seen today for the evaluation of chest pain at the request of Michelle Piper, [place]*.  Patient is a pleasant 52 year old female.  She has past medical history of essential hypertension, mixed dyslipidemia, diabetes mellitus and obesity.  She mentions to me that she placed some tenderness on and off.  She mentions to me that she occasionally has chest discomfort not related to exertion and is concerned about it.  Recently her blood pressure was not under the best control but now she is better with increasing medications.  At the time of my evaluation, the patient is alert awake oriented and in no distress.  Past Medical History:  Diagnosis Date   Acute DVT (deep venous thrombosis) (HCC)    DVT 2012-january, L leg, in the context of post-C section. Again 2021, L leg, dx'd shortly after having covid infection->took eliquis x 36mo.   Cholelithiasis without obstruction 09/19/09 (u/s)   COVID-19 virus infection    approx 11/09/19---got antibody infusion   Deep venous thrombosis (HCC) 02/14/2020   Diabetes mellitus without complication (HCC) 03/21/2010   Elevated transaminase level 07/19/2012   Essential hypertension 03/21/2010   Qualifier: Diagnosis of   By: Gabriel Rung LPN, Enid Baas liver 09/19/09 (u/s)   Mildly elevated transaminases   GERD 03/21/2010   Health maintenance examination 07/20/2013   History of adenomatous polyp of colon 12/2018  2020.  Normal 01/14/22. Recall 5 yrs   History of herpes zoster 03/2012   History of laparoscopic-assisted vaginal hysterectomy 02/14/2020   Hyperlipidemia 03/21/2010   Qualifier: Diagnosis of   By: Gabriel Rung LPN, Nancy         Left shoulder pain 09/30/2012   Low back pain 09/30/2012   Melanocytic nevi, unspecified 12/30/2018   NAFLD (nonalcoholic fatty liver disease) 84/69/6295   Obesity, Class II, BMI 35-39.9    Obesity, morbid (HCC) 10/19/2012    [facility]     Paresthesia of left arm 09/30/2012   Seizures (HCC)    as child- had 2 none since- no current treatment- never dx'd with any seizure d/o    Subclinical hypothyroidism 2021   2021-22->multinod goiter on u/s 01/2021    Past Surgical History:  Procedure Laterality Date   CESAREAN SECTION     2003, 2005   COLONOSCOPY  01/05/2019   2020 polyps.  01/14/22 no polyps. Recall 5 yrs.   LAPAROSCOPIC HYSTERECTOMY  02/2014   Ovaries remain (tubes out): done for DUB   TUBAL LIGATION  2011   Dr. Malachy Mood is her GYN.   WISDOM TOOTH EXTRACTION      Current Medications: Current Meds  Medication Sig   dapagliflozin propanediol (FARXIGA) 10 MG TABS tablet TAKE 1 TABLET DAILY   enalapril (VASOTEC) 20 MG tablet Take 2 tablets (40 mg total) by mouth daily.   Insulin Pen Needle (PEN NEEDLES) 32G X 4 MM MISC 1 each by Does not apply route 2 (two) times daily.   metFORMIN (GLUCOPHAGE) 1000 MG tablet Take 1 tablet (1,000 mg total) by mouth 2 (two) times daily with a meal.   metoprolol succinate (TOPROL-XL) 50 MG 24 hr tablet Take 150 mg by mouth daily. Take with or immediately following a meal.     Allergies:   Codeine   Social History   Socioeconomic History   Marital status: Married    Spouse name: Not on file   Number of children: Not on file   Years of education: Not on file   Highest education level: Bachelor's degree (e.g., BA, AB, BS)  Occupational History   Not on file  Tobacco Use   Smoking status: Never   Smokeless tobacco: Never  Vaping Use   Vaping status: Never Used  Substance and Sexual Activity   Alcohol use: Never   Drug use: Never   Sexual activity: Yes    Birth control/protection: Surgical  Other Topics Concern   Not on file  Social History Narrative   Married, 1 child.   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   Orig from [place], currently living in [place].   No T/A/Ds.   Exercise: 1- 3 days a week, steps +  cardio.   Social Determinants of Health   Financial Resource Strain: Low Risk  (06/03/2022)   Received from [facility], [facility]   Overall Financial Resource Strain (CARDIA)    Difficulty of Paying Living Expenses: Not hard at all  Food Insecurity: No Food Insecurity (06/03/2022)   Received from [facility], [facility]   Hunger Vital Sign    Worried About Running Out of Food in the Last Year: Never true    Ran Out of Food in the Last Year: Never true  Transportation Needs: No Transportation Needs (06/03/2022)   Received from [facility]   PRAPARE - Transportation    Lack of Transportation (Medical): No    Lack of Transportation (Non-Medical):  No  Physical Activity: Insufficiently Active (06/03/2022)   Received from [facility], [facility]   Exercise Vital Sign    Days of Exercise per Week: 3 days    Minutes of Exercise per Session: 40 min  Stress: No Stress Concern Present (06/03/2022)   Received from [facility], [facility] - Occupational Stress Questionnaire    Feeling of Stress : Not at all  Social Connections: Moderately Integrated (06/03/2022)   Received from [facility], [facility]   Social Network    How would you rate your social network (family, work, friends)?: Adequate participation with social networks     Family History: The patient's family history includes Breast cancer in her mother; Cancer in her mother; Colon cancer in her maternal uncle; Diabetes in her mother; Heart disease in her mother; Hyperlipidemia in her mother; Hypertension in her mother; Lung cancer in her mother; Ovarian cancer in her mother. There is no history of Rectal cancer, Stomach cancer, Colon polyps, or Esophageal cancer.  ROS:   Please see the history of present illness.    All other systems reviewed and are negative.  EKGs/Labs/Other Studies Reviewed:    The following studies were reviewed today: .Marland KitchenEKG  Interpretation Date/Time:  Friday October 24 2022 09:22:08 EDT Ventricular Rate:  72 PR Interval:  144 QRS Duration:  86 QT Interval:  420 QTC Calculation: 459 R Axis:   10  Text Interpretation: Normal sinus rhythm Cannot rule out Anterior infarct , age undetermined When compared with ECG of 15-Sep-2022 12:50, PREVIOUS ECG IS PRESENT Confirmed by Belva Crome [phone]) on 10/24/2022 9:35:01 AM   EKG Interpretation Date/Time:  Friday October 24 2022 09:22:08 EDT Ventricular Rate:  72 PR Interval:  144 QRS Duration:  86 QT Interval:  420 QTC Calculation: 459 R Axis:   10  Text Interpretation: Normal sinus rhythm Cannot rule out Anterior infarct , age undetermined When compared with ECG of 15-Sep-2022 12:50, PREVIOUS ECG IS PRESENT Confirmed by Belva Crome [phone]) on 10/24/2022 9:35:01 AM     Recent Labs: 05/14/2022: ALT 25; TSH 4.85 09/15/2022: BUN 15; Creatinine, Ser 0.75; Hemoglobin 14.3; Magnesium 2.1; Platelets 260; Potassium 3.9; Sodium 135  Recent Lipid Panel    Component Value Date/Time   CHOL 227 (H) 05/14/2022 0835   TRIG 186.0 (H) 05/14/2022 0835   HDL 53.20 05/14/2022 0835   CHOLHDL 4 05/14/2022 0835   VLDL 37.2 05/14/2022 0835   LDLCALC 136 (H) 05/14/2022 0835   LDLDIRECT 140.0 09/24/2020 1058    Physical Exam:    VS:  BP 138/86   Pulse 72   Ht 5\' 3"  (1.6 m)   Wt 207 lb 1.3 oz (93.9 kg)   LMP 11/29/2013   SpO2 98%   BMI 36.68 kg/m     Wt Readings from Last 3 Encounters:  10/24/22 207 lb 1.3 oz (93.9 kg)  10/06/22 208 lb 9.6 oz (94.6 kg)  09/15/22 208 lb (94.3 kg)     GEN: Patient is in no acute distress HEENT: Normal NECK: No JVD; No carotid bruits LYMPHATICS: No lymphadenopathy CARDIAC: S1 S2 regular, 2/6 systolic murmur at the apex. RESPIRATORY:  Clear to auscultation without rales, wheezing or rhonchi  ABDOMEN: Soft, non-tender, non-distended MUSCULOSKELETAL:  No edema; No deformity  SKIN: Warm and dry NEUROLOGIC:  Alert and oriented x  3 PSYCHIATRIC:  Normal affect    Signed, Garwin Brothers, MD  10/24/2022 9:49 AM    [facility]  [facility]

## 2022-10-29 ENCOUNTER — Other Ambulatory Visit: Payer: Self-pay | Admitting: Family Medicine

## 2022-10-30 ENCOUNTER — Telehealth: Payer: Self-pay

## 2022-10-30 ENCOUNTER — Ambulatory Visit (HOSPITAL_BASED_OUTPATIENT_CLINIC_OR_DEPARTMENT_OTHER)
Admission: RE | Admit: 2022-10-30 | Discharge: 2022-10-30 | Disposition: A | Discharge status: Home / Self Care | Payer: BC Managed Care – PPO | Source: Ambulatory Visit | Attending: Cardiology | Admitting: Cardiology

## 2022-10-30 DIAGNOSIS — E785 Hyperlipidemia, unspecified: Secondary | ICD-10-CM | POA: Insufficient documentation

## 2022-10-30 DIAGNOSIS — M7711 Lateral epicondylitis, right elbow: Secondary | ICD-10-CM | POA: Diagnosis not present

## 2022-10-30 NOTE — Telephone Encounter (Signed)
-----   Message from Oriska R Revankar sent at 10/29/2022 11:49 AM EDT ----- Rosuvastatin 10 mg daily and liver lipid check in 6 weeks.  Copy primary care Garwin Brothers, MD 10/29/2022 11:49 AM

## 2022-10-30 NOTE — Telephone Encounter (Signed)
MyChart message

## 2022-10-31 ENCOUNTER — Other Ambulatory Visit: Payer: Self-pay

## 2022-10-31 DIAGNOSIS — M7711 Lateral epicondylitis, right elbow: Secondary | ICD-10-CM | POA: Diagnosis not present

## 2022-10-31 MED ORDER — METOPROLOL SUCCINATE ER 50 MG PO TB24: 150.0000 mg | ORAL_TABLET | Freq: Every day | ORAL | 1 refills | Status: DC

## 2022-10-31 NOTE — Telephone Encounter (Signed)
Pt is requesting 90 d/s for Metoprolol. Last OV 7/22, next OV 8/28.  Please fill for 90 d/s , if appropriate.

## 2022-11-04 DIAGNOSIS — M7711 Lateral epicondylitis, right elbow: Secondary | ICD-10-CM | POA: Diagnosis not present

## 2022-11-06 DIAGNOSIS — M7711 Lateral epicondylitis, right elbow: Secondary | ICD-10-CM | POA: Diagnosis not present

## 2022-11-07 ENCOUNTER — Telehealth: Payer: Self-pay

## 2022-11-07 ENCOUNTER — Encounter: Payer: Self-pay | Admitting: Family Medicine

## 2022-11-07 DIAGNOSIS — R931 Abnormal findings on diagnostic imaging of heart and coronary circulation: Secondary | ICD-10-CM

## 2022-11-07 DIAGNOSIS — E785 Hyperlipidemia, unspecified: Secondary | ICD-10-CM

## 2022-11-07 NOTE — Telephone Encounter (Signed)
My Chart message sent

## 2022-11-07 NOTE — Telephone Encounter (Signed)
-----   Message from Aundra Dubin Revankar sent at 11/07/2022  8:59 AM EDT ----- Elevated calcium score.  Would like to bring her in for a Chem-7 liver lipid check.  Copy primary care.  Patient is already on statin therapy. Garwin Brothers, MD 11/07/2022 8:59 AM

## 2022-11-10 DIAGNOSIS — E785 Hyperlipidemia, unspecified: Secondary | ICD-10-CM | POA: Diagnosis not present

## 2022-11-10 DIAGNOSIS — R931 Abnormal findings on diagnostic imaging of heart and coronary circulation: Secondary | ICD-10-CM | POA: Diagnosis not present

## 2022-11-11 LAB — COMPREHENSIVE METABOLIC PANEL
ALT: 25 IU/L (ref 0–32)
AST: 17 IU/L (ref 0–40)
Albumin: 4.3 g/dL (ref 3.8–4.9)
Alkaline Phosphatase: 73 IU/L (ref 44–121)
BUN/Creatinine Ratio: 21 (ref 9–23)
BUN: 14 mg/dL (ref 6–24)
Bilirubin Total: 0.3 mg/dL (ref 0.0–1.2)
CO2: 23 mmol/L (ref 20–29)
Calcium: 9 mg/dL (ref 8.7–10.2)
Chloride: 105 mmol/L (ref 96–106)
Creatinine, Ser: 0.66 mg/dL (ref 0.57–1.00)
Globulin, Total: 2 g/dL (ref 1.5–4.5)
Glucose: 200 mg/dL — ABNORMAL HIGH (ref 70–99)
Potassium: 4.9 mmol/L (ref 3.5–5.2)
Sodium: 141 mmol/L (ref 134–144)
Total Protein: 6.3 g/dL (ref 6.0–8.5)
eGFR: 105 mL/min/{1.73_m2} (ref >59)

## 2022-11-11 LAB — LIPID PANEL
Chol/HDL Ratio: 3.4 ratio (ref 0.0–4.4)
Cholesterol, Total: 135 mg/dL (ref 100–199)
HDL: 40 mg/dL (ref >39)
LDL Chol Calc (NIH): 78 mg/dL (ref 0–99)
Triglycerides: 90 mg/dL (ref 0–149)
VLDL Cholesterol Cal: 17 mg/dL (ref 5–40)

## 2022-11-11 NOTE — Patient Instructions (Signed)

## 2022-11-12 ENCOUNTER — Ambulatory Visit: Payer: BC Managed Care – PPO | Admitting: Family Medicine

## 2022-11-12 ENCOUNTER — Encounter: Payer: Self-pay | Admitting: Family Medicine

## 2022-11-12 VITALS — BP 134/91 | HR 58 | Wt 208.4 lb

## 2022-11-12 DIAGNOSIS — I10 Essential (primary) hypertension: Secondary | ICD-10-CM | POA: Diagnosis not present

## 2022-11-12 MED ORDER — HYDROCHLOROTHIAZIDE 12.5 MG PO TABS
12.5000 mg | ORAL_TABLET | Freq: Every day | ORAL | 0 refills | Status: DC
Start: 2022-11-12 — End: 2022-12-01

## 2022-11-12 NOTE — Progress Notes (Signed)
OFFICE VISIT  11/12/2022  CC:  Chief Complaint  Patient presents with   Medical Management of Chronic Issues    Patient is a 52 y.o. female who presents for follow-up hypertension.  INTERIM HX: Kristen Meyers feels well.  She saw the cardiologist on 10/24/2022.  Was having chest pain.  Stress echocardiogram is to be done, calcium scoring CT scan showed she was in the 90th percentile.  Cardiologist had just started her on rosuvastatin 10 mg prior to that. She has no problem with the rosuvastatin at this time. no home blood pressure monitoring.   Past Medical History:  Diagnosis Date   Acute DVT (deep venous thrombosis) (HCC)    DVT 2012-january, L leg, in the context of post-C section. Again 2021, L leg, dx'd shortly after having covid infection->took eliquis x 29mo.   Cholelithiasis without obstruction 09/19/09 (u/s)   COVID-19 virus infection    approx 11/09/19---got antibody infusion   Diabetes mellitus without complication (HCC) 03/21/2010   Elevated transaminase level 07/19/2012   Essential hypertension 03/21/2010   Qualifier: Diagnosis of   By: Gabriel Rung LPN, Enid Baas liver 09/19/09 (u/s)   Mildly elevated transaminases   GERD 03/21/2010   History of adenomatous polyp of colon 12/2018   2020.  Normal 01/14/22. Recall 5 yrs   History of herpes zoster 03/2012   Hyperlipidemia 03/21/2010   Qualifier: Diagnosis of   By: Gabriel Rung LPN, Harriett Sine         Melanocytic nevi, unspecified 12/30/2018   NAFLD (nonalcoholic fatty liver disease) 60/45/4098   Obesity, Class II, BMI 35-39.9    Paresthesia of left arm 09/30/2012   Seizures (HCC)    as child- had 2 none since- no current treatment- never dx'd with any seizure d/o    Subclinical hypothyroidism 2021   2021-22->multinod goiter on u/s 01/2021    Past Surgical History:  Procedure Laterality Date   CESAREAN SECTION     2003, 2005   COLONOSCOPY  01/05/2019   2020 polyps.  01/14/22 no polyps. Recall 5 yrs.   Coronary calcium  score  10/2022   Score 26, 90th%'tile   LAPAROSCOPIC HYSTERECTOMY  02/2014   Ovaries remain (tubes out): done for DUB   TUBAL LIGATION  2011   Dr. Malachy Mood is her GYN.   WISDOM TOOTH EXTRACTION      Outpatient Medications Prior to Visit  Medication Sig Dispense Refill   dapagliflozin propanediol (FARXIGA) 10 MG TABS tablet TAKE 1 TABLET DAILY 90 tablet 0   enalapril (VASOTEC) 20 MG tablet TAKE 2 TABLETS DAILY 180 tablet 3   metFORMIN (GLUCOPHAGE) 1000 MG tablet Take 1 tablet (1,000 mg total) by mouth 2 (two) times daily with a meal. 180 tablet 3   metoprolol succinate (TOPROL-XL) 50 MG 24 hr tablet Take 3 tablets (150 mg total) by mouth daily. Take with or immediately following a meal. 270 tablet 1   rosuvastatin (CRESTOR) 10 MG tablet Take 1 tablet (10 mg total) by mouth daily. 90 tablet 3   Insulin Pen Needle (PEN NEEDLES) 32G X 4 MM MISC 1 each by Does not apply route 2 (two) times daily. 200 each 5   No facility-administered medications prior to visit.    Allergies  Allergen Reactions   Codeine Hives    Review of Systems As per HPI  PE:    11/12/2022    8:04 AM 10/24/2022    9:19 AM 10/06/2022    8:59 AM  Vitals with  BMI  Height  5\' 3"    Weight 208 lbs 6 oz 207 lbs 1 oz 208 lbs 10 oz  BMI 36.93 36.69 34.71  Systolic 134 138 098  Diastolic 91 86 85  Pulse 58 72 64    Physical Exam  Gen: Alert, well appearing.  Patient is oriented to person, place, time, and situation. CV: RRR, no m/r/g.   LUNGS: CTA bilat, nonlabored resps, good aeration in all lung fields. EXT: no clubbing or cyanosis.  no edema.    LABS:  Last CBC Lab Results  Component Value Date   WBC 6.0 09/15/2022   HGB 14.3 09/15/2022   HCT 43.9 09/15/2022   MCV 86.2 09/15/2022   MCH 28.1 09/15/2022   RDW 13.3 09/15/2022   PLT 260 09/15/2022   Last metabolic panel Lab Results  Component Value Date   GLUCOSE 200 (H) 11/10/2022   NA 141 11/10/2022   K 4.9 11/10/2022   CL 105 11/10/2022   CO2 23  11/10/2022   BUN 14 11/10/2022   CREATININE 0.66 11/10/2022   EGFR 105 11/10/2022   CALCIUM 9.0 11/10/2022   PROT 6.3 11/10/2022   ALBUMIN 4.3 11/10/2022   LABGLOB 2.0 11/10/2022   BILITOT 0.3 11/10/2022   ALKPHOS 73 11/10/2022   AST 17 11/10/2022   ALT 25 11/10/2022   ANIONGAP 12 09/15/2022   Last lipids Lab Results  Component Value Date   CHOL 135 11/10/2022   HDL 40 11/10/2022   LDLCALC 78 11/10/2022   LDLDIRECT 140.0 09/24/2020   TRIG 90 11/10/2022   CHOLHDL 3.4 11/10/2022   Last hemoglobin A1c Lab Results  Component Value Date   HGBA1C 7.3 09/04/2022   Last thyroid functions Lab Results  Component Value Date   TSH 4.85 05/14/2022   T3TOTAL 138 05/14/2022   IMPRESSION AND PLAN:  Hypertension, not ideal control. Add HCTZ 12.5 mg a day.  Try to monitor blood pressure at home a few times. Check be met at next follow-up in 10 to 14 days. Continue enalapril 40 mg a day and Toprol-XL 150 mg a day.  An After Visit Summary was printed and given to the patient.  FOLLOW UP: Return for 10 to 14 days follow-up hypertension. Next CPE is February/March 2025. Signed:  Santiago Bumpers, MD           11/12/2022

## 2022-11-13 ENCOUNTER — Telehealth (HOSPITAL_COMMUNITY): Payer: Self-pay | Admitting: *Deleted

## 2022-11-13 NOTE — Telephone Encounter (Signed)
Spoke with pt and gave instructions for stress echo.

## 2022-11-20 ENCOUNTER — Ambulatory Visit (HOSPITAL_COMMUNITY): Payer: BC Managed Care – PPO | Attending: Internal Medicine

## 2022-11-20 ENCOUNTER — Ambulatory Visit (HOSPITAL_COMMUNITY): Payer: BC Managed Care – PPO

## 2022-11-20 ENCOUNTER — Telehealth: Payer: Self-pay

## 2022-11-20 DIAGNOSIS — R079 Chest pain, unspecified: Secondary | ICD-10-CM | POA: Insufficient documentation

## 2022-11-20 DIAGNOSIS — R931 Abnormal findings on diagnostic imaging of heart and coronary circulation: Secondary | ICD-10-CM

## 2022-11-20 LAB — ECHOCARDIOGRAM COMPLETE
Area-P 1/2: 4.67 cm2
S' Lateral: 2.9 cm

## 2022-11-20 MED ORDER — DIPHENHYDRAMINE HCL 50 MG/ML IJ SOLN
50.0000 mg | Freq: Once | INTRAMUSCULAR | Status: AC
Start: 2022-11-20 — End: 2022-11-20
  Administered 2022-11-20: 50 mg via INTRAVENOUS

## 2022-11-20 MED ORDER — PERFLUTREN LIPID MICROSPHERE
1.0000 mL | INTRAVENOUS | Status: AC | PRN
Start: 2022-11-20 — End: 2022-11-20
  Administered 2022-11-20: 2 mL via INTRAVENOUS

## 2022-11-20 NOTE — Telephone Encounter (Signed)
Message from Dr. Melburn Popper Ms Pricilla Holm had an allergic reaction to the definity echo contrast today ( during her stress echo)   the tech was able to complete the regular echo.    shoulder pain, back pain ,  chills, HTN.  we are going to cancel the stress test.   Perhaps a Steffanie Dunn would work better in the future .  Dr. Tomie China requested a lexiscan

## 2022-11-24 ENCOUNTER — Ambulatory Visit: Payer: BC Managed Care – PPO | Admitting: Family Medicine

## 2022-11-26 ENCOUNTER — Encounter (HOSPITAL_COMMUNITY): Payer: Self-pay | Admitting: *Deleted

## 2022-11-26 ENCOUNTER — Ambulatory Visit (HOSPITAL_COMMUNITY): Payer: BC Managed Care – PPO | Attending: Cardiology

## 2022-11-26 DIAGNOSIS — R079 Chest pain, unspecified: Secondary | ICD-10-CM

## 2022-11-26 DIAGNOSIS — R931 Abnormal findings on diagnostic imaging of heart and coronary circulation: Secondary | ICD-10-CM

## 2022-11-26 MED ORDER — TECHNETIUM TC 99M TETROFOSMIN IV KIT
30.7000 | PACK | Freq: Once | INTRAVENOUS | Status: AC | PRN
  Administered 2022-11-26: 30.7 via INTRAVENOUS

## 2022-11-27 ENCOUNTER — Encounter: Payer: Self-pay | Admitting: Family Medicine

## 2022-12-01 ENCOUNTER — Encounter (HOSPITAL_COMMUNITY): Payer: Self-pay

## 2022-12-01 ENCOUNTER — Ambulatory Visit (HOSPITAL_COMMUNITY): Payer: BC Managed Care – PPO

## 2022-12-02 ENCOUNTER — Ambulatory Visit (HOSPITAL_COMMUNITY): Payer: BC Managed Care – PPO | Attending: Cardiology

## 2022-12-02 DIAGNOSIS — R079 Chest pain, unspecified: Secondary | ICD-10-CM | POA: Diagnosis not present

## 2022-12-02 DIAGNOSIS — R931 Abnormal findings on diagnostic imaging of heart and coronary circulation: Secondary | ICD-10-CM | POA: Diagnosis not present

## 2022-12-02 LAB — MYOCARDIAL PERFUSION IMAGING
LV dias vol: 63 mL (ref 46–106)
LV sys vol: 24 mL
Nuc Stress EF: 63 %
Peak HR: 100 {beats}/min
Rest HR: 77 {beats}/min
Rest Nuclear Isotope Dose: 30.7 mCi
SRS: 1
SSS: 0
ST Depression (mm): 0 mm
Stress Nuclear Isotope Dose: 31 mCi
TID: 1.06

## 2022-12-02 MED ORDER — REGADENOSON 0.4 MG/5ML IV SOLN
0.4000 mg | Freq: Once | INTRAVENOUS | Status: AC
  Administered 2022-12-02: 0.4 mg via INTRAVENOUS

## 2022-12-02 MED ORDER — TECHNETIUM TC 99M TETROFOSMIN IV KIT
31.0000 | PACK | Freq: Once | INTRAVENOUS | Status: AC | PRN
  Administered 2022-12-02: 31 via INTRAVENOUS

## 2022-12-03 ENCOUNTER — Ambulatory Visit: Payer: BC Managed Care – PPO | Admitting: Family Medicine

## 2022-12-03 ENCOUNTER — Encounter: Payer: Self-pay | Admitting: Family Medicine

## 2022-12-03 VITALS — BP 110/76 | HR 80 | Temp 98.5°F | Wt 207.4 lb

## 2022-12-03 DIAGNOSIS — M6788 Other specified disorders of synovium and tendon, other site: Secondary | ICD-10-CM | POA: Diagnosis not present

## 2022-12-03 DIAGNOSIS — I10 Essential (primary) hypertension: Secondary | ICD-10-CM

## 2022-12-03 DIAGNOSIS — M79621 Pain in right upper arm: Secondary | ICD-10-CM

## 2022-12-03 DIAGNOSIS — M7660 Achilles tendinitis, unspecified leg: Secondary | ICD-10-CM | POA: Diagnosis not present

## 2022-12-03 MED ORDER — HYDROCHLOROTHIAZIDE 12.5 MG PO TABS: 12.5000 mg | ORAL_TABLET | Freq: Every day | ORAL | 1 refills | Status: DC

## 2022-12-03 NOTE — Progress Notes (Signed)
OFFICE VISIT  12/03/2022  CC:  Chief Complaint  Patient presents with   Follow-up    14 day follow up on BP. She has a knot in her right arm and heel pain that she is concerned about.   Patient is a 51 y.o. female who presents for 3 wk f/u HTN. A/P as of last visit: "Hypertension, not ideal control. Add HCTZ 12.5 mg a day.  Try to monitor blood pressure at home a few times. Check be met at next follow-up in 10 to 14 days. Continue enalapril 40 mg a day and Toprol-XL 150 mg a day."  INTERIM HX: Home blood pressures consistently normal.  She has chronic left Achilles pain, at least for the last 9 months.  Points to the mid tendon level.   She plays a lot of tennis.  She has not altered her playing schedule much.  Has pretty much push through the pain.  No acute swelling.  She had some dry needling done and some PT done.  Also having some right arm pain anteriorly about 3-4 cm proximal to the antecubital fossa.  Does not feel arm weakness.  The arm did not hurt at rest.  Hurts mainly with push-ups and elbow flexion.  She does note that it hurts some during tennis.  ROS as above, plus--> no fevers, no CP, no SOB, no dizziness, no Has.  No arthralgias.  No focal weakness, paresthesias, or tremors.  No acute vision or hearing abnormalities.   No palpitations.     Past Medical History:  Diagnosis Date   Acute DVT (deep venous thrombosis) (HCC)    DVT 2012-january, L leg, in the context of post-C section. Again 2021, L leg, dx'd shortly after having covid infection->took eliquis x 54mo.   Cholelithiasis without obstruction 09/19/09 (u/s)   COVID-19 virus infection    approx 11/09/19---got antibody infusion   Diabetes mellitus without complication (HCC) 03/21/2010   Elevated transaminase level 07/19/2012   Essential hypertension 03/21/2010   Qualifier: Diagnosis of   By: Gabriel Rung LPN, Enid Baas liver 09/19/09 (u/s)   Mildly elevated transaminases   GERD 03/21/2010   History of  adenomatous polyp of colon 12/2018   2020.  Normal 01/14/22. Recall 5 yrs   History of herpes zoster 03/2012   Hyperlipidemia 03/21/2010   Qualifier: Diagnosis of   By: Gabriel Rung LPN, Harriett Sine         Melanocytic nevi, unspecified 12/30/2018   NAFLD (nonalcoholic fatty liver disease) 40/98/1191   Obesity, Class II, BMI 35-39.9    Paresthesia of left arm 09/30/2012   Seizures (HCC)    as child- had 2 none since- no current treatment- never dx'd with any seizure d/o    Subclinical hypothyroidism 2021   2021-22->multinod goiter on u/s 01/2021    Past Surgical History:  Procedure Laterality Date   CESAREAN SECTION     2003, 2005   COLONOSCOPY  01/05/2019   2020 polyps.  01/14/22 no polyps. Recall 5 yrs.   Coronary calcium score  10/2022   Score 26, 90th%'tile   LAPAROSCOPIC HYSTERECTOMY  02/2014   Ovaries remain (tubes out): done for DUB   TRANSTHORACIC ECHOCARDIOGRAM     11/2022 EF 55-60%, grd I DD, AV sclerosis w/out stenosis.   TUBAL LIGATION  2011   Dr. Malachy Mood is her GYN.   WISDOM TOOTH EXTRACTION      Outpatient Medications Prior to Visit  Medication Sig Dispense Refill   celecoxib (  CELEBREX) 100 MG capsule Take 100 mg by mouth 2 (two) times daily.     dapagliflozin propanediol (FARXIGA) 10 MG TABS tablet TAKE 1 TABLET DAILY 90 tablet 0   enalapril (VASOTEC) 20 MG tablet TAKE 2 TABLETS DAILY 180 tablet 3   metFORMIN (GLUCOPHAGE) 1000 MG tablet Take 1 tablet (1,000 mg total) by mouth 2 (two) times daily with a meal. 180 tablet 3   metoprolol succinate (TOPROL-XL) 50 MG 24 hr tablet Take 3 tablets (150 mg total) by mouth daily. Take with or immediately following a meal. 270 tablet 1   rosuvastatin (CRESTOR) 10 MG tablet Take 1 tablet (10 mg total) by mouth daily. 90 tablet 3   hydrochlorothiazide (HYDRODIURIL) 12.5 MG tablet Take 1 tablet (12.5 mg total) by mouth daily. 30 tablet 0   No facility-administered medications prior to visit.    Allergies  Allergen Reactions    Definity [Perflutren Lipid Microsphere] Hypertension     shoulder pain, back pain , chills and HTN   Codeine Hives    Review of Systems As per HPI  PE:    12/03/2022    3:47 PM 11/26/2022   10:09 AM 11/12/2022    8:04 AM  Vitals with BMI  Height  5\' 3"    Weight 207 lbs 6 oz 208 lbs 208 lbs 6 oz  BMI  36.85 36.93  Systolic 110  134  Diastolic 76  91  Pulse 80  58     Physical Exam  Gen: Alert, well appearing.  Patient is oriented to person, place, time, and situation. Left heel with some tenderness to palpation over the lower quarter of Achilles tendon extending down to its insertion on the calcaneus. There is a palpable nodular region of her tendon just proximal to its insertion. Calf without atrophy or hypertrophy.  Good plantar flexion strength. No edema. Right upper arm with mild tenderness to palpation over distal biceps area.  Mild reproduction of the pain with resisted flexion of the forearm. resisted supination does not elicit the pain.  Bedside ultrasound today: LEFT HEEL->She has a few subtle distal Achilles calcifications as well as hypoechoic changes diffusely in the distal half of the tendon.  There is a distinct fusiform area of significant increased tendon thickness.  Small area of peritendinous anechoic change.  It does appear that she has a few intrasubstance tears when viewed in short axis.   No hyperemia. Spur present on the calcaneus at Achilles insertion. No retrocalcaneal or retro-Achilles bursitis.  Right upper arm-> no abnormality of the distal biceps at the musculotendinous junction.  Distal biceps tendon normal.  Brachialis muscle and tendon appeared normal.  Normal anterior elbow joint recess.  LABS:  Last metabolic panel Lab Results  Component Value Date   GLUCOSE 200 (H) 11/10/2022   NA 141 11/10/2022   K 4.9 11/10/2022   CL 105 11/10/2022   CO2 23 11/10/2022   BUN 14 11/10/2022   CREATININE 0.66 11/10/2022   EGFR 105 11/10/2022   CALCIUM  9.0 11/10/2022   PROT 6.3 11/10/2022   ALBUMIN 4.3 11/10/2022   LABGLOB 2.0 11/10/2022   BILITOT 0.3 11/10/2022   ALKPHOS 73 11/10/2022   AST 17 11/10/2022   ALT 25 11/10/2022   ANIONGAP 12 09/15/2022    IMPRESSION AND PLAN:  #1 hypertension, well-controlled.  Continue HCTZ 12.5 mg a day, enalapril 40 mg a day, and Toprol-XL 150 mg a day.  #2 chronic left Achilles pain, tendinosis. Discussed need for relative rest/activity modification. She  already takes Celebrex 100 mg twice daily and this gives her some relief. We discussed possible use of a walking boot for a while. Ultimately, we decided to refer to sports medicine--> Dr. Benjamin Stain.  #3 right upper arm pain. Etiology either distal biceps at the musculotendinous junction or possibly more focused in the brachialis muscle region. Relative rest and activity modification discussed.  Ice after activity.  An After Visit Summary was printed and given to the patient.  FOLLOW UP: Return in about 6 months (around 06/02/2023) for annual CPE (fasting). Next cpe feb/mar 2025 Signed:  Santiago Bumpers, MD           12/03/2022

## 2022-12-08 ENCOUNTER — Encounter: Payer: Self-pay | Admitting: Family Medicine

## 2022-12-09 DIAGNOSIS — E1165 Type 2 diabetes mellitus with hyperglycemia: Secondary | ICD-10-CM | POA: Diagnosis not present

## 2022-12-10 DIAGNOSIS — E785 Hyperlipidemia, unspecified: Secondary | ICD-10-CM | POA: Diagnosis not present

## 2022-12-11 ENCOUNTER — Encounter: Payer: Self-pay | Admitting: Sports Medicine

## 2022-12-11 ENCOUNTER — Ambulatory Visit: Payer: BC Managed Care – PPO

## 2022-12-11 ENCOUNTER — Ambulatory Visit: Payer: BC Managed Care – PPO | Admitting: Sports Medicine

## 2022-12-11 DIAGNOSIS — M79605 Pain in left leg: Secondary | ICD-10-CM

## 2022-12-11 DIAGNOSIS — M7989 Other specified soft tissue disorders: Secondary | ICD-10-CM | POA: Diagnosis not present

## 2022-12-11 DIAGNOSIS — G8929 Other chronic pain: Secondary | ICD-10-CM | POA: Insufficient documentation

## 2022-12-11 DIAGNOSIS — M79662 Pain in left lower leg: Secondary | ICD-10-CM | POA: Diagnosis not present

## 2022-12-11 DIAGNOSIS — I824Y2 Acute embolism and thrombosis of unspecified deep veins of left proximal lower extremity: Secondary | ICD-10-CM

## 2022-12-11 DIAGNOSIS — M25521 Pain in right elbow: Secondary | ICD-10-CM

## 2022-12-11 DIAGNOSIS — I824Y1 Acute embolism and thrombosis of unspecified deep veins of right proximal lower extremity: Secondary | ICD-10-CM

## 2022-12-11 DIAGNOSIS — I82409 Acute embolism and thrombosis of unspecified deep veins of unspecified lower extremity: Secondary | ICD-10-CM | POA: Diagnosis not present

## 2022-12-11 HISTORY — DX: Pain in left leg: M79.605

## 2022-12-11 LAB — LIPID PANEL
Chol/HDL Ratio: 3.9 ratio (ref 0.0–4.4)
Cholesterol, Total: 159 mg/dL (ref 100–199)
HDL: 41 mg/dL (ref >39)
LDL Chol Calc (NIH): 95 mg/dL (ref 0–99)
Triglycerides: 126 mg/dL (ref 0–149)
VLDL Cholesterol Cal: 23 mg/dL (ref 5–40)

## 2022-12-11 LAB — HEPATIC FUNCTION PANEL
ALT: 25 IU/L (ref 0–32)
AST: 17 IU/L (ref 0–40)
Albumin: 4.3 g/dL (ref 3.8–4.9)
Alkaline Phosphatase: 83 IU/L (ref 44–121)
Bilirubin Total: 0.3 mg/dL (ref 0.0–1.2)
Bilirubin, Direct: <0.1 mg/dL (ref 0.00–0.40)
Total Protein: 6.8 g/dL (ref 6.0–8.5)

## 2022-12-11 MED ORDER — CELECOXIB 100 MG PO CAPS: ORAL_CAPSULE | ORAL | 11 refills | Status: DC

## 2022-12-11 NOTE — Assessment & Plan Note (Addendum)
As above this is a very pleasant 52 year old female with a long history of left leg pain, she localizes the pain at the medial head of the calf musculotendinous junction, present for several months now, this is in spite of conservative treatment. 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 Tenderness is not present at the distal Achilles, retrocalcaneal or calcaneal bursae. Negative Thompson's test, positive Homans' sign. We will proceed with DVT ultrasound, x-rays of the tib-fib and MRI considering failure of conservative treatment. Adding some heel lifts as well. For insurance coverage purposes tibial stress injury is in the differential.  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

## 2022-12-11 NOTE — Assessment & Plan Note (Signed)
This is a pleasant 52 year old female, she has a 67-month history of pain right elbow lateral aspect, but also anterior aspect. She has been increasing her workout regimen. On exam she has good motion, good strength, we are able to reproduce pain with resisted flexion of the elbow, there is also minimal reproduction of pain with resisted supination, she did have a negative speeds test, minimal tenderness over the anconeus and anteriorly. She has been working with her primary care provider for over 6 weeks, she has at this point failed greater than 6 weeks of home physical therapy, Celebrex 100 mg daily. We are going to proceed with x-rays, MRI, double Celebrex 200 mg twice daily, home conditioning given. Differential includes brachialis/biceps tendinopathy versus elbow osteoarthritis. Return to see me to go over MRI results.

## 2022-12-11 NOTE — Assessment & Plan Note (Signed)
Sounds like multiple DVTs, all provoked, the first was in the context of a C-section, she also had 1 diagnosed after COVID. We will check again due to left leg swelling and pain. If another DVT is found I would suggest lifelong anticoagulation.

## 2022-12-11 NOTE — Assessment & Plan Note (Signed)
I have asked Kristen Meyers to talk to her PCP again about weight loss treatment, she is already trying diet and exercise and not making a ton of headway, she did have some difficulty tolerating Ozempic, I did advise her that tirzepatide which is the active ingredient in Zepbound and [place] is far better tolerated and she can ask her PCP about it. In the end potentially using the compounded formulation would be an option as well.

## 2022-12-11 NOTE — Progress Notes (Addendum)
    Procedures performed today:    None.  Independent interpretation of notes and tests performed by another provider:   None.  Brief History, Exam, Impression, and Recommendations:    Chronic elbow pain, right This is a pleasant 52 year old female, she has a 20-month history of pain right elbow lateral aspect, but also anterior aspect. She has been increasing her workout regimen. On exam she has good motion, good strength, we are able to reproduce pain with resisted flexion of the elbow, there is also minimal reproduction of pain with resisted supination, she did have a negative speeds test, minimal tenderness over the anconeus and anteriorly. She has been working with her primary care provider for over 6 weeks, she has at this point failed greater than 6 weeks of home physical therapy, Celebrex 100 mg daily. We are going to proceed with x-rays, MRI, double Celebrex 200 mg twice daily, home conditioning given. Differential includes brachialis/biceps tendinopathy versus elbow osteoarthritis. Return to see me to go over MRI results.  Left leg pain As above this is a very pleasant 52 year old female with a long history of left leg pain, she localizes the pain at the medial head of the calf musculotendinous junction, present for several months now, this is in spite of conservative treatment. 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 Tenderness is not present at the distal Achilles, retrocalcaneal or calcaneal bursae. Negative Thompson's test, positive Homans' sign. We will proceed with DVT ultrasound, x-rays of the tib-fib and MRI considering failure of conservative treatment. Adding some heel lifts as well. For insurance coverage purposes tibial stress injury is in the differential.  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  Obesity, morbid (HCC) I have asked  Danyetta to talk to her PCP again about weight loss treatment, she is already trying diet and exercise and not making a ton of headway, she did have some difficulty tolerating Ozempic, I did advise her that tirzepatide which is the active ingredient in Zepbound and [place] is far better tolerated and she can ask her PCP about it. In the end potentially using the compounded formulation would be an option as well.  Acute DVT (deep venous thrombosis) (HCC) Sounds like multiple DVTs, all provoked, the first was in the context of a C-section, she also had 1 diagnosed after COVID. We will check again due to left leg swelling and pain. If another DVT is found I would suggest lifelong anticoagulation.    ____________________________________________ Ihor Austin. Benjamin Stain, M.D., ABFM., CAQSM., AME. Primary Care and Sports Medicine [facility]  Adjunct Professor of Family Medicine  [place] [employer] [facility] [employer] [employer]  Restaurant manager, fast food

## 2022-12-12 ENCOUNTER — Telehealth: Payer: Self-pay

## 2022-12-12 DIAGNOSIS — E785 Hyperlipidemia, unspecified: Secondary | ICD-10-CM

## 2022-12-12 MED ORDER — NITROGLYCERIN 0.2 MG/HR TD PT24
MEDICATED_PATCH | TRANSDERMAL | 11 refills | Status: AC
Start: 2022-12-12 — End: ?

## 2022-12-12 MED ORDER — ROSUVASTATIN CALCIUM 20 MG PO TABS: 20.0000 mg | ORAL_TABLET | Freq: Every day | ORAL | 3 refills | Status: DC

## 2022-12-12 NOTE — Telephone Encounter (Signed)
-----   Message from Odessa Revankar sent at 12/12/2022  4:07 PM EDT ----- Double statin and liver lipid check in 6 weeks.  Copy primary care ----- Message ----- From: Eleonore Chiquito, RN Sent: 12/12/2022   2:39 PM EDT To: Garwin Brothers, MD  Pt has been taking her Rosuvastatin 10 mg daily and not missed any doses. ----- Message ----- From: Garwin Brothers, MD Sent: 12/11/2022   8:28 AM EDT To: Eleonore Chiquito, RN  Diet, exercise.  It appears patient is not taking any statin.  Rosuvastatin 10 mg daily liver lipid check in 6 weeks.  Copy primary Garwin Brothers, MD 12/11/2022 8:28 AM

## 2022-12-12 NOTE — Addendum Note (Signed)
Addended by: Monica Becton on: 12/12/2022 09:33 PM   Modules accepted: Orders

## 2022-12-17 NOTE — Therapy (Unsigned)
OUTPATIENT PHYSICAL THERAPY LOWER EXTREMITY EVALUATION   Patient Name: Kristen Meyers MRN: [id number] DOB:08-20-1970, 52 y.o., female Today's Date: 12/18/2022  END OF SESSION:  PT End of Session - 12/18/22 1740     Visit Number 1    Number of Visits 24    Date for PT Re-Evaluation 03/12/23    Authorization Type [employer] copay 40    Authorization Time Period year - 54 remaing for year per TL    Authorization - Visit Number 19    Authorization - Number of Visits 60    PT Start Time 1615    PT Stop Time 1707    PT Time Calculation (min) 52 min    Activity Tolerance Patient tolerated treatment well             Past Medical History:  Diagnosis Date   Acute DVT (deep venous thrombosis) (HCC)    DVT 2012-january, L leg, in the context of post-C section. Again 2021, L leg, dx'd shortly after having covid infection->took eliquis x 47mo.   Cholelithiasis without obstruction 09/19/09 (u/s)   COVID-19 virus infection    approx 11/09/19---got antibody infusion   Diabetes mellitus without complication (HCC) 03/21/2010   Elevated transaminase level 07/19/2012   Essential hypertension 03/21/2010   Qualifier: Diagnosis of   By: Gabriel Rung LPN, Enid Baas liver 09/19/09 (u/s)   Mildly elevated transaminases   GERD 03/21/2010   History of adenomatous polyp of colon 12/2018   2020.  Normal 01/14/22. 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  01/14/22 no polyps. Recall 5 yrs.   Coronary calcium score  10/2022   Score 26, 90th%'tile   LAPAROSCOPIC HYSTERECTOMY  02/2014   Ovaries remain (tubes out): done for DUB   TRANSTHORACIC ECHOCARDIOGRAM     11/2022 EF 55-60%, grd I DD, AV sclerosis w/out stenosis.   TUBAL LIGATION  2011   Dr. Malachy Mood is her GYN.   WISDOM TOOTH EXTRACTION     Patient Active Problem List   Diagnosis Date Noted   Chronic elbow pain, right 12/11/2022   Left leg pain 12/11/2022   Chest pain of uncertain etiology 10/24/2022   Cardiac murmur 10/24/2022   Acute DVT (deep venous thrombosis) (HCC)    Cholelithiasis without obstruction    COVID-19 virus infection    Fatty liver    Obesity, Class II, BMI 35-39.9    Seizures (HCC)    History of laparoscopic-assisted vaginal hysterectomy 02/14/2020   Deep venous thrombosis (HCC) 02/14/2020   Subclinical hypothyroidism 2021   Melanocytic nevi, unspecified 12/30/2018   History of adenomatous polyp of colon 12/2018   Diabetes mellitus without complication (HCC) 06/22/2015   Health maintenance examination 07/20/2013   NAFLD (  nonalcoholic fatty liver disease) 40/98/1191   Obesity, morbid (HCC) 10/19/2012   Paresthesia of left arm 09/30/2012   Low back pain 09/30/2012   Left shoulder pain 09/30/2012   Type II or unspecified type diabetes mellitus without mention of complication, uncontrolled 07/19/2012   Elevated transaminase level 07/19/2012   History of herpes zoster 03/2012   Hyperlipidemia 03/21/2010   Essential hypertension 03/21/2010   GERD 03/21/2010    PCP: Dr Earley Favor  REFERRING PROVIDER: Dr Rodney Langton  REFERRING DIAG: L leg pain   THERAPY DIAG:  Pain of left calf  Other symptoms and signs involving the musculoskeletal system  Muscle weakness (generalized)  Other abnormalities of gait and mobility  Rationale for Evaluation and Treatment:  Rehabilitation  ONSET DATE: 08/16/22  SUBJECTIVE:   SUBJECTIVE STATEMENT: Patient reports that she has had L leg pain for several months with no known injury. Symptoms have increased in the past month. She has pain and inflammation in the L leg on constant basis now. Pain was intermittent prior to the past month. Had pain in the L knee 3/24 "floating knee cap", then L calf pain. Went to PT with some improvement in the knee with exercise and DN but she stated having pain again 6/24  PERTINENT HISTORY: AODM; HTN; elevated cholesterol; knee pain bilat; R elbow pain for ~ past 6-7 months did PT for tennis elbow with some improvement then symptoms started getting worse; history ot L LE DVT's 2003 and 2021  PAIN:  Are you having pain? Yes: NPRS scale: 3-4/10; worst 6-7/10 Pain location: L leg  Pain description: burning; aching back of leg to achilles area; tight  Aggravating factors: tennis; driving; standing; walking; sitting at desk or upright position Relieving factors: recliner; OTC meds; ice  PRECAUTIONS: Other: history ot L LE DVT's 2003 and 2021 - [place] negative for DVT's 12/11/22  RED FLAGS: None   WEIGHT BEARING RESTRICTIONS: No  FALLS:  Has patient fallen in last 6 months? 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  DIAGNOSTIC FINDINGS: 12/11/22: Venous Img Lower L leg; Directed duplex of the left lower extremity negative for DVT    PATIENT SURVEYS:  FOTO   COGNITION: Overall cognitive status: Within functional limits for tasks assessed     SENSATION: WFL  EDEMA:  none  MUSCLE LENGTH: Hamstrings: Right 65 deg; Left 60 deg Thomas test:not tested - tight hip flexors to palpation   POSTURE: rounded shoulders, forward head, flexed trunk , and weight shift right  PALPATION: Tenderness and tightness to palpation L > R hip flexors; posterior hip; gastroc-soleus complex   LOWER EXTREMITY ROM: tight L > R hip  Active ROM Right eval Left eval  Hip flexion    Hip extension    Hip abduction    Hip adduction    Hip internal rotation    Hip external rotation    Knee flexion    Knee extension  Ankle dorsiflexion 9 5  Ankle plantarflexion    Ankle inversion 38 35  Ankle eversion 28 21 pain anterior leg    (Blank rows = not tested)  LOWER EXTREMITY MMT:  MMT Right eval Left eval  Hip flexion 4+ 4+  Hip extension 3+ 3+  Hip abduction 4 4-  Hip adduction    Hip internal rotation    Hip external rotation    Knee flexion 5 5  Knee extension 5 4-  Ankle dorsiflexion 4 4-  Ankle plantarflexion    Ankle inversion 5 5-  Ankle eversion 5 5-   (Blank rows = not tested)  FUNCTIONAL TESTS:  5 times sit to stand: 10.41 sec use of UE's for momentum   GAIT: Distance walked: 40 feet  Assistive device utilized: None Level of assistance: Complete Independence Comments: decreased wt bearing with notable limp with stance to toe off on L    [facility] Adult PT Treatment:                                                DATE: 12/18/22 Therapeutic Exercise: Supine  Piriformis stretch travell 30 sec x 2  Hamstring stretch w/strap 30 sec x 2  ITB stretch w/strap 30 sec x 2  Standing  Gasoc stretch 30 sec  x 2 foot perpendicular to surface  Soleus stretch 30 sec x 2 foot perpendicular to surface Manual Therapy: Add dry needling and manual work L > R calf  Neuromuscular re-ed: Avoid standing with knees locked   Self Care: Education re- chronic, recurrent musculoskeletal injuries. Suggestions for activity level. Hold tennis. Can continue with weight training. Be sure feet are flat on the surface of the machine plate. Ascend steps with flat feet instead of on toes. Stand without hyperextension of knees.     PATIENT EDUCATION:  Education details: POC; HEP  Person educated: Patient Education method: Programmer, multimedia, Demonstration, Actor cues, Verbal cues, and Handouts Education comprehension: verbalized understanding, returned demonstration, verbal cues required, tactile cues required, and needs further education  HOME EXERCISE PROGRAM: Access Code: [email or web address] URL: [email or web address] Date: 12/18/2022 Prepared by: Corlis Leak  Exercises - Supine Piriformis Stretch with Leg Straight  - 2 x daily - 7 x weekly - 1 sets - 3 reps - 30 sec  hold - Hooklying Hamstring Stretch with Strap  - 2 x daily - 7 x weekly - 1 sets - 3 reps - 30 sec  hold - Supine ITB Stretch with Strap  - 2 x daily - 7 x weekly - 1 sets - 3 reps - 30 sec  hold - Gastroc Stretch on Wall  - 2 x daily - 7 x weekly - 1 sets - 3 reps - 30 sec  hold - Soleus Stretch on Wall  - 2 x daily - 7 x weekly - 1 sets - 3 reps - 30 sec  hold  ASSESSMENT:  CLINICAL IMPRESSION: Patient is a 52 y.o. female who was seen today for physical therapy evaluation and treatment for L leg pain. She reports initial pain in the L knee and into L calf area at least 7 months ago. She had PT for the knee and dry needling for the calf with some improvement but then experienced pain in the L calf again beginning in June with symptoms increasing in the past month. Patient has no know injury.  She does have a history of DVT's in 2003 and 2021. [place] 9/24 negative for DVTs. Patient presents with abnormal gait pattern; muscular tightness to palpation; decreased ROM; weakness in L > R hips; knees and ankles. Patient will benefit from comprehensive PT program to  address LE symptoms.   OBJECTIVE IMPAIRMENTS: Abnormal gait, decreased activity tolerance, decreased ROM, decreased strength, hypomobility, impaired flexibility, improper body mechanics, postural dysfunction, obesity, and pain.   ACTIVITY LIMITATIONS: bending, sitting, standing, squatting, stairs, and locomotion level  PARTICIPATION LIMITATIONS: meal prep, cleaning, laundry, driving, shopping, community activity, occupation, and yard work  PERSONAL FACTORS: Behavior pattern, Fitness, Past/current experiences, and Time since onset of injury/illness/exacerbation are also affecting patient's functional outcome.   REHAB POTENTIAL: Good  CLINICAL DECISION MAKING: Stable/uncomplicated  EVALUATION COMPLEXITY: Low   GOALS: Goals reviewed with patient? Yes  SHORT TERM GOALS: Target date: 01/29/2023   Independent in initial HEP  Baseline: Goal status: INITIAL  2.  Increase AROM L bilat ankle DF by 3-5 degrees  Baseline:  Goal status: INITIAL   LONG TERM GOALS: Target date: 03/12/2023   Decrease pain L calf by 75-100% allowing patient to return to all normal functional activities  Baseline:  Goal status: INITIAL  2.  5/5 strength bilat LE's  Baseline:  Goal status: INITIAL  3.  Ambulation for 20-30 min with pain no greater than 1-2/10 and no limp  Baseline:  Goal status: INITIAL  4.  Patient returns to full functional and recreational activities including return to tennis  Baseline:  Goal status: INITIAL  5.  Independent in HEP, including aquatic therapy as indicated  Baseline:  Goal status: INITIAL  6.  Improve functional limitation 60  Baseline: 48 Goal status: INITIAL   PLAN:  PT FREQUENCY: 2x/week  PT DURATION: 12 weeks  PLANNED INTERVENTIONS: Therapeutic exercises, Therapeutic activity, Neuromuscular re-education, Balance training, Gait training, Patient/Family education, Self Care, Joint mobilization, Aquatic Therapy, and Dry Needling; modalities; manual  work ;   Higher education careers adviser FOR NEXT SESSION: review and progress HEP; manual work, DN,modalities as indicated - need to address LE symptoms   Add hip flexor stretch (sits at desk/computer all day); add hip abduction and extension strengthening; DN and STM/IASTM for calf L/R   Referral from Dr T for eccentric strengthening    Val Riles, PT 12/18/2022, 5:43 PM

## 2022-12-18 ENCOUNTER — Other Ambulatory Visit: Payer: Self-pay

## 2022-12-18 ENCOUNTER — Encounter: Payer: Self-pay | Admitting: Rehabilitative and Restorative Service Providers"

## 2022-12-18 ENCOUNTER — Ambulatory Visit: Payer: BC Managed Care – PPO | Attending: Sports Medicine | Admitting: Rehabilitative and Restorative Service Providers"

## 2022-12-18 DIAGNOSIS — R2689 Other abnormalities of gait and mobility: Secondary | ICD-10-CM

## 2022-12-18 DIAGNOSIS — M6281 Muscle weakness (generalized): Secondary | ICD-10-CM | POA: Diagnosis not present

## 2022-12-18 DIAGNOSIS — M79662 Pain in left lower leg: Secondary | ICD-10-CM | POA: Diagnosis not present

## 2022-12-18 DIAGNOSIS — R29898 Other symptoms and signs involving the musculoskeletal system: Secondary | ICD-10-CM

## 2022-12-19 ENCOUNTER — Telehealth: Payer: Self-pay | Admitting: Sports Medicine

## 2022-12-19 NOTE — Telephone Encounter (Signed)
I performed a peer to peer today, I got approvals for both the tib-fib and the elbow MRI, approval #[id number], valid 12/18/2022 through 01/16/2023, please get patient scheduled ASAP.

## 2022-12-21 ENCOUNTER — Other Ambulatory Visit: Payer: BC Managed Care – PPO

## 2022-12-21 DIAGNOSIS — M25521 Pain in right elbow: Secondary | ICD-10-CM

## 2022-12-21 DIAGNOSIS — S46211A Strain of muscle, fascia and tendon of other parts of biceps, right arm, initial encounter: Secondary | ICD-10-CM | POA: Diagnosis not present

## 2022-12-21 DIAGNOSIS — M79662 Pain in left lower leg: Secondary | ICD-10-CM | POA: Diagnosis not present

## 2022-12-21 DIAGNOSIS — G8929 Other chronic pain: Secondary | ICD-10-CM

## 2022-12-21 DIAGNOSIS — M79605 Pain in left leg: Secondary | ICD-10-CM

## 2022-12-22 NOTE — Therapy (Signed)
OUTPATIENT PHYSICAL THERAPY LOWER EXTREMITY TREATMENT   Patient Name: Kristen Meyers MRN: [id number] DOB:[birth year], 52 y.o., female Today's Date: 12/23/2022  END OF SESSION:  PT End of Session - 12/23/22 0804     Visit Number 2    Number of Visits 24    Date for PT Re-Evaluation 03/12/23    Authorization Type [employer] copay 40    Authorization Time Period year - 20 remaing for year per TL    Authorization - Visit Number 20    Authorization - Number of Visits 60    PT Start Time 0804    PT Stop Time 0848    PT Time Calculation (min) 44 min    Activity Tolerance Patient tolerated treatment well    Behavior During Therapy WFL for tasks assessed/performed              Past Medical History:  Diagnosis Date   Acute DVT (deep venous thrombosis) (HCC)    DVT 2012-january, L leg, in the context of post-C section. Again 2021, L leg, dx'd shortly after having covid infection->took eliquis x 47mo.   Cholelithiasis without obstruction 09/19/09 (u/s)   COVID-19 virus infection    approx 11/09/19---got antibody infusion   Diabetes mellitus without complication (HCC) 03/21/2010   Elevated transaminase level 07/19/2012   Essential hypertension 03/21/2010   Qualifier: Diagnosis of   By: Gabriel Rung LPN, Enid Baas liver 09/19/09 (u/s)   Mildly elevated transaminases   GERD 03/21/2010   History of adenomatous polyp of colon 12/2018   2020.  Normal 01/14/22. 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  01/14/22 no polyps. Recall 5 yrs.   Coronary calcium score  10/2022   Score 26, 90th%'tile   LAPAROSCOPIC HYSTERECTOMY  02/2014   Ovaries remain (tubes out): done for DUB   TRANSTHORACIC ECHOCARDIOGRAM     11/2022 EF 55-60%, grd I DD, AV sclerosis w/out stenosis.   TUBAL LIGATION  2011   Dr. Malachy Mood is her GYN.   WISDOM TOOTH EXTRACTION     Patient Active Problem List   Diagnosis Date Noted   Chronic elbow pain, right 12/11/2022   Left leg pain 12/11/2022   Chest pain of uncertain etiology 10/24/2022   Cardiac murmur 10/24/2022   Acute DVT (deep venous thrombosis) (HCC)    Cholelithiasis without obstruction    COVID-19 virus infection    Fatty liver    Obesity, Class II, BMI 35-39.9    Seizures (HCC)    History of laparoscopic-assisted vaginal hysterectomy 02/14/2020   Deep venous thrombosis (HCC) 02/14/2020   Subclinical hypothyroidism 2021   Melanocytic nevi, unspecified 12/30/2018   History of adenomatous polyp of colon 12/2018   Diabetes mellitus without complication (  HCC) 06/22/2015   Health maintenance examination 07/20/2013   NAFLD (nonalcoholic fatty liver disease) 30/86/5784   Obesity, morbid (HCC) 10/19/2012   Paresthesia of left arm 09/30/2012   Low back pain 09/30/2012   Left shoulder pain 09/30/2012   Type II or unspecified type diabetes mellitus without mention of complication, uncontrolled 07/19/2012   Elevated transaminase level 07/19/2012   History of herpes zoster 03/2012   Hyperlipidemia 03/21/2010   Essential hypertension 03/21/2010   GERD 03/21/2010    PCP: Dr Earley Favor  REFERRING PROVIDER: Dr Rodney Langton  REFERRING DIAG: L leg pain   THERAPY DIAG:  Pain of left calf  Other symptoms and signs involving the musculoskeletal system  Muscle weakness (generalized)  Other abnormalities of gait and  mobility  Rationale for Evaluation and Treatment: Rehabilitation  ONSET DATE: 08/16/22  SUBJECTIVE:   SUBJECTIVE STATEMENT: Patient reports feeling her pain when she got out of her car, but after a few steps it goes away.   Eval: Patient reports that she has had L leg pain for several months with no known injury. Symptoms have increased in the past month. She has pain and inflammation in the L leg on constant basis now. Pain was intermittent prior to the past month. Had pain in the L knee 3/24 "floating knee cap", then L calf pain. Went to PT with some improvement in the knee with exercise and DN but she stated having pain again 6/24  PERTINENT HISTORY: AODM; HTN; elevated cholesterol; knee pain bilat; R elbow pain for ~ past 6-7 months did PT for tennis elbow with some improvement then symptoms started getting worse; history ot L LE DVT's 2003 and 2021  PAIN:  Are you having pain? Yes: NPRS scale: 0/10 today; worst 6-7/10 Pain location: L leg  Pain description: burning; aching back of leg to achilles area; tight  Aggravating factors: tennis; driving; standing; walking; sitting at desk or upright position Relieving factors: recliner; OTC meds; ice  PRECAUTIONS: Other: history ot L LE DVT's 2003 and 2021 - [place] negative for DVT's 12/11/22  RED FLAGS: None   WEIGHT BEARING RESTRICTIONS: No  FALLS:  Has patient fallen in last 6 months? 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  DIAGNOSTIC FINDINGS: 12/11/22: Venous Img Lower L leg; Directed duplex of the left lower extremity negative for DVT   PATIENT SURVEYS:  FOTO   COGNITION: Overall cognitive status: Within functional limits for tasks assessed     SENSATION: WFL  EDEMA:  none  MUSCLE LENGTH: Hamstrings: Right 65 deg; Left 60 deg Thomas test:not tested - tight hip flexors to palpation   POSTURE: rounded shoulders, forward head, flexed trunk , and weight shift right  PALPATION: Tenderness and tightness to palpation L > R hip flexors; posterior hip; gastroc-soleus complex   LOWER EXTREMITY ROM: tight L > R hip  Active ROM Right eval Left eval  Hip flexion    Hip extension  Hip abduction    Hip adduction    Hip internal rotation    Hip external rotation    Knee flexion    Knee extension    Ankle dorsiflexion 9 5  Ankle plantarflexion    Ankle inversion 38 35  Ankle eversion 28 21 pain anterior leg    (Blank rows = not tested)  LOWER EXTREMITY MMT:  MMT Right eval Left eval  Hip flexion 4+ 4+  Hip extension 3+ 3+  Hip abduction 4 4-  Hip adduction    Hip internal rotation    Hip external rotation    Knee flexion 5 5  Knee extension 5 4-  Ankle dorsiflexion 4 4-  Ankle plantarflexion    Ankle inversion 5 5-  Ankle eversion 5 5-   (Blank rows = not tested)  FUNCTIONAL TESTS:  5 times sit to stand: 10.41 sec use of UE's for momentum   GAIT: Distance walked: 40 feet  Assistive device utilized: None Level of assistance: Complete Independence Comments: decreased wt bearing with notable limp with stance to toe off on L     [facility] Adult PT Treatment:                                                DATE: 12/23/22 Therapeutic Exercise: Supine  Piriformis stretch travell 30 sec x 2  Hamstring stretch w/strap 30 sec x 2  ITB stretch w/strap 30 sec x 2  Supine quad stretch with strap off EOB 2x30 sec Prone hip ext x 10 ea, then  with 3# AW 3 sec hold 2 x 10 B Sidelying hip ABD 3#AW 2x10 B Standing  Gasoc stretch 30 sec  x 2 foot perpendicular to surface  Soleus stretch 30 sec x 2 foot perpendicular to surface Eccentric heel raises - B 3 sec up, 3 sec hold and 3 sec down  x 10, then with only 50% pressure through the R foot;  full L foot x 10, REST 1.5 min between sets;  then on stairs x 10  Manual Therapy: STM to L gastroc/soleus med > lateral  [facility] Adult PT Treatment:                                                DATE: 12/18/22 Therapeutic Exercise: Supine  Piriformis stretch travell 30 sec x 2  Hamstring stretch w/strap 30 sec x 2  ITB stretch w/strap 30 sec x 2  Standing  Gasoc stretch 30 sec  x 2 foot perpendicular to surface  Soleus stretch 30 sec x 2 foot perpendicular to surface Manual Therapy: Add dry needling and manual work L > R calf  Neuromuscular re-ed: Avoid standing with knees locked  Self Care: Education re- chronic, recurrent musculoskeletal injuries. Suggestions for activity level. Hold tennis. Can continue with weight training. Be sure feet are flat on the surface of the machine plate. Ascend steps with flat feet instead of on toes. Stand without hyperextension of knees.     PATIENT EDUCATION:  Education details: POC; HEP  Person educated: Patient Education method: Programmer, multimedia, Demonstration, Actor cues, Verbal cues, and Handouts Education comprehension: verbalized understanding, returned demonstration, verbal cues required, tactile cues required, and needs further education  HOME EXERCISE  PROGRAM: Access Code: [email or web address] URL: [email or web address] Date: 12/23/2022 Prepared by: Raynelle Fanning  Exercises - Supine Piriformis Stretch with Leg Straight  - 2 x daily - 7 x weekly - 1 sets - 3 reps - 30 sec  hold - Hooklying Hamstring Stretch with Strap  - 2 x daily - 7 x weekly - 1 sets - 3 reps - 30 sec  hold - Supine ITB Stretch with Strap  - 2 x daily - 7 x weekly - 1 sets - 3  reps - 30 sec  hold - Gastroc Stretch on Wall  - 2 x daily - 7 x weekly - 1 sets - 3 reps - 30 sec  hold - Soleus Stretch on Wall  - 2 x daily - 7 x weekly - 1 sets - 3 reps - 30 sec  hold - Supine Quadriceps Stretch with Strap on Table  - 2 x daily - 7 x weekly - 1 sets - 3 reps - 30-60 sec hold - Standing Eccentric Heel Raise  - 1 x daily - 7 x weekly - 3 sets - 10 reps - 3 sec hold  ASSESSMENT:  CLINICAL IMPRESSION: Kristen Meyers returns for first f/u treament. She tolerated TE well with no complaints of pain. She is unable to do a single leg calf raise on the left, so we began eccentric strengthening with 50% WB through the R LE. HEP was progressed with this and a quad stretch as she is very tight. She was very tender in the medial soleus today. She may benefit from DN here next visit.  Eval: Patient is a 52 y.o. female who was seen today for physical therapy evaluation and treatment for L leg pain. She reports initial pain in the L knee and into L calf area at least 7 months ago. She had PT for the knee and dry needling for the calf with some improvement but then experienced pain in the L calf again beginning in June with symptoms increasing in the past month. Patient has no know injury. She does have a history of DVT's in 2003 and 2021. [place] 9/24 negative for DVTs. Patient presents with abnormal gait pattern; muscular tightness to palpation; decreased ROM; weakness in L > R hips; knees and ankles. Patient will benefit from comprehensive PT program to address LE symptoms.   OBJECTIVE IMPAIRMENTS: Abnormal gait, decreased activity tolerance, decreased ROM, decreased strength, hypomobility, impaired flexibility, improper body mechanics, postural dysfunction, obesity, and pain.   ACTIVITY LIMITATIONS: bending, sitting, standing, squatting, stairs, and locomotion level  PARTICIPATION LIMITATIONS: meal prep, cleaning, laundry, driving, shopping, community activity, occupation, and yard work  PERSONAL  FACTORS: Behavior pattern, Fitness, Past/current experiences, and Time since onset of injury/illness/exacerbation are also affecting patient's functional outcome.   REHAB POTENTIAL: Good  CLINICAL DECISION MAKING: Stable/uncomplicated  EVALUATION COMPLEXITY: Low   GOALS: Goals reviewed with patient? Yes  SHORT TERM GOALS: Target date: 01/29/2023   Independent in initial HEP  Baseline: Goal status: INITIAL  2.  Increase AROM L bilat ankle DF by 3-5 degrees  Baseline:  Goal status: INITIAL   LONG TERM GOALS: Target date: 03/12/2023   Decrease pain L calf by 75-100% allowing patient to return to all normal functional activities  Baseline:  Goal status: INITIAL  2.  5/5 strength bilat LE's  Baseline:  Goal status: INITIAL  3.  Ambulation for 20-30 min with pain no greater than 1-2/10 and no limp  Baseline:  Goal status: INITIAL  4.  Patient returns to full  functional and recreational activities including return to tennis  Baseline:  Goal status: INITIAL  5.  Independent in HEP, including aquatic therapy as indicated  Baseline:  Goal status: INITIAL  6.  Improve functional limitation 60  Baseline: 48 Goal status: INITIAL   PLAN:  PT FREQUENCY: 2x/week  PT DURATION: 12 weeks  PLANNED INTERVENTIONS: Therapeutic exercises, Therapeutic activity, Neuromuscular re-education, Balance training, Gait training, Patient/Family education, Self Care, Joint mobilization, Aquatic Therapy, and Dry Needling; modalities; manual work ;   Higher education careers adviser FOR NEXT SESSION: review and progress HEP; manual work, DN,modalities as indicated - need to address LE symptoms   Add hip flexor stretch (sits at desk/computer all day); add hip abduction and extension strengthening; DN and STM/IASTM for calf L/R   Referral from Dr T for eccentric strengthening    Solon Palm, PT  12/23/2022, 9:04 AM

## 2022-12-23 ENCOUNTER — Encounter: Payer: Self-pay | Admitting: Physical Therapy

## 2022-12-23 ENCOUNTER — Ambulatory Visit: Payer: BC Managed Care – PPO | Admitting: Physical Therapy

## 2022-12-23 DIAGNOSIS — M6281 Muscle weakness (generalized): Secondary | ICD-10-CM | POA: Diagnosis not present

## 2022-12-23 DIAGNOSIS — M79662 Pain in left lower leg: Secondary | ICD-10-CM

## 2022-12-23 DIAGNOSIS — R29898 Other symptoms and signs involving the musculoskeletal system: Secondary | ICD-10-CM | POA: Diagnosis not present

## 2022-12-23 DIAGNOSIS — R2689 Other abnormalities of gait and mobility: Secondary | ICD-10-CM

## 2022-12-25 ENCOUNTER — Ambulatory Visit: Payer: BC Managed Care – PPO | Admitting: Rehabilitative and Restorative Service Providers"

## 2022-12-25 ENCOUNTER — Encounter: Payer: Self-pay | Admitting: Rehabilitative and Restorative Service Providers"

## 2022-12-25 DIAGNOSIS — M6281 Muscle weakness (generalized): Secondary | ICD-10-CM

## 2022-12-25 DIAGNOSIS — R29898 Other symptoms and signs involving the musculoskeletal system: Secondary | ICD-10-CM

## 2022-12-25 DIAGNOSIS — R2689 Other abnormalities of gait and mobility: Secondary | ICD-10-CM

## 2022-12-25 DIAGNOSIS — M79662 Pain in left lower leg: Secondary | ICD-10-CM | POA: Diagnosis not present

## 2022-12-25 NOTE — Therapy (Signed)
OUTPATIENT PHYSICAL THERAPY LOWER EXTREMITY TREATMENT   Patient Name: Kristen Meyers MRN: [id number] DOB:Apr 09, 1970, 52 y.o., female Today's Date: 12/25/2022  END OF SESSION:  PT End of Session - 12/25/22 1105     Visit Number 3    Number of Visits 24    Date for PT Re-Evaluation 03/12/23    Authorization Type [employer] copay 40    Authorization Time Period year - 47 remaing for year per TL    Authorization - Visit Number 3    Authorization - Number of Visits 60    PT Start Time 1100    PT Stop Time 1148    PT Time Calculation (min) 48 min    Activity Tolerance Patient tolerated treatment well              Past Medical History:  Diagnosis Date   Acute DVT (deep venous thrombosis) (HCC)    DVT 2012-january, L leg, in the context of post-C section. Again 2021, L leg, dx'd shortly after having covid infection->took eliquis x 41mo.   Cholelithiasis without obstruction 09/19/09 (u/s)   COVID-19 virus infection    approx 11/09/19---got antibody infusion   Diabetes mellitus without complication (HCC) 03/21/2010   Elevated transaminase level 07/19/2012   Essential hypertension 03/21/2010   Qualifier: Diagnosis of   By: Gabriel Rung LPN, Enid Baas liver 09/19/09 (u/s)   Mildly elevated transaminases   GERD 03/21/2010   History of adenomatous polyp of colon 12/2018   2020.  Normal 01/14/22. 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  01/14/22 no polyps. Recall 5 yrs.   Coronary calcium score  10/2022   Score 26, 90th%'tile   LAPAROSCOPIC HYSTERECTOMY  02/2014   Ovaries remain (tubes out): done for DUB   TRANSTHORACIC ECHOCARDIOGRAM     11/2022 EF 55-60%, grd I DD, AV sclerosis w/out stenosis.   TUBAL LIGATION  2011   Dr. Malachy Mood is her GYN.   WISDOM TOOTH EXTRACTION     Patient Active Problem List   Diagnosis Date Noted   Chronic elbow pain, right 12/11/2022   Left leg pain 12/11/2022   Chest pain of uncertain etiology 10/24/2022   Cardiac murmur 10/24/2022   Acute DVT (deep venous thrombosis) (HCC)    Cholelithiasis without obstruction    COVID-19 virus infection    Fatty liver    Obesity, Class II, BMI 35-39.9    Seizures (HCC)    History of laparoscopic-assisted vaginal hysterectomy 02/14/2020   Deep venous thrombosis (HCC) 02/14/2020   Subclinical hypothyroidism 2021   Melanocytic nevi, unspecified 12/30/2018   History of adenomatous polyp of colon 12/2018   Diabetes mellitus without complication (HCC) 06/22/2015   Health maintenance examination 07/20/2013  NAFLD (nonalcoholic fatty liver disease) 16/12/9602   Obesity, morbid (HCC) 10/19/2012   Paresthesia of left arm 09/30/2012   Low back pain 09/30/2012   Left shoulder pain 09/30/2012   Type II or unspecified type diabetes mellitus without mention of complication, uncontrolled 07/19/2012   Elevated transaminase level 07/19/2012   History of herpes zoster 03/2012   Hyperlipidemia 03/21/2010   Essential hypertension 03/21/2010   GERD 03/21/2010    PCP: Dr Earley Favor  REFERRING PROVIDER: Dr Rodney Langton  REFERRING DIAG: L leg pain   THERAPY DIAG:  Pain of left calf  Other symptoms and signs involving the musculoskeletal system  Muscle weakness (generalized)  Other abnormalities of gait and mobility  Rationale for Evaluation and Treatment:  Rehabilitation  ONSET DATE: 08/16/22  SUBJECTIVE:   SUBJECTIVE STATEMENT: Patient reports some continued tightness in the L leg. Working on exercises at home and in the gym but leaving off her tennis for now.    Eval: Patient reports that she has had L leg pain for several months with no known injury. Symptoms have increased in the past month. She has pain and inflammation in the L leg on constant basis now. Pain was intermittent prior to the past month. Had pain in the L knee 3/24 "floating knee cap", then L calf pain. Went to PT with some improvement in the knee with exercise and DN but she stated having pain again 6/24  PERTINENT HISTORY: AODM; HTN; elevated cholesterol; knee pain bilat; R elbow pain for ~ past 6-7 months did PT for tennis elbow with some improvement then symptoms started getting worse; history ot L LE DVT's 2003 and 2021  PAIN:  Are you having pain? Yes: NPRS scale: 1/10 today; worst 6-7/10 Pain location: L leg  Pain description: burning; aching back of leg to achilles area; tight  Aggravating factors: tennis; driving; standing; walking; sitting at desk or upright position Relieving factors: recliner; OTC meds; ice  PRECAUTIONS: Other: history ot L LE DVT's 2003 and 2021 - [place] negative for DVT's 12/11/22   WEIGHT BEARING RESTRICTIONS: No  FALLS:  Has patient fallen in last 6 months? 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  DIAGNOSTIC FINDINGS: 12/11/22: Venous Img Lower L leg; Directed duplex of the left lower extremity negative for DVT   MUSCLE LENGTH: Hamstrings: Right 65 deg; Left 60 deg Thomas test:not tested - tight hip flexors to palpation   POSTURE: rounded shoulders, forward head, flexed trunk , and weight shift right  PALPATION: Tenderness and tightness to palpation L > R hip flexors; posterior hip; gastroc-soleus complex   LOWER EXTREMITY ROM: tight L > R hip  Active ROM Right eval Left eval  Hip flexion    Hip extension    Hip abduction    Hip adduction    Hip internal rotation    Hip external rotation    Knee flexion    Knee extension    Ankle dorsiflexion  9 5  Ankle plantarflexion    Ankle inversion 38 35  Ankle eversion 28 21 pain anterior leg    (Blank rows = not tested)  LOWER EXTREMITY MMT:  MMT Right eval Left eval  Hip flexion 4+ 4+  Hip extension 3+ 3+  Hip abduction 4 4-  Hip adduction    Hip internal rotation    Hip external rotation    Knee flexion 5 5  Knee extension 5 4-  Ankle dorsiflexion 4 4-  Ankle plantarflexion    Ankle inversion 5 5-  Ankle eversion 5 5-   (Blank rows = not tested)  FUNCTIONAL TESTS:  5 times sit to stand: 10.41 sec use of UE's for momentum   GAIT: Distance walked: 40 feet  Assistive device utilized: None Level of assistance: Complete Independence Comments: decreased wt bearing with notable limp with stance to toe off on L     [facility] Adult PT Treatment:                                                DATE: 12/25/22 Therapeutic Exercise: Nustep LE's only L 6 x 5 min Supine (HEP) Piriformis stretch travell 30 sec x 2  Hamstring stretch w/strap 30 sec x 2  ITB stretch w/strap 30 sec x 2  Supine quad stretch with strap off EOB 2x30 sec Prone hip ext x 10 ea, then with 3# AW 3 sec hold 2 x 10 B Sidelying hip ABD 3#AW 2x10 B (HEP)  Standing  Gasoc stretch 30 sec  x 2 foot perpendicular to surface  Soleus stretch 30  sec x 2 foot perpendicular to surface Eccentric heel raises - B 3 sec up, 3 sec hold and 3 sec down  x 10, then with only 50% pressure through the R foot;  full L foot x 10, REST 1.5 min between sets  Manual Therapy: STM to L gastroc/soleus med > lateral Skilled palpation to assess response to manual work and dry needling IASTM L calf  Trigger Point Dry-Needling  Treatment instructions: Expect mild to moderate muscle soreness. Patient verbalized understanding of these instructions and education.  Patient Consent Given: Yes Education handout provided: Yes Muscles treated: L gastroc; soleus  Electrical stimulation performed: Yes Parameters: mAmp current intensity to patient tolerance  Treatment response/outcome: decrease palpable tightness following needling  Modalities;   Moist heat L calf x 10 min    [facility] Adult PT Treatment:                                                DATE: 12/23/22 Therapeutic Exercise: Supine  Piriformis stretch travell 30 sec x 2  Hamstring stretch w/strap 30 sec x 2  ITB stretch w/strap 30 sec x 2  Supine quad stretch with strap off EOB 2x30 sec Prone hip ext x 10 ea, then with 3# AW 3 sec hold 2 x 10 B Sidelying hip ABD 3#AW 2x10 B Standing  Gasoc stretch 30 sec  x 2 foot perpendicular to surface  Soleus stretch 30 sec x 2 foot perpendicular to surface Eccentric heel raises - B 3 sec up, 3 sec hold and 3 sec down  x 10, then with only 50% pressure through the R  foot;  full L foot x 10, REST 1.5 min between sets;  then on stairs x 10  Manual Therapy: STM to L gastroc/soleus med > lateral  [facility] Adult PT Treatment:                                                DATE: 12/18/22 Therapeutic Exercise: Supine  Piriformis stretch travell 30 sec x 2  Hamstring stretch w/strap 30 sec x 2  ITB stretch w/strap 30 sec x 2  Standing  Gasoc stretch 30 sec  x 2 foot perpendicular to surface  Soleus stretch 30 sec x 2 foot perpendicular to surface Manual  Therapy: Add dry needling and manual work L > R calf  Neuromuscular re-ed: Avoid standing with knees locked  Self Care: Education re- chronic, recurrent musculoskeletal injuries. Suggestions for activity level. Hold tennis. Can continue with weight training. Be sure feet are flat on the surface of the machine plate. Ascend steps with flat feet instead of on toes. Stand without hyperextension of knees.     PATIENT EDUCATION:  Education details: POC; HEP  Person educated: Patient Education method: Programmer, multimedia, Demonstration, Actor cues, Verbal cues, and Handouts Education comprehension: verbalized understanding, returned demonstration, verbal cues required, tactile cues required, and needs further education  HOME EXERCISE PROGRAM: Access Code: [email or web address] URL: [email or web address] Date: 12/23/2022 Prepared by: Raynelle Fanning  Exercises - Supine Piriformis Stretch with Leg Straight  - 2 x daily - 7 x weekly - 1 sets - 3 reps - 30 sec  hold - Hooklying Hamstring Stretch with Strap  - 2 x daily - 7 x weekly - 1 sets - 3 reps - 30 sec  hold - Supine ITB Stretch with Strap  - 2 x daily - 7 x weekly - 1 sets - 3 reps - 30 sec  hold - Gastroc Stretch on Wall  - 2 x daily - 7 x weekly - 1 sets - 3 reps - 30 sec  hold - Soleus Stretch on Wall  - 2 x daily - 7 x weekly - 1 sets - 3 reps - 30 sec  hold - Supine Quadriceps Stretch with Strap on Table  - 2 x daily - 7 x weekly - 1 sets - 3 reps - 30-60 sec hold - Standing Eccentric Heel Raise  - 1 x daily - 7 x weekly - 3 sets - 10 reps - 3 sec hold  ASSESSMENT:  CLINICAL IMPRESSION: Kristen Meyers reports no significant change in the L leg. Reviewed eccentric strengthening with 50% WB through the R LE. Continued tightness and tender in the gastroc and lateral soleus. Tolerated DN with stim to gastroc and soleus well.   Eval: Patient is a 52 y.o. female who was seen today for physical therapy evaluation and treatment for L leg pain. She reports  initial pain in the L knee and into L calf area at least 7 months ago. She had PT for the knee and dry needling for the calf with some improvement but then experienced pain in the L calf again beginning in June with symptoms increasing in the past month. Patient has no know injury. She does have a history of DVT's in 2003 and 2021. [place] 9/24 negative for DVTs. Patient presents with abnormal gait pattern; muscular tightness to palpation; decreased ROM; weakness in L > R hips; knees and ankles. Patient  will benefit from comprehensive PT program to address LE symptoms.   OBJECTIVE IMPAIRMENTS: Abnormal gait, decreased activity tolerance, decreased ROM, decreased strength, hypomobility, impaired flexibility, improper body mechanics, postural dysfunction, obesity, and pain.    GOALS: Goals reviewed with patient? Yes  SHORT TERM GOALS: Target date: 01/29/2023   Independent in initial HEP  Baseline: Goal status: INITIAL  2.  Increase AROM L bilat ankle DF by 3-5 degrees  Baseline:  Goal status: INITIAL   LONG TERM GOALS: Target date: 03/12/2023   Decrease pain L calf by 75-100% allowing patient to return to all normal functional activities  Baseline:  Goal status: INITIAL  2.  5/5 strength bilat LE's  Baseline:  Goal status: INITIAL  3.  Ambulation for 20-30 min with pain no greater than 1-2/10 and no limp  Baseline:  Goal status: INITIAL  4.  Patient returns to full functional and recreational activities including return to tennis  Baseline:  Goal status: INITIAL  5.  Independent in HEP, including aquatic therapy as indicated  Baseline:  Goal status: INITIAL  6.  Improve functional limitation 60  Baseline: 48 Goal status: INITIAL   PLAN:  PT FREQUENCY: 2x/week  PT DURATION: 12 weeks  PLANNED INTERVENTIONS: Therapeutic exercises, Therapeutic activity, Neuromuscular re-education, Balance training, Gait training, Patient/Family education, Self Care, Joint mobilization,  Aquatic Therapy, and Dry Needling; modalities; manual work ;   Higher education careers adviser FOR NEXT SESSION: review and progress HEP; manual work, DN,modalities as indicated - need to address LE symptoms   Add hip flexor stretch (sits at desk/computer all day); add hip abduction and extension strengthening; DN and STM/IASTM for calf L/R   Referral from Dr T for eccentric strengthening    Stran Raper P. Leonor Liv PT, MPH 12/25/22 12:45 PM

## 2022-12-29 NOTE — Therapy (Signed)
OUTPATIENT PHYSICAL THERAPY LOWER EXTREMITY TREATMENT   Patient Name: Kristen Meyers MRN: [id number] DOB:06/14/1970, 52 y.o., female Today's Date: 12/30/2022  END OF SESSION:  PT End of Session - 12/30/22 1533     Visit Number 4    Number of Visits 24    Date for PT Re-Evaluation 03/12/23    Authorization Type [employer] copay 40    Authorization Time Period year - 74 remaing for year per TL    Authorization - Visit Number 4    Authorization - Number of Visits 60    PT Start Time 1533    PT Stop Time 1617    PT Time Calculation (min) 44 min    Activity Tolerance Patient tolerated treatment well    Behavior During Therapy WFL for tasks assessed/performed               Past Medical History:  Diagnosis Date   Acute DVT (deep venous thrombosis) (HCC)    DVT 2012-january, L leg, in the context of post-C section. Again 2021, L leg, dx'd shortly after having covid infection->took eliquis x 41mo.   Cholelithiasis without obstruction 09/19/09 (u/s)   COVID-19 virus infection    approx 11/09/19---got antibody infusion   Diabetes mellitus without complication (HCC) 03/21/2010   Elevated transaminase level 07/19/2012   Essential hypertension 03/21/2010   Qualifier: Diagnosis of   By: Gabriel Rung LPN, Enid Baas liver 09/19/09 (u/s)   Mildly elevated transaminases   GERD 03/21/2010   History of adenomatous polyp of colon 12/2018   2020.  Normal 01/14/22. 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  01/14/22 no polyps. Recall 5 yrs.   Coronary calcium score  10/2022   Score 26, 90th%'tile   LAPAROSCOPIC HYSTERECTOMY  02/2014   Ovaries remain (tubes out): done for DUB   TRANSTHORACIC ECHOCARDIOGRAM     11/2022 EF 55-60%, grd I DD, AV sclerosis w/out stenosis.   TUBAL LIGATION  2011   Dr. Malachy Mood is her GYN.   WISDOM TOOTH EXTRACTION     Patient Active Problem List   Diagnosis Date Noted   Chronic elbow pain, right 12/11/2022   Left leg pain 12/11/2022   Chest pain of uncertain etiology 10/24/2022   Cardiac murmur 10/24/2022   Acute DVT (deep venous thrombosis) (HCC)    Cholelithiasis without obstruction    COVID-19 virus infection    Fatty liver    Obesity, Class II, BMI 35-39.9    Seizures (HCC)    History of laparoscopic-assisted vaginal hysterectomy 02/14/2020   Deep venous thrombosis (HCC) 02/14/2020   Subclinical hypothyroidism 2021   Melanocytic nevi, unspecified 12/30/2018   History of adenomatous polyp of colon 12/2018   Diabetes mellitus without  complication (HCC) 06/22/2015   Health maintenance examination 07/20/2013   NAFLD (nonalcoholic fatty liver disease) 57/84/6962   Obesity, morbid (HCC) 10/19/2012   Paresthesia of left arm 09/30/2012   Low back pain 09/30/2012   Left shoulder pain 09/30/2012   Type II or unspecified type diabetes mellitus without mention of complication, uncontrolled 07/19/2012   Elevated transaminase level 07/19/2012   History of herpes zoster 03/2012   Hyperlipidemia 03/21/2010   Essential hypertension 03/21/2010   GERD 03/21/2010    PCP: Dr Earley Favor  REFERRING PROVIDER: Dr Rodney Langton  REFERRING DIAG: L leg pain   THERAPY DIAG:  Pain of left calf  Other symptoms and signs involving the musculoskeletal system  Muscle weakness (generalized)  Other abnormalities of gait and  mobility  Rationale for Evaluation and Treatment: Rehabilitation  ONSET DATE: 08/16/22  SUBJECTIVE:   SUBJECTIVE STATEMENT: Patient reports no pain today. She feels it a little bit at the end of the day but the tightness has really improved.  Eval: Patient reports that she has had L leg pain for several months with no known injury. Symptoms have increased in the past month. She has pain and inflammation in the L leg on constant basis now. Pain was intermittent prior to the past month. Had pain in the L knee 3/24 "floating knee cap", then L calf pain. Went to PT with some improvement in the knee with exercise and DN but she stated having pain again 6/24  PERTINENT HISTORY: AODM; HTN; elevated cholesterol; knee pain bilat; R elbow pain for ~ past 6-7 months did PT for tennis elbow with some improvement then symptoms started getting worse; history ot L LE DVT's 2003 and 2021  PAIN:  Are you having pain? Yes: NPRS scale: 1/10 today; worst 6-7/10 Pain location: L leg  Pain description: burning; aching back of leg to achilles area; tight  Aggravating factors: tennis; driving; standing; walking; sitting at desk or upright position Relieving factors: recliner; OTC meds; ice  PRECAUTIONS: Other: history ot L LE DVT's 2003 and 2021 - [place] negative for DVT's 12/11/22   WEIGHT BEARING RESTRICTIONS: No  FALLS:  Has patient fallen in last 6 months? 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  DIAGNOSTIC FINDINGS: 12/11/22: Venous Img Lower L leg; Directed duplex of the left lower extremity negative for DVT   MUSCLE LENGTH: Hamstrings: Right 65 deg; Left 60 deg Thomas test:not tested - tight hip flexors to palpation   POSTURE: rounded shoulders, forward head, flexed trunk , and weight shift right  PALPATION: Tenderness and tightness to palpation L > R hip flexors; posterior hip; gastroc-soleus complex   LOWER EXTREMITY ROM: tight L > R hip  Active ROM Right eval Left eval  Hip flexion    Hip extension    Hip abduction    Hip adduction    Hip internal rotation    Hip external rotation    Knee flexion  Knee extension    Ankle dorsiflexion 9 5  Ankle plantarflexion    Ankle inversion 38 35  Ankle eversion 28 21 pain anterior leg    (Blank rows = not tested)  LOWER EXTREMITY MMT:  MMT Right eval Left eval  Hip flexion 4+ 4+  Hip extension 3+ 3+  Hip abduction 4 4-  Hip adduction    Hip internal rotation    Hip external rotation    Knee flexion 5 5  Knee extension 5 4-  Ankle dorsiflexion 4 4-  Ankle plantarflexion    Ankle inversion 5 5-  Ankle eversion 5 5-   (Blank rows = not tested)  FUNCTIONAL TESTS:  5 times sit to stand: 10.41 sec use of UE's for momentum   GAIT: Distance walked: 40 feet  Assistive device utilized: None Level of assistance: Complete Independence Comments: decreased wt bearing with notable limp with stance to toe off on L    [facility] Adult PT Treatment:                                                DATE: 12/25/22 Therapeutic Exercise: Elliptical L 1 x 4 min Supine (HEP) Prone hip ext 4# AW 3 sec hold 2 x 10 B Sidelying hip ABD 4#AW 2x10 B (watch for correct form)  Seated arch contractions 5 sec x 10 Standing  Gasoc stretch 30 sec  x 2 foot perpendicular to surface  Soleus stretch 30 sec x 2 foot perpendicular to surface Eccentric heel raises - B 3 sec up, Lift R LE  3 sec hold and 3 sec down  x  10, then with only 50% pressure through the R foot;  full L foot 2 x 10, REST 1.5 min between sets Manual Therapy: STM to L gastroc/soleus med > lateral Skilled palpation to assess response to manual work and dry needling STM L calf  Trigger Point Dry-Needling  Treatment instructions: Expect mild to moderate muscle soreness. Patient verbalized understanding of these instructions and education.  Patient Consent Given: Yes Education handout provided: previously provided Muscles treated: L gastroc; soleus  Electrical stimulation performed: Yes Parameters: mAmp current intensity to patient tolerance  Treatment response/outcome: decrease palpable tightness following needling   [facility] Adult PT Treatment:                                                DATE: 12/25/22 Therapeutic Exercise: Nustep LE's only L 6 x 5 min Supine (HEP) Piriformis stretch travell 30 sec x 2  Hamstring stretch w/strap 30 sec x 2  ITB stretch w/strap 30 sec x 2  Supine quad stretch with strap off EOB 2x30 sec Prone hip ext x 10 ea, then with 3# AW 3 sec hold 2 x 10 B Sidelying hip ABD 3#AW 2x10 B (HEP)  Standing  Gasoc stretch 30 sec  x 2 foot perpendicular to surface  Soleus stretch 30 sec x 2 foot perpendicular to surface Eccentric heel raises - B 3 sec up, 3 sec hold and 3 sec down  x 10, then with only 50% pressure through the R foot;  full L foot x 10, REST 1.5 min between sets  Manual Therapy: STM to L gastroc/soleus med >  lateral Skilled palpation to assess response to manual work and dry needling IASTM L calf  Trigger Point Dry-Needling  Treatment instructions: Expect mild to moderate muscle soreness. Patient verbalized understanding of these instructions and education.  Patient Consent Given: Yes Education handout provided: Yes Muscles treated: L gastroc; soleus  Electrical stimulation performed: Yes Parameters: mAmp current intensity to patient tolerance  Treatment response/outcome: decrease palpable  tightness following needling  Modalities;   Moist heat L calf x 10 min    [facility] Adult PT Treatment:                                                DATE: 12/23/22 Therapeutic Exercise: Supine  Piriformis stretch travell 30 sec x 2  Hamstring stretch w/strap 30 sec x 2  ITB stretch w/strap 30 sec x 2  Supine quad stretch with strap off EOB 2x30 sec Prone hip ext x 10 ea, then with 3# AW 3 sec hold 2 x 10 B Sidelying hip ABD 3#AW 2x10 B Standing  Gasoc stretch 30 sec  x 2 foot perpendicular to surface  Soleus stretch 30 sec x 2 foot perpendicular to surface Eccentric heel raises - B 3 sec up, 3 sec hold and 3 sec down  x 10, then with only 50% pressure through the R foot;  full L foot x 10, REST 1.5 min between sets;  then on stairs x 10  Manual Therapy: STM to L gastroc/soleus med > lateral   PATIENT EDUCATION:  Education details: POC; HEP  Person educated: Patient Education method: Programmer, multimedia, Demonstration, Actor cues, Verbal cues, and Handouts Education comprehension: verbalized understanding, returned demonstration, verbal cues required, tactile cues required, and needs further education  HOME EXERCISE PROGRAM: Access Code: [email or web address] URL: [email or web address] Date: 12/23/2022 Prepared by: Raynelle Fanning  Exercises - Supine Piriformis Stretch with Leg Straight  - 2 x daily - 7 x weekly - 1 sets - 3 reps - 30 sec  hold - Hooklying Hamstring Stretch with Strap  - 2 x daily - 7 x weekly - 1 sets - 3 reps - 30 sec  hold - Supine ITB Stretch with Strap  - 2 x daily - 7 x weekly - 1 sets - 3 reps - 30 sec  hold - Gastroc Stretch on Wall  - 2 x daily - 7 x weekly - 1 sets - 3 reps - 30 sec  hold - Soleus Stretch on Wall  - 2 x daily - 7 x weekly - 1 sets - 3 reps - 30 sec  hold - Supine Quadriceps Stretch with Strap on Table  - 2 x daily - 7 x weekly - 1 sets - 3 reps - 30-60 sec hold - Standing Eccentric Heel Raise  - 1 x daily - 7 x weekly - 3 sets - 10 reps - 3 sec  hold  ASSESSMENT:  CLINICAL IMPRESSION: Kristen Meyers presents today reporting significant decrease in L calf tightness and pain since last visit. She still has some pain at the end of the day, but not much. During eccentric heel raises she tends to roll laterally on the L foot so I had her focus on pushing through the L great toe on descent. This was difficult if she was in full weightbearing on L, but she could do with partial R LE assist. Addressed remaining trigger points in L lateral  gastroc/soleus with DN again with immediate twitch responses and good elongation of tissues. She would benefit from additional DN/MT to medial gastroc/soleus next visit. Kristen Meyers continues to demonstrate potential for improvement and would benefit from continued skilled therapy to address impairments.    Eval: Patient is a 52 y.o. female who was seen today for physical therapy evaluation and treatment for L leg pain. She reports initial pain in the L knee and into L calf area at least 7 months ago. She had PT for the knee and dry needling for the calf with some improvement but then experienced pain in the L calf again beginning in June with symptoms increasing in the past month. Patient has no know injury. She does have a history of DVT's in 2003 and 2021. [place] 9/24 negative for DVTs. Patient presents with abnormal gait pattern; muscular tightness to palpation; decreased ROM; weakness in L > R hips; knees and ankles. Patient will benefit from comprehensive PT program to address LE symptoms.   OBJECTIVE IMPAIRMENTS: Abnormal gait, decreased activity tolerance, decreased ROM, decreased strength, hypomobility, impaired flexibility, improper body mechanics, postural dysfunction, obesity, and pain.    GOALS: Goals reviewed with patient? Yes  SHORT TERM GOALS: Target date: 01/29/2023   Independent in initial HEP  Baseline: Goal status: INITIAL  2.  Increase AROM L bilat ankle DF by 3-5 degrees  Baseline:  Goal status:  INITIAL   LONG TERM GOALS: Target date: 03/12/2023   Decrease pain L calf by 75-100% allowing patient to return to all normal functional activities  Baseline:  Goal status: INITIAL  2.  5/5 strength bilat LE's  Baseline:  Goal status: INITIAL  3.  Ambulation for 20-30 min with pain no greater than 1-2/10 and no limp  Baseline:  Goal status: INITIAL  4.  Patient returns to full functional and recreational activities including return to tennis  Baseline:  Goal status: INITIAL  5.  Independent in HEP, including aquatic therapy as indicated  Baseline:  Goal status: INITIAL  6.  Improve functional limitation 60  Baseline: 48 Goal status: INITIAL   PLAN:  PT FREQUENCY: 2x/week  PT DURATION: 12 weeks  PLANNED INTERVENTIONS: Therapeutic exercises, Therapeutic activity, Neuromuscular re-education, Balance training, Gait training, Patient/Family education, Self Care, Joint mobilization, Aquatic Therapy, and Dry Needling; modalities; manual work ;   Higher education careers adviser FOR NEXT SESSION: review and progress HEP; manual work, DN,modalities as indicated - need to address LE symptoms   Add hip flexor stretch (sits at desk/computer all day); add hip abduction and extension strengthening; DN and STM/IASTM for calf L/R   Referral from Dr T for eccentric strengthening    Solon Palm, PT  12/30/22 4:27 PM

## 2022-12-30 ENCOUNTER — Encounter: Payer: Self-pay | Admitting: Physical Therapy

## 2022-12-30 ENCOUNTER — Ambulatory Visit: Payer: BC Managed Care – PPO | Admitting: Physical Therapy

## 2022-12-30 DIAGNOSIS — M79662 Pain in left lower leg: Secondary | ICD-10-CM | POA: Diagnosis not present

## 2022-12-30 DIAGNOSIS — R29898 Other symptoms and signs involving the musculoskeletal system: Secondary | ICD-10-CM | POA: Diagnosis not present

## 2022-12-30 DIAGNOSIS — M6281 Muscle weakness (generalized): Secondary | ICD-10-CM | POA: Diagnosis not present

## 2022-12-30 DIAGNOSIS — R2689 Other abnormalities of gait and mobility: Secondary | ICD-10-CM

## 2022-12-31 NOTE — Therapy (Signed)
OUTPATIENT PHYSICAL THERAPY LOWER EXTREMITY TREATMENT   Patient Name: AUDRY KAUZLARICH MRN: [id number] DOB:1971-02-16, 52 y.o., female Today's Date: 01/01/2023  END OF SESSION:  PT End of Session - 01/01/23 0801     Visit Number 5    Number of Visits 24    Date for PT Re-Evaluation 03/12/23    Authorization Type [employer] copay 40    Authorization Time Period year - 64 remaing for year per TL    Authorization - Visit Number 5    Authorization - Number of Visits 60    PT Start Time 0801    PT Stop Time 0841    PT Time Calculation (min) 40 min    Activity Tolerance Patient tolerated treatment well    Behavior During Therapy WFL for tasks assessed/performed                Past Medical History:  Diagnosis Date   Acute DVT (deep venous thrombosis) (HCC)    DVT 2012-january, L leg, in the context of post-C section. Again 2021, L leg, dx'd shortly after having covid infection->took eliquis x 42mo.   Cholelithiasis without obstruction 09/19/09 (u/s)   COVID-19 virus infection    approx 11/09/19---got antibody infusion   Diabetes mellitus without complication (HCC) 03/21/2010   Elevated transaminase level 07/19/2012   Essential hypertension 03/21/2010   Qualifier: Diagnosis of   By: Gabriel Rung LPN, Enid Baas liver 09/19/09 (u/s)   Mildly elevated transaminases   GERD 03/21/2010   History of adenomatous polyp of colon 12/2018   2020.  Normal 01/14/22. 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  01/14/22 no polyps. Recall 5 yrs.   Coronary calcium score  10/2022   Score 26, 90th%'tile   LAPAROSCOPIC HYSTERECTOMY  02/2014   Ovaries remain (tubes out): done for DUB   TRANSTHORACIC ECHOCARDIOGRAM     11/2022 EF 55-60%, grd I DD, AV sclerosis w/out stenosis.   TUBAL LIGATION  2011   Dr. Malachy Mood is her GYN.   WISDOM TOOTH EXTRACTION     Patient Active Problem List   Diagnosis Date Noted   Chronic elbow pain, right 12/11/2022   Left leg pain 12/11/2022   Chest pain of uncertain etiology 10/24/2022   Cardiac murmur 10/24/2022   Acute DVT (deep venous thrombosis) (HCC)    Cholelithiasis without obstruction    COVID-19 virus infection    Fatty liver    Obesity, Class II, BMI 35-39.9    Seizures (HCC)    History of laparoscopic-assisted vaginal hysterectomy 02/14/2020   Deep venous thrombosis (HCC) 02/14/2020   Subclinical hypothyroidism 2021   Melanocytic nevi, unspecified 12/30/2018   History of adenomatous polyp of colon 12/2018   Diabetes mellitus  without complication (HCC) 06/22/2015   Health maintenance examination 07/20/2013   NAFLD (nonalcoholic fatty liver disease) 16/12/9602   Obesity, morbid (HCC) 10/19/2012   Paresthesia of left arm 09/30/2012   Low back pain 09/30/2012   Left shoulder pain 09/30/2012   Type II or unspecified type diabetes mellitus without mention of complication, uncontrolled 07/19/2012   Elevated transaminase level 07/19/2012   History of herpes zoster 03/2012   Hyperlipidemia 03/21/2010   Essential hypertension 03/21/2010   GERD 03/21/2010    PCP: Dr Earley Favor  REFERRING PROVIDER: Dr Rodney Langton  REFERRING DIAG: L leg pain   THERAPY DIAG:  Pain of left calf  Other symptoms and signs involving the musculoskeletal system  Muscle weakness (generalized)  Other abnormalities of gait and  mobility  Rationale for Evaluation and Treatment: Rehabilitation  ONSET DATE: 08/16/22  SUBJECTIVE:   SUBJECTIVE STATEMENT: Increased tightness today after her work out Wednesday. Also she didn't do her stretches yesterday. It was fine this morning until the drive here.  Eval: Patient reports that she has had L leg pain for several months with no known injury. Symptoms have increased in the past month. She has pain and inflammation in the L leg on constant basis now. Pain was intermittent prior to the past month. Had pain in the L knee 3/24 "floating knee cap", then L calf pain. Went to PT with some improvement in the knee with exercise and DN but she stated having pain again 6/24  PERTINENT HISTORY: AODM; HTN; elevated cholesterol; knee pain bilat; R elbow pain for ~ past 6-7 months did PT for tennis elbow with some improvement then symptoms started getting worse; history ot L LE DVT's 2003 and 2021  PAIN:  Are you having pain? Yes: NPRS scale: 1/10 today; worst 6-7/10 Pain location: L leg  Pain description: burning; aching back of leg to achilles area; tight  Aggravating factors: tennis; driving; standing; walking; sitting at desk or upright position Relieving factors: recliner; OTC meds; ice  PRECAUTIONS: Other: history ot L LE DVT's 2003 and 2021 - [place] negative for DVT's 12/11/22   WEIGHT BEARING RESTRICTIONS: No  FALLS:  Has patient fallen in last 6 months? 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  DIAGNOSTIC FINDINGS: 12/11/22: Venous Img Lower L leg; Directed duplex of the left lower extremity negative for DVT   MUSCLE LENGTH: Hamstrings: Right 65 deg; Left 60 deg Thomas test:not tested - tight hip flexors to palpation   POSTURE: rounded shoulders, forward head, flexed trunk , and weight shift right  PALPATION: Tenderness and tightness to palpation L > R hip flexors; posterior hip; gastroc-soleus complex   LOWER EXTREMITY ROM: tight L > R hip  Active ROM Right eval Left eval  Hip flexion    Hip extension    Hip abduction    Hip adduction    Hip internal rotation    Hip external rotation    Knee flexion  Knee extension    Ankle dorsiflexion 9 5  Ankle plantarflexion    Ankle inversion 38 35  Ankle eversion 28 21 pain anterior leg    (Blank rows = not tested)  LOWER EXTREMITY MMT:  MMT Right eval Left eval Right 01/01/23 Left 01/01/23  Hip flexion 4+ 4+ 4+ 4+  Hip extension 3+ 3+ 5 5  Hip abduction 4 4- 5 4+  Hip adduction   5 4  Hip internal rotation      Hip external rotation      Knee flexion 5 5    Knee extension 5 4- 5 5  Ankle dorsiflexion 4 4- 5 5  Ankle plantarflexion      Ankle inversion 5 5-    Ankle eversion 5 5-     (Blank rows = not tested)  FUNCTIONAL TESTS:  5 times sit to stand: 10.41 sec use of UE's for momentum   GAIT: Distance walked: 40 feet  Assistive device utilized: None Level of assistance: Complete Independence Comments: decreased wt bearing with notable limp with stance to toe off on L    [facility] Adult PT Treatment:                                                DATE: 01/01/23 Therapeutic Exercise: TM 1.3 mph  x 5 min Gasoc stretch 30 sec  x 1 foot perpendicular to surface  Soleus stretch 30 sec x 1 foot perpendicular to surface 90/90 isometric hip flexor with red band around mid feet alt leg press Standing hip flexor march red Tband x 10 ea (feels burn in lateral L  achilles) S/L hip ADD 2x10 Prone hip ext 4# AW 3 sec hold 2 x 10 B cues for pelvic press/ab engagement Sidelying hip ABD 4#AW 2x10 B (watch for correct form)  Pallof press L side Blue x 10 Attempted Side plank and modified side plank but too much pressure on L UE. Manual Therapy: IASTM to L gastroc/soleus   [facility] Adult PT Treatment:                                                DATE: 12/25/22 Therapeutic Exercise: Elliptical L 1 x 4 min Supine (HEP) Prone hip ext 4# AW 3 sec hold 2 x 10 B Sidelying hip ABD 4#AW 2x10 B (watch for correct form)  Seated arch contractions 5 sec x 10 Standing  Gasoc stretch 30 sec  x 2 foot perpendicular to surface  Soleus stretch 30 sec x 2 foot perpendicular to surface Eccentric heel raises - B 3 sec up, Lift R LE  3 sec hold and 3 sec down  x 10, then with only 50% pressure through the R foot;  full L foot 2 x 10, REST 1.5 min between sets Manual Therapy: STM to L gastroc/soleus med > lateral Skilled palpation to assess response to manual work and dry needling STM L calf  Trigger Point Dry-Needling  Treatment instructions: Expect mild to moderate muscle soreness. Patient verbalized understanding of these instructions and education.  Patient Consent Given: Yes Education handout provided: previously provided Muscles treated: L gastroc; soleus  Electrical stimulation performed: Yes Parameters: mAmp current intensity to patient tolerance  Treatment response/outcome:  decrease palpable tightness following needling   [facility] Adult PT Treatment:                                                DATE: 12/25/22 Therapeutic Exercise: Nustep LE's only L 6 x 5 min Supine (HEP) Piriformis stretch travell 30 sec x 2  Hamstring stretch w/strap 30 sec x 2  ITB stretch w/strap 30 sec x 2  Supine quad stretch with strap off EOB 2x30 sec Prone hip ext x 10 ea, then with 3# AW 3 sec hold 2 x 10 B Sidelying hip ABD 3#AW 2x10 B (HEP)  Standing  Gasoc stretch 30 sec  x  2 foot perpendicular to surface  Soleus stretch 30 sec x 2 foot perpendicular to surface Eccentric heel raises - B 3 sec up, 3 sec hold and 3 sec down  x 10, then with only 50% pressure through the R foot;  full L foot x 10, REST 1.5 min between sets  Manual Therapy: STM to L gastroc/soleus med > lateral Skilled palpation to assess response to manual work and dry needling IASTM L calf  Trigger Point Dry-Needling  Treatment instructions: Expect mild to moderate muscle soreness. Patient verbalized understanding of these instructions and education.  Patient Consent Given: Yes Education handout provided: Yes Muscles treated: L gastroc; soleus  Electrical stimulation performed: Yes Parameters: mAmp current intensity to patient tolerance  Treatment response/outcome: decrease palpable tightness following needling  Modalities;   Moist heat L calf x 10 min    [facility] Adult PT Treatment:                                                DATE: 12/23/22 Therapeutic Exercise: Supine  Piriformis stretch travell 30 sec x 2  Hamstring stretch w/strap 30 sec x 2  ITB stretch w/strap 30 sec x 2  Supine quad stretch with strap off EOB 2x30 sec Prone hip ext x 10 ea, then with 3# AW 3 sec hold 2 x 10 B Sidelying hip ABD 3#AW 2x10 B Standing  Gasoc stretch 30 sec  x 2 foot perpendicular to surface  Soleus stretch 30 sec x 2 foot perpendicular to surface Eccentric heel raises - B 3 sec up, 3 sec hold and 3 sec down  x 10, then with only 50% pressure through the R foot;  full L foot x 10, REST 1.5 min between sets;  then on stairs x 10  Manual Therapy: STM to L gastroc/soleus med > lateral   PATIENT EDUCATION:  Education details: POC; HEP  Person educated: Patient Education method: Programmer, multimedia, Demonstration, Actor cues, Verbal cues, and Handouts Education comprehension: verbalized understanding, returned demonstration, verbal cues required, tactile cues required, and needs further education  HOME  EXERCISE PROGRAM: Access Code: [email or web address] URL: [email or web address] Date: 12/23/2022 Prepared by: Raynelle Fanning  Exercises - Supine Piriformis Stretch with Leg Straight  - 2 x daily - 7 x weekly - 1 sets - 3 reps - 30 sec  hold - Hooklying Hamstring Stretch with Strap  - 2 x daily - 7 x weekly - 1 sets - 3 reps - 30 sec  hold - Supine ITB Stretch with Strap  - 2 x daily - 7 x  weekly - 1 sets - 3 reps - 30 sec  hold - Gastroc Stretch on Wall  - 2 x daily - 7 x weekly - 1 sets - 3 reps - 30 sec  hold - Soleus Stretch on Wall  - 2 x daily - 7 x weekly - 1 sets - 3 reps - 30 sec  hold - Supine Quadriceps Stretch with Strap on Table  - 2 x daily - 7 x weekly - 1 sets - 3 reps - 30-60 sec hold - Standing Eccentric Heel Raise  - 1 x daily - 7 x weekly - 3 sets - 10 reps - 3 sec hold  ASSESSMENT:  CLINICAL IMPRESSION:  Kayln reports increased tightness in heel after sitting in car and with squats and lifting when working out. She also demonstrates left lumbar weakness with prone hip extensions on the R LE. Unsure if back is contributing to L LE pain. She is still very tight and tender with STM to L soleus. She deferred DN today, but would benefit next visit. Also would benefit from foam rolling education to her lower extremities. Significant increase in LE strength compared to eval.  Eval: Patient is a 52 y.o. female who was seen today for physical therapy evaluation and treatment for L leg pain. She reports initial pain in the L knee and into L calf area at least 7 months ago. She had PT for the knee and dry needling for the calf with some improvement but then experienced pain in the L calf again beginning in June with symptoms increasing in the past month. Patient has no know injury. She does have a history of DVT's in 2003 and 2021. [place] 9/24 negative for DVTs. Patient presents with abnormal gait pattern; muscular tightness to palpation; decreased ROM; weakness in L > R hips; knees and ankles.  Patient will benefit from comprehensive PT program to address LE symptoms.   OBJECTIVE IMPAIRMENTS: Abnormal gait, decreased activity tolerance, decreased ROM, decreased strength, hypomobility, impaired flexibility, improper body mechanics, postural dysfunction, obesity, and pain.    GOALS: Goals reviewed with patient? Yes  SHORT TERM GOALS: Target date: 01/29/2023   Independent in initial HEP  Baseline: Goal status: INITIAL  2.  Increase AROM L bilat ankle DF by 3-5 degrees  Baseline:  Goal status: INITIAL   LONG TERM GOALS: Target date: 03/12/2023   Decrease pain L calf by 75-100% allowing patient to return to all normal functional activities  Baseline:  Goal status: INITIAL  2.  5/5 strength bilat LE's  Baseline:  Goal status: INITIAL  3.  Ambulation for 20-30 min with pain no greater than 1-2/10 and no limp  Baseline:  Goal status: INITIAL  4.  Patient returns to full functional and recreational activities including return to tennis  Baseline:  Goal status: INITIAL  5.  Independent in HEP, including aquatic therapy as indicated  Baseline:  Goal status: INITIAL  6.  Improve functional limitation 60  Baseline: 48 Goal status: INITIAL   PLAN:  PT FREQUENCY: 2x/week  PT DURATION: 12 weeks  PLANNED INTERVENTIONS: Therapeutic exercises, Therapeutic activity, Neuromuscular re-education, Balance training, Gait training, Patient/Family education, Self Care, Joint mobilization, Aquatic Therapy, and Dry Needling; modalities; manual work ;   Higher education careers adviser FOR NEXT SESSION: review and progress HEP; manual work, DN,modalities as indicated - need to address LE symptoms   Add hip flexor stretch (sits at desk/computer all day); add hip abduction and extension strengthening; DN and STM/IASTM for calf L/R  Referral from Dr T for eccentric strengthening    Solon Palm, PT  01/01/23 8:46 AM

## 2023-01-01 ENCOUNTER — Ambulatory Visit: Payer: BC Managed Care – PPO | Admitting: Physical Therapy

## 2023-01-01 ENCOUNTER — Encounter: Payer: Self-pay | Admitting: Physical Therapy

## 2023-01-01 DIAGNOSIS — M6281 Muscle weakness (generalized): Secondary | ICD-10-CM

## 2023-01-01 DIAGNOSIS — M79662 Pain in left lower leg: Secondary | ICD-10-CM

## 2023-01-01 DIAGNOSIS — R2689 Other abnormalities of gait and mobility: Secondary | ICD-10-CM | POA: Diagnosis not present

## 2023-01-01 DIAGNOSIS — R29898 Other symptoms and signs involving the musculoskeletal system: Secondary | ICD-10-CM | POA: Diagnosis not present

## 2023-01-06 ENCOUNTER — Ambulatory Visit: Payer: BC Managed Care – PPO | Admitting: Rehabilitative and Restorative Service Providers"

## 2023-01-06 ENCOUNTER — Encounter: Payer: Self-pay | Admitting: Sports Medicine

## 2023-01-06 ENCOUNTER — Encounter: Payer: Self-pay | Admitting: Rehabilitative and Restorative Service Providers"

## 2023-01-06 DIAGNOSIS — R29898 Other symptoms and signs involving the musculoskeletal system: Secondary | ICD-10-CM | POA: Diagnosis not present

## 2023-01-06 DIAGNOSIS — R2689 Other abnormalities of gait and mobility: Secondary | ICD-10-CM | POA: Diagnosis not present

## 2023-01-06 DIAGNOSIS — M79662 Pain in left lower leg: Secondary | ICD-10-CM

## 2023-01-06 DIAGNOSIS — M6281 Muscle weakness (generalized): Secondary | ICD-10-CM | POA: Diagnosis not present

## 2023-01-06 NOTE — Telephone Encounter (Signed)
Patient had the studies on 12/21/22. Results are still pending.

## 2023-01-06 NOTE — Therapy (Signed)
OUTPATIENT PHYSICAL THERAPY LOWER EXTREMITY TREATMENT   Patient Name: Kristen Meyers MRN: [id number] DOB:1971-02-01, 52 y.o., female Today's Date: 01/06/2023  END OF SESSION:  PT End of Session - 01/06/23 0805     Visit Number 6    Number of Visits 24    Date for PT Re-Evaluation 03/12/23    Authorization Type [employer] copay 40    Authorization Time Period year - 65 remaing for year per TL    Authorization - Visit Number 6    Authorization - Number of Visits 60    PT Start Time 0800    PT Stop Time 0845    PT Time Calculation (min) 45 min    Activity Tolerance Patient tolerated treatment well                Past Medical History:  Diagnosis Date   Acute DVT (deep venous thrombosis) (HCC)    DVT 2012-january, L leg, in the context of post-C section. Again 2021, L leg, dx'd shortly after having covid infection->took eliquis x 49mo.   Cholelithiasis without obstruction 09/19/09 (u/s)   COVID-19 virus infection    approx 11/09/19---got antibody infusion   Diabetes mellitus without complication (HCC) 03/21/2010   Elevated transaminase level 07/19/2012   Essential hypertension 03/21/2010   Qualifier: Diagnosis of   By: Gabriel Rung LPN, Enid Baas liver 09/19/09 (u/s)   Mildly elevated transaminases   GERD 03/21/2010   History of adenomatous polyp of colon 12/2018   2020.  Normal 01/14/22. 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  01/14/22 no polyps. Recall 5 yrs.   Coronary calcium score  10/2022   Score 26, 90th%'tile   LAPAROSCOPIC HYSTERECTOMY  02/2014   Ovaries remain (tubes out): done for DUB   TRANSTHORACIC ECHOCARDIOGRAM     11/2022 EF 55-60%, grd I DD, AV sclerosis w/out stenosis.   TUBAL LIGATION  2011   Dr. Malachy Mood is her GYN.   WISDOM TOOTH EXTRACTION     Patient Active Problem List   Diagnosis Date Noted   Chronic elbow pain, right 12/11/2022   Left leg pain 12/11/2022   Chest pain of uncertain etiology 10/24/2022   Cardiac murmur 10/24/2022   Acute DVT (deep venous thrombosis) (HCC)    Cholelithiasis without obstruction    COVID-19 virus infection    Fatty liver    Obesity, Class II, BMI 35-39.9    Seizures (HCC)    History of laparoscopic-assisted vaginal hysterectomy 02/14/2020   Deep venous thrombosis (HCC) 02/14/2020   Subclinical hypothyroidism 2021   Melanocytic nevi, unspecified 12/30/2018   History of adenomatous polyp of colon 12/2018   Diabetes mellitus without complication (HCC) 06/22/2015   Health maintenance examination 07/20/2013  NAFLD (nonalcoholic fatty liver disease) 16/12/9602   Obesity, morbid (HCC) 10/19/2012   Paresthesia of left arm 09/30/2012   Low back pain 09/30/2012   Left shoulder pain 09/30/2012   Type II or unspecified type diabetes mellitus without mention of complication, uncontrolled 07/19/2012   Elevated transaminase level 07/19/2012   History of herpes zoster 03/2012   Hyperlipidemia 03/21/2010   Essential hypertension 03/21/2010   GERD 03/21/2010    PCP: Dr Earley Favor  REFERRING PROVIDER: Dr Rodney Langton  REFERRING DIAG: L leg pain   THERAPY DIAG:  Pain of left calf  Other symptoms and signs involving the musculoskeletal system  Muscle weakness (generalized)  Other abnormalities of gait and mobility  Rationale for Evaluation and Treatment:  Rehabilitation  ONSET DATE: 08/16/22  SUBJECTIVE:   SUBJECTIVE STATEMENT: Improving. Seems better early in the week and tightens up toward the end of the week. Maybe related to workouts she is doing Tuesday and Thursday. Hopes to return to tennis next week.   Eval: Patient reports that she has had L leg pain for several months with no known injury. Symptoms have increased in the past month. She has pain and inflammation in the L leg on constant basis now. Pain was intermittent prior to the past month. Had pain in the L knee 3/24 "floating knee cap", then L calf pain. Went to PT with some improvement in the knee with exercise and DN but she stated having pain again 6/24  PERTINENT HISTORY: AODM; HTN; elevated cholesterol; knee pain bilat; R elbow pain for ~ past 6-7 months did PT for tennis elbow with some improvement then symptoms started getting worse; history ot L LE DVT's 2003 and 2021  PAIN:  Are you having pain? Yes: NPRS scale: 0/10 today; worst 6-7/10; no pain; persistent tightness  Pain location: L leg  Pain description: burning; aching back of leg to achilles area; tight  Aggravating factors: tennis; driving; standing; walking; sitting at desk or upright position Relieving factors: recliner; OTC meds; ice  PRECAUTIONS: Other: history ot L LE DVT's 2003 and 2021 - [place] negative for DVT's 12/11/22   WEIGHT BEARING RESTRICTIONS: No  FALLS:  Has patient fallen in last 6 months? 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  DIAGNOSTIC FINDINGS: 12/11/22: Venous Img Lower L leg; Directed duplex of the left lower extremity negative for DVT   MUSCLE LENGTH: Hamstrings: Right 65 deg; Left 60 deg Thomas test:not tested - tight hip flexors to palpation   POSTURE: rounded shoulders, forward head, flexed trunk , and weight shift right  PALPATION: Tenderness and tightness to palpation L > R hip flexors; posterior hip; gastroc-soleus complex   LOWER EXTREMITY ROM: tight L > R hip  Active ROM Right eval Left eval  Hip flexion    Hip extension    Hip abduction    Hip adduction    Hip internal rotation    Hip external rotation  Knee flexion    Knee extension    Ankle dorsiflexion 9 5  Ankle plantarflexion    Ankle inversion 38 35  Ankle eversion 28 21 pain anterior leg    (Blank rows = not tested)  LOWER EXTREMITY MMT:  MMT Right eval Left eval Right 01/01/23 Left 01/01/23  Hip flexion 4+ 4+ 4+ 4+  Hip extension 3+ 3+ 5 5  Hip abduction 4 4- 5 4+  Hip adduction   5 4  Hip internal rotation      Hip external rotation      Knee flexion 5 5    Knee extension 5 4- 5 5  Ankle dorsiflexion 4 4- 5 5  Ankle plantarflexion      Ankle inversion 5 5-    Ankle eversion 5 5-     (Blank rows = not tested)  FUNCTIONAL TESTS:  5 times sit to stand: 10.41 sec use of UE's for momentum   GAIT: Distance walked: 40 feet  Assistive device utilized: None Level of assistance: Complete Independence Comments: decreased wt bearing with notable limp with stance to toe off on L     [facility] Adult PT Treatment:                                                DATE: 01/06/23 Therapeutic Exercise: TM 2.2 mph  x 5 min Gasoc stretch 30 sec  x 1 foot perpendicular to surface before and after treadmill  Soleus stretch 30 sec x 1 foot perpendicular to surface before and after treadmill Incline board ~ 45-60 sec knees straight, knees bent  Heel raise  in sitting to isolate soleus x 8; repeated with 10# KB x 10   Manual Therapy: IASTM to L gastroc/soleus  Skilled palpation to assess response to manual work and dry needling  Trigger Point Dry-Needling  Treatment instructions: Expect mild to moderate muscle soreness.  Patient Consent Given: Yes Education handout provided: Previously provided Muscles treated: L gastroc/soleus  Electrical stimulation performed: Yes Parameters:  mAmp current intensity to patient tolerance Treatment response/outcome: decreased palpable tightness     [facility] Adult PT Treatment:                                                DATE: 01/01/23 Therapeutic Exercise: TM 1.3 mph  x 5 min Gasoc stretch 30 sec  x 1 foot perpendicular to surface  Soleus stretch 30 sec x 1 foot perpendicular to surface 90/90 isometric hip flexor with red band around mid feet alt leg press Standing hip flexor march red Tband x 10 ea (feels burn in lateral L achilles) S/L hip ADD 2x10 Prone hip ext 4# AW 3 sec hold 2 x 10 B cues for pelvic press/ab engagement Sidelying hip ABD 4#AW 2x10 B (watch for correct form)  Pallof press L side Blue x 10 Attempted Side plank and modified side plank but too much pressure on L UE. Manual Therapy: IASTM to L gastroc/soleus   [facility] Adult PT Treatment:  DATE: 12/25/22 Therapeutic Exercise: Elliptical L 1 x 4 min Supine (HEP) Prone hip ext 4# AW 3 sec hold 2 x 10 B Sidelying hip ABD 4#AW 2x10 B (watch for correct form)  Seated arch contractions 5 sec x 10 Standing  Gasoc stretch 30 sec  x 2 foot perpendicular to surface  Soleus stretch 30 sec x 2 foot perpendicular to surface Eccentric heel raises - B 3 sec up, Lift R LE  3 sec hold and 3 sec down  x 10, then with only 50% pressure through the R foot;  full L foot 2 x 10, REST 1.5 min between sets Manual Therapy: STM to L gastroc/soleus med > lateral Skilled palpation to assess response to manual  work and dry needling STM L calf  Trigger Point Dry-Needling  Treatment instructions: Expect mild to moderate muscle soreness. Patient verbalized understanding of these instructions and education.  Patient Consent Given: Yes Education handout provided: previously provided Muscles treated: L gastroc; soleus  Electrical stimulation performed: Yes Parameters: mAmp current intensity to patient tolerance  Treatment response/outcome: decrease palpable tightness following needling   [facility] Adult PT Treatment:                                                DATE: 12/25/22 Therapeutic Exercise: Nustep LE's only L 6 x 5 min Supine (HEP) Piriformis stretch travell 30 sec x 2  Hamstring stretch w/strap 30 sec x 2  ITB stretch w/strap 30 sec x 2  Supine quad stretch with strap off EOB 2x30 sec Prone hip ext x 10 ea, then with 3# AW 3 sec hold 2 x 10 B Sidelying hip ABD 3#AW 2x10 B (HEP)  Standing  Gasoc stretch 30 sec  x 2 foot perpendicular to surface  Soleus stretch 30 sec x 2 foot perpendicular to surface Eccentric heel raises - B 3 sec up, 3 sec hold and 3 sec down  x 10, then with only 50% pressure through the R foot;  full L foot x 10, REST 1.5 min between sets  Manual Therapy: STM to L gastroc/soleus med > lateral Skilled palpation to assess response to manual work and dry needling IASTM L calf  Trigger Point Dry-Needling  Treatment instructions: Expect mild to moderate muscle soreness. Patient verbalized understanding of these instructions and education.  Patient Consent Given: Yes Education handout provided: Yes Muscles treated: L gastroc; soleus  Electrical stimulation performed: Yes Parameters: mAmp current intensity to patient tolerance  Treatment response/outcome: decrease palpable tightness following needling  Modalities;   Moist heat L calf x 10 min    [facility] Adult PT Treatment:                                                DATE: 12/23/22 Therapeutic Exercise: Supine   Piriformis stretch travell 30 sec x 2  Hamstring stretch w/strap 30 sec x 2  ITB stretch w/strap 30 sec x 2  Supine quad stretch with strap off EOB 2x30 sec Prone hip ext x 10 ea, then with 3# AW 3 sec hold 2 x 10 B Sidelying hip ABD 3#AW 2x10 B Standing  Gasoc stretch 30 sec  x 2 foot perpendicular to surface  Soleus stretch 30  sec x 2 foot perpendicular to surface Eccentric heel raises - B 3 sec up, 3 sec hold and 3 sec down  x 10, then with only 50% pressure through the R foot;  full L foot x 10, REST 1.5 min between sets;  then on stairs x 10  Manual Therapy: STM to L gastroc/soleus med > lateral   PATIENT EDUCATION:  Education details: POC; HEP  Person educated: Patient Education method: Programmer, multimedia, Demonstration, Actor cues, Verbal cues, and Handouts Education comprehension: verbalized understanding, returned demonstration, verbal cues required, tactile cues required, and needs further education  HOME EXERCISE PROGRAM: Access Code: [email or web address] URL: [email or web address] Date: 12/23/2022 Prepared by: Raynelle Fanning  Exercises - Supine Piriformis Stretch with Leg Straight  - 2 x daily - 7 x weekly - 1 sets - 3 reps - 30 sec  hold - Hooklying Hamstring Stretch with Strap  - 2 x daily - 7 x weekly - 1 sets - 3 reps - 30 sec  hold - Supine ITB Stretch with Strap  - 2 x daily - 7 x weekly - 1 sets - 3 reps - 30 sec  hold - Gastroc Stretch on Wall  - 2 x daily - 7 x weekly - 1 sets - 3 reps - 30 sec  hold - Soleus Stretch on Wall  - 2 x daily - 7 x weekly - 1 sets - 3 reps - 30 sec  hold - Supine Quadriceps Stretch with Strap on Table  - 2 x daily - 7 x weekly - 1 sets - 3 reps - 30-60 sec hold - Standing Eccentric Heel Raise  - 1 x daily - 7 x weekly - 3 sets - 10 reps - 3 sec hold  ASSESSMENT:  CLINICAL IMPRESSION:  Kristen reports improvement in symptoms but she continues to have tightness in  L calf more toward the end of the week. Added stretch using incline board  and isolation of soleus with heel lift in sitting. Continued with DN and manual work through L calf. Gradual progress toward stated goals of therapy.    She also demonstrates left lumbar weakness with prone hip extensions on the R LE. Unsure if back is contributing to L LE pain. She is still very tight and tender with STM to L soleus. She would benefit from foam rolling education to her lower extremities. Significant increase in LE strength compared to eval.  Eval: Patient is a 52 y.o. female who was seen today for physical therapy evaluation and treatment for L leg pain. She reports initial pain in the L knee and into L calf area at least 7 months ago. She had PT for the knee and dry needling for the calf with some improvement but then experienced pain in the L calf again beginning in June with symptoms increasing in the past month. Patient has no know injury. She does have a history of DVT's in 2003 and 2021. [place] 9/24 negative for DVTs. Patient presents with abnormal gait pattern; muscular tightness to palpation; decreased ROM; weakness in L > R hips; knees and ankles. Patient will benefit from comprehensive PT program to address LE symptoms.   OBJECTIVE IMPAIRMENTS: Abnormal gait, decreased activity tolerance, decreased ROM, decreased strength, hypomobility, impaired flexibility, improper body mechanics, postural dysfunction, obesity, and pain.    GOALS: Goals reviewed with patient? Yes  SHORT TERM GOALS: Target date: 01/29/2023   Independent in initial HEP  Baseline: Goal status: INITIAL  2.  Increase AROM L bilat ankle DF by  3-5 degrees  Baseline:  Goal status: INITIAL   LONG TERM GOALS: Target date: 03/12/2023   Decrease pain L calf by 75-100% allowing patient to return to all normal functional activities  Baseline:  Goal status: INITIAL  2.  5/5 strength bilat LE's  Baseline:  Goal status: INITIAL  3.  Ambulation for 20-30 min with pain no greater than 1-2/10 and no limp   Baseline:  Goal status: INITIAL  4.  Patient returns to full functional and recreational activities including return to tennis  Baseline:  Goal status: INITIAL  5.  Independent in HEP, including aquatic therapy as indicated  Baseline:  Goal status: INITIAL  6.  Improve functional limitation 60  Baseline: 48 Goal status: INITIAL   PLAN:  PT FREQUENCY: 2x/week  PT DURATION: 12 weeks  PLANNED INTERVENTIONS: Therapeutic exercises, Therapeutic activity, Neuromuscular re-education, Balance training, Gait training, Patient/Family education, Self Care, Joint mobilization, Aquatic Therapy, and Dry Needling; modalities; manual work ;   Higher education careers adviser FOR NEXT SESSION: review and progress HEP; manual work, DN,modalities as indicated - need to address LE symptoms   Add hip flexor stretch (sits at desk/computer all day); add hip abduction and extension strengthening; DN and STM/IASTM for calf L/R   Referral from Dr T for eccentric strengthening    Marquasia Meyers P. Leonor Liv PT, MPH 01/06/23 8:07 AM

## 2023-01-08 ENCOUNTER — Encounter: Payer: Self-pay | Admitting: Rehabilitative and Restorative Service Providers"

## 2023-01-08 ENCOUNTER — Ambulatory Visit: Payer: BC Managed Care – PPO | Admitting: Rehabilitative and Restorative Service Providers"

## 2023-01-08 DIAGNOSIS — M6281 Muscle weakness (generalized): Secondary | ICD-10-CM | POA: Diagnosis not present

## 2023-01-08 DIAGNOSIS — M79662 Pain in left lower leg: Secondary | ICD-10-CM

## 2023-01-08 DIAGNOSIS — R2689 Other abnormalities of gait and mobility: Secondary | ICD-10-CM | POA: Diagnosis not present

## 2023-01-08 DIAGNOSIS — R29898 Other symptoms and signs involving the musculoskeletal system: Secondary | ICD-10-CM | POA: Diagnosis not present

## 2023-01-08 NOTE — Therapy (Signed)
OUTPATIENT PHYSICAL THERAPY LOWER EXTREMITY TREATMENT   Patient Name: GRACIANNA SHYTLE MRN: [id number] DOB:Sep 24, 1970, 52 y.o., female Today's Date: 01/08/2023  END OF SESSION:  PT End of Session - 01/08/23 1155     Visit Number 7    Number of Visits 24    Date for PT Re-Evaluation 03/12/23    Authorization Type [employer] copay 40    Authorization Time Period year - 106 remaing for year per TL    Authorization - Visit Number 7    Authorization - Number of Visits 60    PT Start Time 1145    PT Stop Time 1230    PT Time Calculation (min) 45 min    Activity Tolerance Patient tolerated treatment well                Past Medical History:  Diagnosis Date   Acute DVT (deep venous thrombosis) (HCC)    DVT 2012-january, L leg, in the context of post-C section. Again 2021, L leg, dx'd shortly after having covid infection->took eliquis x 46mo.   Cholelithiasis without obstruction 09/19/09 (u/s)   COVID-19 virus infection    approx 11/09/19---got antibody infusion   Diabetes mellitus without complication (HCC) 03/21/2010   Elevated transaminase level 07/19/2012   Essential hypertension 03/21/2010   Qualifier: Diagnosis of   By: Gabriel Rung LPN, Enid Baas liver 09/19/09 (u/s)   Mildly elevated transaminases   GERD 03/21/2010   History of adenomatous polyp of colon 12/2018   2020.  Normal 01/14/22. 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  01/14/22 no polyps. Recall 5 yrs.   Coronary calcium score  10/2022   Score 26, 90th%'tile   LAPAROSCOPIC HYSTERECTOMY  02/2014   Ovaries remain (tubes out): done for DUB   TRANSTHORACIC ECHOCARDIOGRAM     11/2022 EF 55-60%, grd I DD, AV sclerosis w/out stenosis.   TUBAL LIGATION  2011   Dr. Malachy Mood is her GYN.   WISDOM TOOTH EXTRACTION     Patient Active Problem List   Diagnosis Date Noted   Chronic elbow pain, right 12/11/2022   Left leg pain 12/11/2022   Chest pain of uncertain etiology 10/24/2022   Cardiac murmur 10/24/2022   Acute DVT (deep venous thrombosis) (HCC)    Cholelithiasis without obstruction    COVID-19 virus infection    Fatty liver    Obesity, Class II, BMI 35-39.9    Seizures (HCC)    History of laparoscopic-assisted vaginal hysterectomy 02/14/2020   Deep venous thrombosis (HCC) 02/14/2020   Subclinical hypothyroidism 2021   Melanocytic nevi, unspecified 12/30/2018   History of adenomatous polyp of colon 12/2018   Diabetes mellitus without complication (HCC) 06/22/2015   Health maintenance examination 07/20/2013  NAFLD (nonalcoholic fatty liver disease) 65/78/4696   Obesity, morbid (HCC) 10/19/2012   Paresthesia of left arm 09/30/2012   Low back pain 09/30/2012   Left shoulder pain 09/30/2012   Type II or unspecified type diabetes mellitus without mention of complication, uncontrolled 07/19/2012   Elevated transaminase level 07/19/2012   History of herpes zoster 03/2012   Hyperlipidemia 03/21/2010   Essential hypertension 03/21/2010   GERD 03/21/2010    PCP: Dr Earley Favor  REFERRING PROVIDER: Dr Rodney Langton  REFERRING DIAG: L leg pain   THERAPY DIAG:  Pain of left calf  Other symptoms and signs involving the musculoskeletal system  Muscle weakness (generalized)  Other abnormalities of gait and mobility  Rationale for Evaluation and Treatment:  Rehabilitation  ONSET DATE: 08/16/22  SUBJECTIVE:   SUBJECTIVE STATEMENT: Had a good bit of soreness following the last dry needling but then felt so much better the next day. Very pleased with her progress. No pain today. Has some soreness or tenderness at the end of the day but doing much better. Improving. Working with a Education administrator.   Eval: Patient reports that she has had L leg pain for several months with no known injury. Symptoms have increased in the past month. She has pain and inflammation in the L leg on constant basis now. Pain was intermittent prior to the past month. Had pain in the L knee 3/24 "floating knee cap", then L calf pain. Went to PT with some improvement in the knee with exercise and DN but she stated having pain again 6/24  PERTINENT HISTORY: AODM; HTN; elevated cholesterol; knee pain bilat; R elbow pain for ~ past 6-7 months did PT for tennis elbow with some improvement then symptoms started getting worse; history ot L LE DVT's 2003 and 2021  PAIN:  Are you having pain? Yes: NPRS scale: 0/10 today; worst 4/10; persistent tightness  Pain location: L leg  Pain description: burning; aching back of leg to achilles area; tight  Aggravating factors: tennis; driving; standing; walking; sitting at desk or upright position Relieving factors: recliner; OTC meds; ice  PRECAUTIONS: Other: history ot L LE DVT's 2003 and 2021 - [place] negative for DVT's 12/11/22   WEIGHT BEARING RESTRICTIONS: No  FALLS:  Has patient fallen in last 6 months? 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  DIAGNOSTIC FINDINGS: 12/11/22: Venous Img Lower L leg; Directed duplex of the left lower extremity negative for DVT   MUSCLE LENGTH: Hamstrings: Right 65 deg; Left 60 deg Thomas test:not tested - tight hip flexors to palpation   POSTURE: rounded shoulders, forward head, flexed trunk , and weight shift right  PALPATION: Tenderness and tightness to palpation L > R hip flexors; posterior hip; gastroc-soleus complex   LOWER EXTREMITY ROM: tight L > R hip  Active ROM Right eval Left eval  Hip flexion    Hip extension    Hip abduction    Hip  adduction    Hip internal rotation    Hip external rotation    Knee flexion    Knee extension    Ankle dorsiflexion 9 5  Ankle plantarflexion    Ankle inversion 38 35  Ankle eversion 28 21 pain anterior leg    (Blank rows = not tested)  LOWER EXTREMITY MMT:  MMT Right eval Left eval Right 01/01/23 Left 01/01/23  Hip flexion 4+ 4+ 4+ 4+  Hip extension 3+ 3+ 5 5  Hip abduction 4 4- 5 4+  Hip adduction   5 4  Hip internal rotation      Hip external rotation      Knee flexion 5 5    Knee extension 5 4- 5 5  Ankle dorsiflexion 4 4- 5 5  Ankle plantarflexion      Ankle inversion 5 5-    Ankle eversion 5 5-     (Blank rows = not tested)  FUNCTIONAL TESTS:  5 times sit to stand: 10.41 sec use of UE's for momentum   GAIT: Distance walked: 40 feet  Assistive device utilized: None Level of assistance: Complete Independence Comments: decreased wt bearing with notable limp with stance to toe off on L    [facility] Adult PT Treatment:                                                DATE: 01/08/23 Therapeutic Exercise: Nustep L6 x 6 min Incline board 30 sec x 3 knees straight, 30 sec x 3 knees bent  Runners step up 6 inch step x 20 R/L  SLS 20 sec x 3 L/R  Diver touch to chair x  10 L/R 2 sets on L  Heel raise bilat lowering with L eccentrically x 10  Heel raise in sitting to isolate soleus x 8; repeated with 15# KB x 10  Towel crunch and towel curl  Toe yoga Foot arch Standing stagger stance wt shift   Manual Therapy: STM/IASTM to L gastroc/soleus     [facility] Adult PT Treatment:                                                DATE: 01/06/23 Therapeutic Exercise: TM 2.2 mph  x 5 min Gas[place] stretch 30 sec  x 1 foot perpendicular to surface before and after treadmill  Soleus stretch 30 sec x 1 foot perpendicular to surface before and after treadmill Incline board ~ 45-60 sec knees straight, knees bent  Heel raise in sitting to isolate soleus x 8; repeated with 10# KB x 10   Manual Therapy: IASTM to L gastroc/soleus  Skilled palpation to assess response to manual work and dry needling  Trigger Point Dry-Needling  Treatment instructions: Expect mild to moderate muscle soreness.  Patient Consent Given: Yes Education handout provided: Previously provided Muscles treated: L gastroc/soleus  Electrical stimulation performed: Yes Parameters:  mAmp current intensity to patient tolerance Treatment response/outcome: decreased palpable tightness     [facility] Adult PT Treatment:  DATE: 01/01/23 Therapeutic Exercise: TM 1.3 mph  x 5 min Gasoc stretch 30 sec  x 1 foot perpendicular to surface  Soleus stretch 30 sec x 1 foot perpendicular to surface 90/90 isometric hip flexor with red band around mid feet alt leg press Standing hip flexor march red Tband x 10 ea (feels burn in lateral L achilles) S/L hip ADD 2x10 Prone hip ext 4# AW 3 sec hold 2 x 10 B cues for pelvic press/ab engagement Sidelying hip ABD 4#AW 2x10 B (watch for correct form)  Pallof press L side Blue x 10 Attempted Side plank and modified side plank but too much pressure on L UE. Manual Therapy: IASTM to L gastroc/soleus    PATIENT EDUCATION:  Education  details: POC; HEP  Person educated: Patient Education method: Explanation, Demonstration, Tactile cues, Verbal cues, and Handouts Education comprehension: verbalized understanding, returned demonstration, verbal cues required, tactile cues required, and needs further education  HOME EXERCISE PROGRAM: Access Code: [email or web address] URL: [email or web address] Date: 12/23/2022 Prepared by: Raynelle Fanning  Exercises - Supine Piriformis Stretch with Leg Straight  - 2 x daily - 7 x weekly - 1 sets - 3 reps - 30 sec  hold - Hooklying Hamstring Stretch with Strap  - 2 x daily - 7 x weekly - 1 sets - 3 reps - 30 sec  hold - Supine ITB Stretch with Strap  - 2 x daily - 7 x weekly - 1 sets - 3 reps - 30 sec  hold - Gastroc Stretch on Wall  - 2 x daily - 7 x weekly - 1 sets - 3 reps - 30 sec  hold - Soleus Stretch on Wall  - 2 x daily - 7 x weekly - 1 sets - 3 reps - 30 sec  hold - Supine Quadriceps Stretch with Strap on Table  - 2 x daily - 7 x weekly - 1 sets - 3 reps - 30-60 sec hold - Standing Eccentric Heel Raise  - 1 x daily - 7 x weekly - 3 sets - 10 reps - 3 sec hold  ASSESSMENT:  CLINICAL IMPRESSION:  Yeimi reports significant improvement in symptoms following last treatment. She has no pain during the day but notices some tightness at the end of the day. She will hold a few more weeks before returning to tennis. Noted decreased palpable tightness through L gastroc and soleus; improved gait pattern ith no noticeable limp L LE in stance to toe off. Added balance and foot exercises to HEP. Will continue DN and manual work through L calf as indicated. Progress with exercises and activities for return to tennis focus on eccentric strengthening. Progressing well toward stated goals of therapy.    She also demonstrates left lumbar weakness with prone hip extensions on the R LE. Unsure if back is contributing to L LE pain. She is still very tight and tender with STM to L soleus. She would benefit from  foam rolling education to her lower extremities. Significant increase in LE strength compared to eval.  Eval: Patient is a 52 y.o. female who was seen today for physical therapy evaluation and treatment for L leg pain. She reports initial pain in the L knee and into L calf area at least 7 months ago. She had PT for the knee and dry needling for the calf with some improvement but then experienced pain in the L calf again beginning in June with symptoms increasing in the past month. Patient has no know injury. She does  have a history of DVT's in 2003 and 2021. [place] 9/24 negative for DVTs. Patient presents with abnormal gait pattern; muscular tightness to palpation; decreased ROM; weakness in L > R hips; knees and ankles. Patient will benefit from comprehensive PT program to address LE symptoms.   OBJECTIVE IMPAIRMENTS: Abnormal gait, decreased activity tolerance, decreased ROM, decreased strength, hypomobility, impaired flexibility, improper body mechanics, postural dysfunction, obesity, and pain.    GOALS: Goals reviewed with patient? Yes  SHORT TERM GOALS: Target date: 01/29/2023   Independent in initial HEP  Baseline: Goal status: INITIAL  2.  Increase AROM L bilat ankle DF by 3-5 degrees  Baseline:  Goal status: INITIAL   LONG TERM GOALS: Target date: 03/12/2023   Decrease pain L calf by 75-100% allowing patient to return to all normal functional activities  Baseline:  Goal status: INITIAL  2.  5/5 strength bilat LE's  Baseline:  Goal status: INITIAL  3.  Ambulation for 20-30 min with pain no greater than 1-2/10 and no limp  Baseline:  Goal status: INITIAL  4.  Patient returns to full functional and recreational activities including return to tennis  Baseline:  Goal status: INITIAL  5.  Independent in HEP, including aquatic therapy as indicated  Baseline:  Goal status: INITIAL  6.  Improve functional limitation 60  Baseline: 48 Goal status: INITIAL   PLAN:  PT  FREQUENCY: 2x/week  PT DURATION: 12 weeks  PLANNED INTERVENTIONS: Therapeutic exercises, Therapeutic activity, Neuromuscular re-education, Balance training, Gait training, Patient/Family education, Self Care, Joint mobilization, Aquatic Therapy, and Dry Needling; modalities; manual work ;   Higher education careers adviser FOR NEXT SESSION: review and progress HEP; manual work, DN,modalities as indicated - need to address LE symptoms   Add hip flexor stretch (sits at desk/computer all day); add hip abduction and extension strengthening; DN and STM/IASTM for calf L/R   Progress eccentric strengthening    Jeanean Hollett P. Leonor Liv PT, MPH 01/08/23 11:57 AM

## 2023-01-08 NOTE — Telephone Encounter (Signed)
Per imaging - the results and final report are now available for the provider.

## 2023-01-14 ENCOUNTER — Encounter: Payer: Self-pay | Admitting: Rehabilitative and Restorative Service Providers"

## 2023-01-14 ENCOUNTER — Ambulatory Visit: Payer: BC Managed Care – PPO | Admitting: Rehabilitative and Restorative Service Providers"

## 2023-01-14 DIAGNOSIS — R29898 Other symptoms and signs involving the musculoskeletal system: Secondary | ICD-10-CM

## 2023-01-14 DIAGNOSIS — M79662 Pain in left lower leg: Secondary | ICD-10-CM | POA: Diagnosis not present

## 2023-01-14 DIAGNOSIS — M6281 Muscle weakness (generalized): Secondary | ICD-10-CM

## 2023-01-14 DIAGNOSIS — R2689 Other abnormalities of gait and mobility: Secondary | ICD-10-CM

## 2023-01-14 NOTE — Therapy (Signed)
OUTPATIENT PHYSICAL THERAPY LOWER EXTREMITY TREATMENT   Patient Name: Kristen Meyers MRN: [id number] DOB:Jul 18, 1970, 52 y.o., female Today's Date: 01/14/2023  END OF SESSION:  PT End of Session - 01/14/23 1101     Visit Number 8    Number of Visits 24    Date for PT Re-Evaluation 03/12/23    Authorization Type [employer] copay 40    Authorization Time Period year - 42 remaing for year per TL    Authorization - Visit Number 8    Authorization - Number of Visits 60    PT Start Time 1102    PT Stop Time 1203    PT Time Calculation (min) 61 min    Activity Tolerance Patient tolerated treatment well    Behavior During Therapy WFL for tasks assessed/performed            Past Medical History:  Diagnosis Date   Acute DVT (deep venous thrombosis) (HCC)    DVT 2012-january, L leg, in the context of post-C section. Again 2021, L leg, dx'd shortly after having covid infection->took eliquis x 43mo.   Cholelithiasis without obstruction 09/19/09 (u/s)   COVID-19 virus infection    approx 11/09/19---got antibody infusion   Diabetes mellitus without complication (HCC) 03/21/2010   Elevated transaminase level 07/19/2012   Essential hypertension 03/21/2010   Qualifier: Diagnosis of   By: Gabriel Rung LPN, Enid Baas liver 09/19/09 (u/s)   Mildly elevated transaminases   GERD 03/21/2010   History of adenomatous polyp of colon 12/2018   2020.  Normal 01/14/22. 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  01/14/22 no polyps. Recall 5 yrs.   Coronary calcium score  10/2022   Score 26, 90th%'tile   LAPAROSCOPIC HYSTERECTOMY  02/2014   Ovaries remain (tubes out): done for DUB   TRANSTHORACIC ECHOCARDIOGRAM     11/2022 EF 55-60%, grd I DD, AV sclerosis w/out stenosis.   TUBAL LIGATION  2011   Dr. Malachy Mood is her GYN.   WISDOM TOOTH EXTRACTION     Patient Active Problem List   Diagnosis Date Noted   Chronic elbow pain, right 12/11/2022   Left leg pain 12/11/2022   Chest pain of uncertain etiology 10/24/2022   Cardiac murmur 10/24/2022   Acute DVT (deep venous thrombosis) (HCC)    Cholelithiasis without obstruction    COVID-19 virus infection    Fatty liver    Obesity, Class II, BMI 35-39.9    Seizures (HCC)    History of laparoscopic-assisted vaginal hysterectomy 02/14/2020   Deep venous thrombosis (HCC) 02/14/2020   Subclinical hypothyroidism 2021   Melanocytic nevi, unspecified 12/30/2018   History of adenomatous polyp of colon 12/2018   Diabetes mellitus without complication (HCC) 06/22/2015  Health maintenance examination 07/20/2013   NAFLD (nonalcoholic fatty liver disease) 16/12/9602   Obesity, morbid (HCC) 10/19/2012   Paresthesia of left arm 09/30/2012   Low back pain 09/30/2012   Left shoulder pain 09/30/2012   Type II or unspecified type diabetes mellitus without mention of complication, uncontrolled 07/19/2012   Elevated transaminase level 07/19/2012   History of herpes zoster 03/2012   Hyperlipidemia 03/21/2010   Essential hypertension 03/21/2010   GERD 03/21/2010    PCP: Dr Earley Favor REFERRING PROVIDER: Dr Rodney Langton REFERRING DIAG: L leg pain  THERAPY DIAG:  Pain of left calf  Other symptoms and signs involving the musculoskeletal system  Muscle weakness (generalized)  Other abnormalities of gait and  mobility  Rationale for Evaluation and Treatment: Rehabilitation  ONSET DATE: 08/16/22  SUBJECTIVE:   SUBJECTIVE STATEMENT: The patient has had a good week. She notes a catch in her R hip this morning. She is doing 2x/week with trainer.  She did a single leg stand with trainer yesterday and feels her heel is more angry today.  Eval: Patient reports that she has had L leg pain for several months with no known injury. Symptoms have increased in the past month. She has pain and inflammation in the L leg on constant basis now. Pain was intermittent prior to the past month. Had pain in the L knee 3/24 "floating knee cap", then L calf pain. Went to PT with some improvement in the knee with exercise and DN but she stated having pain again 6/24  PERTINENT HISTORY: AODM; HTN; elevated cholesterol; knee pain bilat; R elbow pain for ~ past 6-7 months did PT for tennis elbow with some improvement then symptoms started getting worse; history ot L LE DVT's 2003 and 2021  PAIN:  Are you having pain? Yes: NPRS scale: 0/10 today; worst 4/10; persistent tightness  Pain location: pain is in the L heel now Pain description: sharp Aggravating factors: tennis; driving; standing; walking; sitting at desk or upright position Relieving factors: recliner; OTC meds; ice  PAIN:  Are you having pain? Yes: NPRS scale: 10/10 Pain location: right QL Pain description: sharp, spasm, grabbing Aggravating factors: unsure, movement Relieving factors: heat, TENS  PRECAUTIONS: Other: history ot L LE DVT's 2003 and 2021 - [place] negative for DVT's 12/11/22  WEIGHT BEARING RESTRICTIONS: No  FALLS:  Has patient fallen in last 6 months? 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  DIAGNOSTIC FINDINGS: 12/11/22: Venous Img Lower L leg; Directed duplex of the left lower extremity negative for DVT   MUSCLE LENGTH: Hamstrings: Right 65 deg; Left 60 deg Thomas test:not tested - tight hip flexors to palpation   POSTURE: rounded shoulders, forward head, flexed trunk , and weight shift right  PALPATION: Tenderness and tightness to palpation L > R hip flexors; posterior hip; gastroc-soleus complex   LOWER EXTREMITY ROM: tight L > R hip Active ROM Right eval Left eval  Hip flexion    Hip extension    Hip abduction    Hip adduction    Hip internal rotation    Hip external rotation    Knee flexion    Knee extension    Ankle dorsiflexion 9 5  Ankle  plantarflexion    Ankle inversion 38 35  Ankle eversion 28 21 pain anterior leg    (Blank rows = not tested)  LOWER EXTREMITY MMT: MMT Right eval Left eval Right 01/01/23 Left 01/01/23  Hip flexion 4+ 4+ 4+ 4+  Hip extension 3+ 3+ 5 5  Hip abduction 4 4- 5 4+  Hip adduction   5 4  Hip internal rotation      Hip external rotation      Knee flexion 5 5    Knee extension 5 4- 5 5  Ankle dorsiflexion 4 4- 5 5  Ankle plantarflexion      Ankle inversion 5 5-    Ankle eversion 5 5-     (Blank rows = not tested)  FUNCTIONAL TESTS:  5 times sit to stand: 10.41 sec use of UE's for momentum   GAIT: Distance walked: 40 feet  Assistive device utilized: None Level of assistance: Complete Independence Comments: decreased wt bearing with notable limp with stance to toe off on L    [facility] Adult PT Treatment:                                                DATE: 01/14/23 Therapeutic Exercise: Treadmill x 2.2 mph x 5 minutes with bilat UE support Standing Heel cord stretch off back of step Runner's step up x 10 reps R and L  Slant board 20 seconds x 2 reps bilat SLS x 20 seconds R and L SLS on foam cushion x 20 seconds R and L With foot on 12" surface anteriorly loading moving  knee over toe -- some L knee pain Supine HS stretch R and L x 5 reps x 20 seconds Prone  hip extension with cues to reduce knee flexion x 10 reps R and L Sidelying Arc abduction x 10 reps R and L Manual Therapy: STM L gastroc, L soleous, plantar surface of foot PROM great toe extension Joint moiblization anterior drawer grade II Lateral drawer grade II Modalities: TENS and moist hot pack x 10 minutes R QL region   [facility] Adult PT Treatment:                                                DATE: 01/08/23 Therapeutic Exercise: Nustep L6 x 6 min Incline board 30 sec x 3 knees straight, 30 sec x 3 knees bent  Runners step up 6 inch step x 20 R/L  SLS 20 sec x 3 L/R  Diver touch to chair x 10 L/R 2 sets on L  Heel raise bilat lowering with L eccentrically x 10  Heel raise in sitting to isolate soleus x 8; repeated with 15# KB x 10  Towel crunch and towel curl  Toe yoga Foot arch Standing stagger stance wt shift  Manual Therapy: STM/IASTM to L gastroc/soleus     [facility] Adult PT Treatment:                                                DATE: 01/06/23 Therapeutic Exercise: TM 2.2 mph  x 5 min  Gasoc stretch 30 sec  x 1 foot perpendicular to surface before and after treadmill  Soleus stretch 30 sec x 1 foot perpendicular to surface before and after treadmill Incline board ~ 45-60 sec knees straight, knees bent  Heel raise in sitting to isolate soleus x 8; repeated with 10# KB x 10  Manual Therapy: IASTM to L gastroc/soleus  Skilled palpation to assess response to manual work and dry needling  Trigger Point Dry-Needling  Treatment instructions: Expect mild to moderate muscle soreness. Patient Consent Given: Yes Education handout provided: Previously provided Muscles treated: L gastroc/soleus  Electrical stimulation performed: Yes Parameters:  mAmp current intensity to patient tolerance Treatment response/outcome: decreased palpable tightness    [facility] Adult PT Treatment:                                                 DATE: 01/01/23 Therapeutic Exercise: TM 1.3 mph  x 5 min Gasoc stretch 30 sec  x 1 foot perpendicular to surface  Soleus stretch 30 sec x 1 foot perpendicular to surface 90/90 isometric hip flexor with red band around mid feet alt leg press Standing hip flexor march red Tband x 10 ea (feels burn in lateral L achilles) S/L hip ADD 2x10 Prone hip ext 4# AW 3 sec hold 2 x 10 B cues for pelvic press/ab engagement Sidelying hip ABD 4#AW 2x10 B (watch for correct form)  Pallof press L side Blue x 10 Attempted Side plank and modified side plank but too much pressure on L UE. Manual Therapy: IASTM to L gastroc/soleus    PATIENT EDUCATION:  Education details: POC; HEP  Person educated: Patient Education method: Explanation, Demonstration, Tactile cues, Verbal cues, and Handouts Education comprehension: verbalized understanding, returned demonstration, verbal cues required, tactile cues required, and needs further education  HOME EXERCISE PROGRAM: Access Code: [email or web address] URL: [email or web address] Date: 01/14/2023 Prepared by: Margretta Ditty  Exercises - Supine Piriformis Stretch with Leg Straight  - 2 x daily - 7 x weekly - 1 sets - 3 reps - 30 sec  hold - Hooklying Hamstring Stretch with Strap  - 2 x daily - 7 x weekly - 1 sets - 3 reps - 30 sec  hold - Supine ITB Stretch with Strap  - 2 x daily - 7 x weekly - 1 sets - 3 reps - 30 sec  hold - Gastroc Stretch on Wall  - 2 x daily - 7 x weekly - 1 sets - 3 reps - 30 sec  hold - Soleus Stretch on Wall  - 2 x daily - 7 x weekly - 1 sets - 3 reps - 30 sec  hold - Supine Quadriceps Stretch with Strap on Table  - 2 x daily - 7 x weekly - 1 sets - 3 reps - 30-60 sec hold - Standing Eccentric Heel Raise  - 1 x daily - 7 x weekly - 3 sets - 10 reps - 3 sec hold - Single Leg Stance  - 1 x daily - 7 x weekly - 2 sets - 3-5 reps - 20 sec hold - The Diver  - 1 x daily - 7 x weekly - 1 sets - 10 reps -  2-3 sec  hold - Towel Scrunches  - 1 x daily - 7 x weekly - 1 sets - 3 reps - 30 sec  hold -  Toe Yoga - Alternating Great Toe and Lesser Toe Extension  - 1 x daily - 7 x weekly - 1 sets - 10 reps - 3 sec  hold - Arch Lifting  - 1 x daily - 7 x weekly - 1 sets - 10-15 reps - 2-3 sec  hold - sidelying low back stretch  - 1 x daily - 7 x weekly - 1 sets - 3 reps - 30 seconds hold  ASSESSMENT:  CLINICAL IMPRESSION:  The patient notes calf pain is improved. She arrived today with a catching sensationin R QL region. She tolerated standing exercise and stretching activities well. Then at end of session, she experienced a spasm when rising from mat table. PT ended with TENS and heat to reduce acute R QL muscle spasms. Will continue DN and manual work through L calf as indicated. Progress with exercises and activities for return to tennis focus on eccentric strengthening. Progressing well toward stated goals of therapy.    Eval: Patient is a 52 y.o. female who was seen today for physical therapy evaluation and treatment for L leg pain. She reports initial pain in the L knee and into L calf area at least 7 months ago. She had PT for the knee and dry needling for the calf with some improvement but then experienced pain in the L calf again beginning in June with symptoms increasing in the past month. Patient has no know injury. She does have a history of DVT's in 2003 and 2021. [place] 9/24 negative for DVTs. Patient presents with abnormal gait pattern; muscular tightness to palpation; decreased ROM; weakness in L > R hips; knees and ankles. Patient will benefit from comprehensive PT program to address LE symptoms.   OBJECTIVE IMPAIRMENTS: Abnormal gait, decreased activity tolerance, decreased ROM, decreased strength, hypomobility, impaired flexibility, improper body mechanics, postural dysfunction, obesity, and pain.    GOALS: Goals reviewed with patient? Yes  SHORT TERM GOALS: Target date:  01/29/2023  Independent in initial HEP  Baseline: Goal status: INITIAL  2.  Increase AROM L bilat ankle DF by 3-5 degrees  Baseline:  Goal status: INITIAL  LONG TERM GOALS: Target date: 03/12/2023  Decrease pain L calf by 75-100% allowing patient to return to all normal functional activities  Baseline:  Goal status: INITIAL  2.  5/5 strength bilat LE's  Baseline:  Goal status: INITIAL  3.  Ambulation for 20-30 min with pain no greater than 1-2/10 and no limp  Baseline:  Goal status: INITIAL  4.  Patient returns to full functional and recreational activities including return to tennis  Baseline:  Goal status: INITIAL  5.  Independent in HEP, including aquatic therapy as indicated  Baseline:  Goal status: INITIAL  6.  Improve functional limitation 60  Baseline: 48 Goal status: INITIAL   PLAN:  PT FREQUENCY: 2x/week  PT DURATION: 12 weeks  PLANNED INTERVENTIONS: Therapeutic exercises, Therapeutic activity, Neuromuscular re-education, Balance training, Gait training, Patient/Family education, Self Care, Joint mobilization, Aquatic Therapy, and Dry Needling; modalities; manual work ;   Higher education careers adviser FOR NEXT SESSION: Check on how R QL is doing, review and progress HEP; manual work, DN,modalities as indicated - need to address LE symptoms   Add hip flexor stretch (sits at desk/computer all day); add hip abduction and extension strengthening; DN and STM/IASTM for calf L/R   Progress eccentric strengthening   Madisen Ludvigsen, PT 01/14/2023

## 2023-01-16 ENCOUNTER — Ambulatory Visit: Payer: BC Managed Care – PPO

## 2023-01-16 ENCOUNTER — Ambulatory Visit: Payer: BC Managed Care – PPO | Admitting: Family Medicine

## 2023-01-16 VITALS — BP 122/86 | HR 66 | Wt 208.0 lb

## 2023-01-16 DIAGNOSIS — M62830 Muscle spasm of back: Secondary | ICD-10-CM | POA: Diagnosis not present

## 2023-01-16 LAB — MAGNESIUM: Magnesium: 1.9 mg/dL (ref 1.5–2.5)

## 2023-01-16 LAB — BASIC METABOLIC PANEL
BUN: 18 mg/dL (ref 6–23)
CO2: 27 meq/L (ref 19–32)
Calcium: 9.8 mg/dL (ref 8.4–10.5)
Chloride: 104 meq/L (ref 96–112)
Creatinine, Ser: 0.71 mg/dL (ref 0.40–1.20)
GFR: 96.66 mL/min (ref >60.00)
Glucose, Bld: 184 mg/dL — ABNORMAL HIGH (ref 70–99)
Potassium: 4.5 meq/L (ref 3.5–5.1)
Sodium: 140 meq/L (ref 135–145)

## 2023-01-16 MED ORDER — METAXALONE 400 MG PO TABS: ORAL_TABLET | ORAL | 1 refills | Status: DC

## 2023-01-16 NOTE — Progress Notes (Signed)
OFFICE VISIT  01/16/2023  CC:  Chief Complaint  Patient presents with   Spasms    Spasms started on Tues.; Pt states pain is 10/10. Pt states when she moves a certain way it sets the spasm off. Pt has been taking her already prescribed anti-inflammatory med and extra strength tylenol. Pt has found some relief.     Patient is a 52 y.o. female who presents for right low back spasms.  HPI: Onset 3 days ago of intermittent spasm pain in the right low back.  No recent injury or overuse.  She has been drinking more caffeine lately than her usual. Sitting still she has no pain.  When she turns her torso or bends to the side it tends to spasm.  Past Medical History:  Diagnosis Date   Acute DVT (deep venous thrombosis) (HCC)    DVT 2012-january, L leg, in the context of post-C section. Again 2021, L leg, dx'd shortly after having covid infection->took eliquis x 107mo.   Cholelithiasis without obstruction 09/19/09 (u/s)   COVID-19 virus infection    approx 11/09/19---got antibody infusion   Diabetes mellitus without complication (HCC) 03/21/2010   Elevated transaminase level 07/19/2012   Essential hypertension 03/21/2010   Qualifier: Diagnosis of   By: Gabriel Rung LPN, Enid Baas liver 09/19/09 (u/s)   Mildly elevated transaminases   GERD 03/21/2010   History of adenomatous polyp of colon 12/2018   2020.  Normal 01/14/22. Recall 5 yrs   History of herpes zoster 03/2012   Hyperlipidemia 03/21/2010   Qualifier: Diagnosis of   By: Gabriel Rung LPN, Harriett Sine         Melanocytic nevi, unspecified 12/30/2018   NAFLD (nonalcoholic fatty liver disease) 09/81/1914   Obesity, Class II, BMI 35-39.9    Paresthesia of left arm 09/30/2012   Seizures (HCC)    as child- had 2 none since- no current treatment- never dx'd with any seizure d/o    Subclinical hypothyroidism 2021   2021-22->multinod goiter on u/s 01/2021    Past Surgical History:  Procedure Laterality Date   CARDIOVASCULAR STRESS TEST     MPI  11/2022 normal   CESAREAN SECTION     2003, 2005   COLONOSCOPY  01/05/2019   2020 polyps.  01/14/22 no polyps. Recall 5 yrs.   Coronary calcium score  10/2022   Score 26, 90th%'tile   LAPAROSCOPIC HYSTERECTOMY  02/2014   Ovaries remain (tubes out): done for DUB   TRANSTHORACIC ECHOCARDIOGRAM     11/2022 EF 55-60%, grd I DD, AV sclerosis w/out stenosis.   TUBAL LIGATION  2011   Dr. Malachy Mood is her GYN.   WISDOM TOOTH EXTRACTION      Outpatient Medications Prior to Visit  Medication Sig Dispense Refill   celecoxib (CELEBREX) 100 MG capsule 1 capsule p.o. 2-3 times daily 90 capsule 11   dapagliflozin propanediol (FARXIGA) 10 MG TABS tablet TAKE 1 TABLET DAILY 90 tablet 0   enalapril (VASOTEC) 20 MG tablet TAKE 2 TABLETS DAILY 180 tablet 3   hydrochlorothiazide (HYDRODIURIL) 12.5 MG tablet Take 1 tablet (12.5 mg total) by mouth daily. 90 tablet 1   metFORMIN (GLUCOPHAGE) 1000 MG tablet Take 1 tablet (1,000 mg total) by mouth 2 (two) times daily with a meal. 180 tablet 3   metoprolol succinate (TOPROL-XL) 50 MG 24 hr tablet Take 3 tablets (150 mg total) by mouth daily. Take with or immediately following a meal. 270 tablet 1   nitroGLYCERIN (NITRODUR -  DOSED IN MG/24 HR) 0.2 mg/hr patch Cut and apply 1/4 patch to most painful area q24h. 30 patch 11   rosuvastatin (CRESTOR) 20 MG tablet Take 1 tablet (20 mg total) by mouth daily. (Patient taking differently: Take 40 mg by mouth daily.) 90 tablet 3   No facility-administered medications prior to visit.    Allergies  Allergen Reactions   Definity [Perflutren Lipid Microsphere] Hypertension     shoulder pain, back pain , chills and HTN   Codeine Hives    Review of Systems  As per HPI  PE:    01/16/2023   11:00 AM 12/03/2022    3:47 PM 11/26/2022   10:09 AM  Vitals with BMI  Height   5\' 3"   Weight 208 lbs 207 lbs 6 oz 208 lbs  BMI   36.85  Systolic 122 110   Diastolic 86 76   Pulse 66 80    Exam chaperoned by Cloe Motsinger,  CMA   Physical Exam  Gen: Alert, well appearing.  Patient is oriented to person, place, time, and situation. AFFECT: pleasant, lucid thought and speech. Her back is nontender to palpation in the midline and in the paraspinous regions bilaterally.  No palpable muscle spasm.  LABS:  Last metabolic panel Lab Results  Component Value Date   GLUCOSE 200 (H) 11/10/2022   NA 141 11/10/2022   K 4.9 11/10/2022   CL 105 11/10/2022   CO2 23 11/10/2022   BUN 14 11/10/2022   CREATININE 0.66 11/10/2022   EGFR 105 11/10/2022   CALCIUM 9.0 11/10/2022   PROT 6.8 12/10/2022   ALBUMIN 4.3 12/10/2022   LABGLOB 2.0 11/10/2022   BILITOT 0.3 12/10/2022   ALKPHOS 83 12/10/2022   AST 17 12/10/2022   ALT 25 12/10/2022   ANIONGAP 12 09/15/2022    IMPRESSION AND PLAN:  Right low back muscle spasms. Recommended she decrease caffeine intake and increase intake of clear fluids. Skelaxin 400 mg, 1 3 times daily as needed prescribed today. Electrolytes monitoring today.  An After Visit Summary was printed and given to the patient.  FOLLOW UP: Return if symptoms worsen or fail to improve.  Signed:  Santiago Bumpers, MD           01/16/2023

## 2023-01-20 ENCOUNTER — Encounter: Payer: Self-pay | Admitting: Family Medicine

## 2023-01-20 ENCOUNTER — Ambulatory Visit: Payer: BC Managed Care – PPO | Attending: Sports Medicine | Admitting: Rehabilitative and Restorative Service Providers"

## 2023-01-20 ENCOUNTER — Encounter: Payer: Self-pay | Admitting: Rehabilitative and Restorative Service Providers"

## 2023-01-20 DIAGNOSIS — R29898 Other symptoms and signs involving the musculoskeletal system: Secondary | ICD-10-CM

## 2023-01-20 DIAGNOSIS — M25521 Pain in right elbow: Secondary | ICD-10-CM | POA: Insufficient documentation

## 2023-01-20 DIAGNOSIS — G8929 Other chronic pain: Secondary | ICD-10-CM | POA: Insufficient documentation

## 2023-01-20 DIAGNOSIS — M545 Low back pain, unspecified: Secondary | ICD-10-CM

## 2023-01-20 DIAGNOSIS — R2689 Other abnormalities of gait and mobility: Secondary | ICD-10-CM

## 2023-01-20 DIAGNOSIS — M6281 Muscle weakness (generalized): Secondary | ICD-10-CM | POA: Diagnosis present

## 2023-01-20 DIAGNOSIS — M5459 Other low back pain: Secondary | ICD-10-CM | POA: Insufficient documentation

## 2023-01-20 DIAGNOSIS — M79662 Pain in left lower leg: Secondary | ICD-10-CM

## 2023-01-20 NOTE — Therapy (Signed)
OUTPATIENT PHYSICAL THERAPY LOWER EXTREMITY TREATMENT   Patient Name: Kristen Meyers MRN: [id number] DOB:Jun 04, 1970, 52 y.o., female Today's Date: 01/20/2023  END OF SESSION:  PT End of Session - 01/20/23 1152     Visit Number 9    Number of Visits 24    Date for PT Re-Evaluation 03/12/23    Authorization Type [employer] copay 40    Authorization Time Period year - 60 remaing for year per TL    Authorization - Visit Number 9    Authorization - Number of Visits 60    PT Start Time 1148    PT Stop Time 1230    PT Time Calculation (min) 42 min    Activity Tolerance Patient tolerated treatment well            Past Medical History:  Diagnosis Date   Achilles tendonosis of left lower extremity    Acute DVT (deep venous thrombosis) (HCC)    DVT 2012-january, L leg, in the context of post-C section. Again 2021, L leg, dx'd shortly after having covid infection->took eliquis x 67mo.   Cholelithiasis without obstruction 09/19/09 (u/s)   Chronic elbow pain, right    Dr. Karie Schwalbe, MRI 2024->tendonopathy/partial tear of distal biceps tendon, thinning or partial tear of the lateral ulnar collateral ligament, spurring of the radial head and capitellum.   COVID-19 virus infection    approx 11/09/19---got antibody infusion   Diabetes mellitus without complication (HCC) 03/21/2010   Elevated transaminase level 07/19/2012   Essential hypertension 03/21/2010   Qualifier: Diagnosis of   By: Gabriel Rung LPN, Enid Baas liver 09/19/09 (u/s)   Mildly elevated transaminases   GERD 03/21/2010   History of adenomatous polyp of colon 12/2018   2020.  Normal 01/14/22. 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  01/14/22 no polyps. Recall 5 yrs.   Coronary calcium score  10/2022   Score 26, 90th%'tile   LAPAROSCOPIC HYSTERECTOMY  02/2014   Ovaries remain (tubes out): done for DUB   TRANSTHORACIC ECHOCARDIOGRAM     11/2022 EF 55-60%, grd I DD, AV sclerosis w/out stenosis.   TUBAL LIGATION  2011   Dr. Malachy Mood is her GYN.   WISDOM TOOTH EXTRACTION     Patient Active Problem List   Diagnosis Date Noted   Chronic elbow pain, right 12/11/2022   Left leg pain 12/11/2022   Chest pain of uncertain etiology 10/24/2022   Cardiac murmur 10/24/2022   Acute DVT (deep venous thrombosis) (HCC)    Cholelithiasis without obstruction    COVID-19 virus infection    Fatty liver    Obesity, Class II, BMI 35-39.9    Seizures (HCC)    History of laparoscopic-assisted vaginal hysterectomy 02/14/2020  Deep venous thrombosis (HCC) 02/14/2020   Subclinical hypothyroidism 2021   Melanocytic nevi, unspecified 12/30/2018   History of adenomatous polyp of colon 12/2018   Diabetes mellitus without complication (HCC) 06/22/2015   Health maintenance examination 07/20/2013   NAFLD (nonalcoholic fatty liver disease) 16/12/9602   Obesity, morbid (HCC) 10/19/2012   Paresthesia of left arm 09/30/2012   Low back pain 09/30/2012   Left shoulder pain 09/30/2012   Type II or unspecified type diabetes mellitus without mention of complication, uncontrolled 07/19/2012   Elevated transaminase level 07/19/2012   History of herpes zoster 03/2012   Hyperlipidemia 03/21/2010   Essential hypertension 03/21/2010   GERD 03/21/2010    PCP: Dr Earley Favor REFERRING PROVIDER: Dr Rodney Langton REFERRING  DIAG: L leg pain  THERAPY DIAG:  Pain of left calf  Other symptoms and signs involving the musculoskeletal system  Muscle weakness (generalized)  Other abnormalities of gait and mobility  Rationale for Evaluation and Treatment: Rehabilitation  ONSET DATE: 08/16/22  SUBJECTIVE:   SUBJECTIVE STATEMENT: The patient reports that had another muscle spasm in the R LB the day after she ws in therapy. She saw Dr Milinda Cave Friday 01/16/23 and he gave her a muscle relaxer which has helped a lot. She still has some tightness and discomfort.   Calf and heel cord fell better. There is only a small area of tightness in the heel area but no pain.    Eval: Patient reports that she has had L leg pain for several months with no known injury. Symptoms have increased in the past month. She has pain and inflammation in the L leg on constant basis now. Pain was intermittent prior to the past month. Had pain in the L knee 3/24 "floating knee cap", then L calf pain. Went to PT with some improvement in the knee with exercise and DN but she stated having pain again 6/24  PERTINENT HISTORY: AODM; HTN; elevated cholesterol; knee pain bilat; R elbow pain for ~ past 6-7 months did PT for tennis elbow with some improvement then symptoms started getting worse; history ot L LE DVT's 2003 and 2021  PAIN:  Are you having pain? Yes: NPRS scale: 0/10 today; worst 4/10; persistent tightness  Pain location: pain is in the L heel now Pain description: sharp Aggravating factors: tennis; driving; standing; walking; sitting at desk or upright position Relieving factors: recliner; OTC meds; ice  PAIN:  Are you having pain? Yes: NPRS scale: 10/10 Pain location: right QL Pain description: sharp, spasm, grabbing Aggravating factors: unsure, movement Relieving factors: heat, TENS  PRECAUTIONS: Other: history ot L LE DVT's 2003 and 2021 - [place] negative for DVT's 12/11/22  WEIGHT BEARING RESTRICTIONS: No  FALLS:  Has patient  fallen in last 6 months? 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  DIAGNOSTIC FINDINGS: 12/11/22: Venous Img Lower L leg; Directed duplex of the left lower extremity negative for DVT   MUSCLE LENGTH: Hamstrings: Right 65 deg; Left 60 deg Thomas test:not tested - tight hip flexors to palpation   POSTURE: rounded shoulders, forward head, flexed trunk , and weight shift right  PALPATION: Tenderness and tightness to palpation L > R hip flexors; posterior hip; gastroc-soleus complex  LOWER EXTREMITY ROM: tight L > R hip Active ROM Right eval Left eval  Hip flexion    Hip extension    Hip abduction    Hip adduction    Hip internal rotation    Hip external rotation    Knee flexion    Knee extension    Ankle dorsiflexion 9 5  Ankle plantarflexion    Ankle inversion 38 35  Ankle eversion 28 21 pain anterior leg    (Blank rows = not tested)  LOWER EXTREMITY MMT: MMT Right eval Left eval Right 01/01/23 Left 01/01/23  Hip flexion 4+ 4+ 4+ 4+  Hip extension 3+ 3+ 5 5  Hip abduction 4 4- 5 4+  Hip adduction   5 4  Hip internal rotation      Hip external rotation      Knee flexion 5 5    Knee extension 5 4- 5 5  Ankle dorsiflexion 4 4- 5 5  Ankle plantarflexion      Ankle inversion 5 5-    Ankle eversion 5 5-     (Blank rows = not tested)  FUNCTIONAL TESTS:  5 times sit to stand: 10.41 sec use of UE's for momentum   GAIT: Distance walked: 40 feet  Assistive device utilized: None Level of assistance: Complete Independence Comments: decreased wt bearing with notable limp with stance to toe off on L    [facility] Adult PT Treatment:                                                DATE:  01/19/23 Therapeutic Exercise: Treadmill x 2.2 mph x 6 minutes with bilat UE support Standing Walking laps in gym Slant board 20 seconds x 2 reps bilat SLS x 20 seconds R and L Sitting  Lateral trunk flexion to L 20 sec x 3  Supine Double knee to chest 20 sec x 5  Prone  hip extension with cues to reduce knee flexion x 10 reps R and L Manual Therapy: STM L gastroc, L soleous, plantar surface of foot PROM great toe extension Joint moiblization L talus and calcaneous   Modalities: TENS and moist hot pack x 10 minutes R QL region   [facility] Adult PT Treatment:                                                DATE: 01/14/23 Therapeutic Exercise: Treadmill x 2.2 mph x 5 minutes with bilat UE support Standing Heel cord stretch off back of step Runner's step up x 10 reps R and L  Slant board 20 seconds x 2 reps bilat SLS x 20 seconds R and L SLS on foam cushion x 20 seconds R and L With foot on 12" surface anteriorly loading moving knee over toe -- some L knee pain Supine HS stretch R and L x 5 reps x 20 seconds Prone  hip extension with cues to reduce knee flexion x 10 reps R and L Sidelying Arc abduction x 10 reps R and L Manual Therapy: STM L gastroc, L soleous, plantar surface of foot PROM great toe extension Joint moiblization anterior drawer grade II Lateral drawer grade II Modalities: TENS and moist hot pack x 10  minutes R QL region   [facility] Adult PT Treatment:                                                DATE: 01/08/23 Therapeutic Exercise: Nustep L6 x 6 min Incline board 30 sec x 3 knees straight, 30 sec x 3 knees bent  Runners step up 6 inch step x 20 R/L  SLS 20 sec x 3 L/R  Diver touch to chair x 10 L/R 2 sets on L  Heel raise bilat lowering with L eccentrically x 10  Heel raise in sitting to isolate soleus x 8; repeated with 15# KB x 10  Towel crunch and towel curl  Toe yoga Foot arch Standing stagger stance wt shift  Manual Therapy: STM/IASTM to L  gastroc/soleus     [facility] Adult PT Treatment:                                                DATE: 01/06/23 Therapeutic Exercise: TM 2.2 mph  x 5 min [place] stretch 30 sec  x 1 foot perpendicular to surface before and after treadmill  Soleus stretch 30 sec x 1 foot perpendicular to surface before and after treadmill Incline board ~ 45-60 sec knees straight, knees bent  Heel raise in sitting to isolate soleus x 8; repeated with 10# KB x 10  Manual Therapy: IASTM to L gastroc/soleus  Skilled palpation to assess response to manual work and dry needling  Trigger Point Dry-Needling  Treatment instructions: Expect mild to moderate muscle soreness. Patient Consent Given: Yes Education handout provided: Previously provided Muscles treated: L gastroc/soleus  Electrical stimulation performed: Yes Parameters:  mAmp current intensity to patient tolerance Treatment response/outcome: decreased palpable tightness    [facility] Adult PT Treatment:                                                DATE: 01/01/23 Therapeutic Exercise: TM 1.3 mph  x 5 min Gasoc stretch 30 sec  x 1 foot perpendicular to surface  Soleus stretch 30 sec x 1 foot perpendicular to surface 90/90 isometric hip flexor with red band around mid feet alt leg press Standing hip flexor march red Tband x 10 ea (feels burn in lateral L achilles) S/L hip ADD 2x10 Prone hip ext 4# AW 3 sec hold 2 x 10 B cues for pelvic press/ab engagement Sidelying hip ABD 4#AW 2x10 B (watch for correct form)  Pallof press L side Blue x 10 Attempted Side plank and modified side plank but too much pressure on L UE. Manual Therapy: IASTM to L gastroc/soleus    PATIENT EDUCATION:  Education details: POC; HEP  Person educated: Patient Education method: Explanation, Demonstration, Tactile cues, Verbal cues, and Handouts Education comprehension: verbalized understanding, returned demonstration, verbal cues required, tactile cues required, and needs further  education  HOME EXERCISE PROGRAM: Access Code: [email or web address] URL: [email or web address] Date: 01/14/2023 Prepared by: Margretta Ditty  Exercises - Supine Piriformis Stretch with Leg Straight  - 2 x daily - 7 x weekly - 1 sets - 3 reps - 30 sec  hold - Hooklying Hamstring Stretch with Strap  - 2 x daily - 7 x weekly - 1 sets - 3 reps - 30 sec  hold - Supine ITB Stretch with Strap  - 2 x daily - 7 x weekly - 1 sets - 3 reps - 30 sec  hold - Gastroc Stretch on Wall  - 2 x daily - 7 x weekly - 1 sets - 3 reps - 30 sec  hold - Soleus Stretch on Wall  - 2 x daily - 7 x weekly - 1 sets - 3 reps - 30 sec  hold - Supine Quadriceps Stretch with Strap on Table  - 2 x daily - 7 x weekly - 1 sets - 3 reps - 30-60 sec hold - Standing Eccentric Heel Raise  - 1 x daily - 7 x weekly - 3 sets - 10 reps - 3 sec hold - Single Leg Stance  - 1 x daily - 7 x weekly - 2 sets - 3-5 reps - 20 sec hold - The Diver  - 1 x daily - 7 x weekly - 1 sets - 10 reps - 2-3 sec  hold - Towel Scrunches  - 1 x daily - 7 x weekly - 1 sets - 3 reps - 30 sec  hold - Toe Yoga - Alternating Great Toe and Lesser Toe Extension  - 1 x daily - 7 x weekly - 1 sets - 10 reps - 3 sec  hold - Arch Lifting  - 1 x daily - 7 x weekly - 1 sets - 10-15 reps - 2-3 sec  hold - sidelying low back stretch  - 1 x daily - 7 x weekly - 1 sets - 3 reps - 30 seconds hold  Access Code: [email or web address]   ASSESSMENT:  CLINICAL IMPRESSION:  The patient reports no pain in the L calf and heel - only sime tightness medially. Responds well to stretching and manual work. Minimal exercises today to avoid flare up of LBP. Patient will contact Dr Milinda Cave for referral for LBP but she does note that back pain is improved compared to last week.  Will continue DN and manual work through L calf as indicated. Progress with exercises and activities for return to tennis focus on eccentric strengthening. Progressing well toward stated goals of therapy.    Eval:  Patient is a 52 y.o. female who was seen today for physical therapy evaluation and treatment for L leg pain. She reports initial pain in the L knee and into L calf area at least 7 months ago. She had PT for the knee and dry needling for the calf with some improvement but then experienced pain in the L calf again beginning in June with symptoms increasing in the past month. Patient has no know injury. She does have a history of DVT's in 2003 and 2021. [place] 9/24 negative for DVTs. Patient presents with abnormal gait pattern; muscular tightness to palpation; decreased ROM; weakness in L > R hips; knees and ankles. Patient will benefit from comprehensive PT program to address LE symptoms.   OBJECTIVE IMPAIRMENTS: Abnormal gait, decreased activity tolerance, decreased ROM, decreased strength, hypomobility, impaired flexibility, improper body mechanics, postural dysfunction, obesity, and pain.    GOALS: Goals reviewed with patient? Yes  SHORT TERM GOALS: Target date: 01/29/2023  Independent in initial HEP  Baseline: Goal status: INITIAL  2.  Increase AROM L bilat ankle DF by 3-5 degrees  Baseline:  Goal status: INITIAL  LONG TERM GOALS:  Target date: 03/12/2023  Decrease pain L calf by 75-100% allowing patient to return to all normal functional activities  Baseline:  Goal status: INITIAL  2.  5/5 strength bilat LE's  Baseline:  Goal status: INITIAL  3.  Ambulation for 20-30 min with pain no greater than 1-2/10 and no limp  Baseline:  Goal status: INITIAL  4.  Patient returns to full functional and recreational activities including return to tennis  Baseline:  Goal status: INITIAL  5.  Independent in HEP, including aquatic therapy as indicated  Baseline:  Goal status: INITIAL  6.  Improve functional limitation 60  Baseline: 48 Goal status: INITIAL   PLAN:  PT FREQUENCY: 2x/week  PT DURATION: 12 weeks  PLANNED INTERVENTIONS: Therapeutic exercises, Therapeutic activity,  Neuromuscular re-education, Balance training, Gait training, Patient/Family education, Self Care, Joint mobilization, Aquatic Therapy, and Dry Needling; modalities; manual work ;   Higher education careers adviser FOR NEXT SESSION: Check on how R QL is doing, review and progress HEP; manual work, DN,modalities as indicated - need to address LE symptoms   Add hip flexor stretch (sits at desk/computer all day); add hip abduction and extension strengthening; DN and STM/IASTM for calf L/R   Progress eccentric strengthening. Further assessment of LBP as ordered by MD   Val Riles, PT 01/20/2023

## 2023-01-21 NOTE — Telephone Encounter (Signed)
OK, PT referral ordered. 

## 2023-01-22 DIAGNOSIS — Z1231 Encounter for screening mammogram for malignant neoplasm of breast: Secondary | ICD-10-CM | POA: Diagnosis not present

## 2023-01-22 DIAGNOSIS — Z13820 Encounter for screening for osteoporosis: Secondary | ICD-10-CM | POA: Diagnosis not present

## 2023-01-22 NOTE — Therapy (Signed)
OUTPATIENT PHYSICAL THERAPY TREATMENT  RE-EVALUATION    Patient Name: Kristen Meyers MRN: [id number] DOB:November 18, 1970, 52 y.o., female Today's Date: 01/23/2023  END OF SESSION:  PT End of Session - 01/23/23 1102     Visit Number 10    Number of Visits 26    Date for PT Re-Evaluation 03/21/23    Authorization Type [employer] copay 40    Authorization Time Period year - 48 remaing for year per TL    Authorization - Visit Number 10    Authorization - Number of Visits 60    PT Start Time 1102    PT Stop Time 1147    PT Time Calculation (min) 45 min    Activity Tolerance Patient tolerated treatment well    Behavior During Therapy WFL for tasks assessed/performed             Past Medical History:  Diagnosis Date   Achilles tendonosis of left lower extremity    Acute DVT (deep venous thrombosis) (HCC)    DVT 2012-january, L leg, in the context of post-C section. Again 2021, L leg, dx'd shortly after having covid infection->took eliquis x 59mo.   Cholelithiasis without obstruction 09/19/09 (u/s)   Chronic elbow pain, right    Dr. Karie Schwalbe, MRI 2024->tendonopathy/partial tear of distal biceps tendon, thinning or partial tear of the lateral ulnar collateral ligament, spurring of the radial head and capitellum.   COVID-19 virus infection    approx 11/09/19---got antibody infusion   Diabetes mellitus without complication (HCC) 03/21/2010   Elevated transaminase level 07/19/2012   Essential hypertension 03/21/2010   Qualifier: Diagnosis of   By: Gabriel Rung LPN, Enid Baas liver 09/19/09 (u/s)   Mildly elevated transaminases   GERD 03/21/2010   History of adenomatous polyp of colon 12/2018   2020.  Normal 01/14/22. 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  01/14/22 no polyps. Recall 5 yrs.   Coronary calcium score  10/2022   Score 26, 90th%'tile   LAPAROSCOPIC HYSTERECTOMY  02/2014   Ovaries remain (tubes out): done for DUB   TRANSTHORACIC ECHOCARDIOGRAM     11/2022 EF 55-60%, grd I DD, AV sclerosis w/out stenosis.   TUBAL LIGATION  2011   Dr. Malachy Mood is her GYN.   WISDOM TOOTH EXTRACTION     Patient Active Problem List   Diagnosis Date Noted   Chronic elbow pain, right 12/11/2022   Left leg pain 12/11/2022   Chest pain of uncertain etiology 10/24/2022   Cardiac murmur 10/24/2022   Acute DVT (deep venous thrombosis) (HCC)    Cholelithiasis without obstruction    COVID-19 virus infection    Fatty liver    Obesity, Class II, BMI 35-39.9  Seizures (HCC)    History of laparoscopic-assisted vaginal hysterectomy 02/14/2020   Deep venous thrombosis (HCC) 02/14/2020   Subclinical hypothyroidism 2021   Melanocytic nevi, unspecified 12/30/2018   History of adenomatous polyp of colon 12/2018   Diabetes mellitus without complication (HCC) 06/22/2015   Health maintenance examination 07/20/2013   NAFLD (nonalcoholic fatty liver disease) 82/95/6213   Obesity, morbid (HCC) 10/19/2012   Paresthesia of left arm 09/30/2012   Low back pain 09/30/2012   Left shoulder pain 09/30/2012   Type II or unspecified type diabetes mellitus without mention of complication, uncontrolled 07/19/2012   Elevated transaminase level 07/19/2012   History of herpes zoster 03/2012   Hyperlipidemia 03/21/2010   Essential hypertension 03/21/2010   GERD 03/21/2010    PCP:  Dr Earley Favor  REFERRING PROVIDER:  Dr Thornell Mule, Maryjean Morn, MD   REFERRING DIAG: L leg pain (Dr. Benjamin Stain)   M54.50 (ICD-10-CM) - Acute right-sided low back pain without sciatica (Dr. Milinda Cave)   THERAPY DIAG:  Pain of left calf  Other symptoms and signs involving the musculoskeletal system  Muscle weakness (generalized)  Other abnormalities of gait and mobility  Other low back pain  Rationale for Evaluation and Treatment: Rehabilitation  ONSET DATE: 08/16/22  SUBJECTIVE:   SUBJECTIVE STATEMENT: Patient with new referral for her back. Patient had a knot in the back for a couple months and aggravated it with weight training and then it began to spasm while in PT about a week ago. She does recall swinging her tennis racquet and felt a twinge in the back as her initial cause of pain. She kept playing when this occurred in September. She has noticed the knot before and spasms previously, but not to the extent of her recent pain. Patient reports her back is feeling better since last week. She did not have to take any muscle relaxers yesterday and did a mild workout without issues. Continues to take anti-inflammatories. No red flag symptoms. She has still not returned to tennis.    Eval: Patient reports that she has had L leg pain for several months with no known injury. Symptoms have increased in the past month. She has pain and inflammation in the L leg on constant basis now. Pain was intermittent prior to the past month. Had pain in the L knee 3/24 "floating knee cap", then L calf pain. Went to PT with some improvement in the knee with exercise and DN but she stated having pain again 6/24  PERTINENT HISTORY: AODM; HTN; elevated cholesterol; knee pain bilat; R elbow pain for ~ past 6-7 months did PT for tennis elbow with some improvement then symptoms started getting worse; history ot L LE DVT's 2003 and 2021  PAIN:  Are you having pain? Yes: NPRS scale:  3/10 Pain location: Lt heel  Pain description: tightness; tingling  Aggravating factors: tennis; driving; standing; walking; sitting at desk or upright position Relieving factors: recliner; OTC meds; ice  PAIN:  Are you having pain? Yes: NPRS scale: 0 currently; at worst 10/10 Pain location: right low back Pain description: spasm Aggravating factors: unsure, movement Relieving factors: heat, TENS , medication, stretching   PRECAUTIONS: Other: history ot L LE DVT's 2003 and 2021 - [place] negative for DVT's 12/11/22  WEIGHT BEARING RESTRICTIONS: No  FALLS:  Has patient fallen in last 6 months? 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  DIAGNOSTIC FINDINGS: 12/11/22: Venous Img Lower L leg; Directed duplex of the left lower extremity negative for DVT   MUSCLE LENGTH: Hamstrings: Right 65 deg; Left 60 deg Thomas test:not tested - tight hip flexors to palpation   POSTURE: rounded shoulders, forward head, flexed trunk , and weight shift right  PALPATION: Tenderness and tightness to palpation L > R hip flexors; posterior hip; gastroc-soleus complex   01/23/23: Rt ASIS and iliac crest elevated in standing; TTP Rt lumbar paraspinals, QL   LOWER EXTREMITY ROM: tight L > R hip Active ROM Right eval Left eval 01/23/23  Hip flexion     Hip extension     Hip abduction     Hip adduction     Hip internal rotation     Hip external rotation     Knee flexion     Knee extension     Ankle dorsiflexion 9 5 Lt: 10; Rt: 14  Ankle plantarflexion     Ankle inversion 38 35   Ankle eversion 28 21 pain anterior leg     (Blank rows = not tested)  LOWER EXTREMITY MMT: MMT Right eval  Left eval Right 01/01/23 Left 01/01/23 01/23/23   Hip flexion 4+ 4+ 4+ 4+ 5 bilateral   Hip extension 3+ 3+ 5 5 5  bilateral   Hip abduction 4 4- 5 4+ Lt: 4+; Rt: 4  Hip adduction   5 4   Hip internal rotation       Hip external rotation       Knee flexion 5 5     Knee extension 5 4- 5 5   Ankle dorsiflexion 4 4- 5 5   Ankle plantarflexion       Ankle inversion 5 5-     Ankle eversion 5 5-      (Blank rows = not tested) LUMBAR ROM:   Active  A/PROM  01/23/23  Flexion Full   Extension 25% limited   Right lateral flexion 25% limited pull  Left lateral flexion 25% limited pull   Right rotation 25% limited tight  Left rotation 25% limited tight    (Blank rows = not tested)  FUNCTIONAL TESTS:  5 times sit to stand: 10.41 sec use of UE's for momentum   GAIT: Distance walked: 40 feet  Assistive device utilized: None Level of assistance: Complete Independence Comments: decreased wt bearing with notable limp with stance to toe off on L   [facility] Adult PT Treatment:                                                DATE: 01/23/23 Therapeutic Exercise: LTR x 1 minute Open book x 10 each  Pelvic tilts x 10  3 way physioball rollout x 1 minute  Cat cow x 10  Updated HEP  Manual Therapy: Muscle energy technique to improve pelvic alignment   Self Care: Gym activities to avoid currently including rotational and excessive trunk flexion  [facility] Adult PT Treatment:  DATE: 01/19/23 Therapeutic Exercise: Treadmill x 2.2 mph x 6 minutes with bilat UE support Standing Walking laps in gym Slant board 20 seconds x 2 reps bilat SLS x 20 seconds R and L Sitting  Lateral trunk flexion to L 20 sec x 3  Supine Double knee to chest 20 sec x 5  Prone  hip extension with cues to reduce knee flexion x 10 reps R and L Manual Therapy: STM L gastroc, L soleous, plantar surface of foot PROM great toe extension Joint moiblization L talus and calcaneous    Modalities: TENS and moist hot pack x 10 minutes R QL region   [facility] Adult PT Treatment:                                                DATE: 01/14/23 Therapeutic Exercise: Treadmill x 2.2 mph x 5 minutes with bilat UE support Standing Heel cord stretch off back of step Runner's step up x 10 reps R and L  Slant board 20 seconds x 2 reps bilat SLS x 20 seconds R and L SLS on foam cushion x 20 seconds R and L With foot on 12" surface anteriorly loading moving knee over toe -- some L knee pain Supine HS stretch R and L x 5 reps x 20 seconds Prone  hip extension with cues to reduce knee flexion x 10 reps R and L Sidelying Arc abduction x 10 reps R and L Manual Therapy: STM L gastroc, L soleous, plantar surface of foot PROM great toe extension Joint moiblization anterior drawer grade II Lateral drawer grade II Modalities: TENS and moist hot pack x 10 minutes R QL region   [facility] Adult PT Treatment:                                                DATE: 01/08/23 Therapeutic Exercise: Nustep L6 x 6 min Incline board 30 sec x 3 knees straight, 30 sec x 3 knees bent  Runners step up 6 inch step x 20 R/L  SLS 20 sec x 3 L/R  Diver touch to chair x 10 L/R 2 sets on L  Heel raise bilat lowering with L eccentrically x 10  Heel raise in sitting to isolate soleus x 8; repeated with 15# KB x 10  Towel crunch and towel curl  Toe yoga Foot arch Standing stagger stance wt shift  Manual Therapy: STM/IASTM to L gastroc/soleus     PATIENT EDUCATION:  Education details: POC; HEP; assessment findings  Person educated: Patient Education method: Explanation, Demonstration, Tactile cues, Verbal cues, and Handouts Education comprehension: verbalized understanding, returned demonstration, verbal cues required, tactile cues required, and needs further education  HOME EXERCISE PROGRAM: Access Code: [email or web address] URL: [email or web address] Date: 01/14/2023 Prepared by: Margretta Ditty  Exercises - Supine Piriformis Stretch with Leg Straight  - 2 x daily - 7 x weekly - 1 sets - 3 reps - 30 sec  hold - Hooklying Hamstring Stretch with Strap  - 2 x daily - 7 x weekly - 1 sets - 3 reps - 30 sec  hold - Supine ITB Stretch with Strap  - 2 x daily - 7 x weekly - 1 sets - 3 reps -  30 sec  hold - Gastroc Stretch on Wall  - 2 x daily - 7 x weekly - 1 sets - 3 reps - 30 sec  hold - Soleus Stretch on Wall  - 2 x daily - 7 x weekly - 1 sets - 3 reps - 30 sec  hold - Supine Quadriceps Stretch with Strap on Table  - 2 x daily - 7 x weekly - 1 sets - 3 reps - 30-60 sec hold - Standing Eccentric Heel Raise  - 1 x daily - 7 x weekly - 3 sets - 10 reps - 3 sec hold - Single Leg Stance  - 1 x daily - 7 x weekly - 2 sets - 3-5 reps - 20 sec hold - The Diver  - 1 x daily - 7 x weekly - 1 sets - 10 reps - 2-3 sec  hold - Towel Scrunches  - 1 x daily - 7 x weekly - 1 sets - 3 reps - 30 sec  hold - Toe Yoga - Alternating Great Toe and Lesser Toe Extension  - 1 x daily - 7 x weekly - 1 sets - 10 reps - 3 sec  hold - Arch Lifting  - 1 x daily - 7 x weekly - 1 sets - 10-15 reps - 2-3 sec  hold - sidelying low back stretch  - 1 x daily - 7 x weekly - 1 sets - 3 reps - 30 seconds hold  Access Code: [email or web address] BACK URL: [email or web address] Date: 01/23/2023 Prepared by: Letitia Libra  Exercises - Supine Lower Trunk Rotation  - 1 x daily - 7 x weekly - 2 sets - 10 reps - Cat Cow  - 1 x daily - 7 x weekly - 2 sets - 10 reps - Sidelying Thoracic Rotation with Open Book  - 1 x daily - 7 x weekly - 2 sets - 10 reps - Standing Quadratus Lumborum Stretch with Doorway  - 1 x daily - 7 x weekly - 3 sets - 30 sec hold - Supine Pelvic Tilt  - 1 x daily - 7 x weekly - 2 sets - 10 reps - Seated 3 Way Exercise Ball Roll Out Stretch  - 1 x daily - 7 x weekly - 2 sets - 10 reps   ASSESSMENT:  CLINICAL IMPRESSION:  Re-evaluation completed as patient with updated referral for acute  right-sided low back pain without sciatica. She reports initial onset of pain while playing tennis a couple months ago, but muscle spasm began in her back about a week ago. Overall her pain has improved, but has not returned to her previous level of activity prior to this acute flare up. She is noted to have limited lateral trunk flexion and rotation AROM with noted tightness/pulling with these movements. She has palpable tenderness about Rt lumbar musculature and postural abnormalities. HEP was issued to include trunk mobility She will benefit from continued skilled PT to address the above stated deficits in order to optimize her function and assist in overall pain reduction.    Eval: Patient is a 52 y.o. female who was seen today for physical therapy evaluation and treatment for L leg pain. She reports initial pain in the L knee and into L calf area at least 7 months ago. She had PT for the knee and dry needling for the calf with some improvement but then experienced pain in the L calf again beginning in June with symptoms increasing in the past month. Patient has  no know injury. She does have a history of DVT's in 2003 and 2021. [place] 9/24 negative for DVTs. Patient presents with abnormal gait pattern; muscular tightness to palpation; decreased ROM; weakness in L > R hips; knees and ankles. Patient will benefit from comprehensive PT program to address LE symptoms.   OBJECTIVE IMPAIRMENTS: Abnormal gait, decreased activity tolerance, decreased ROM, decreased strength, hypomobility, impaired flexibility, improper body mechanics, postural dysfunction, obesity, and pain.    GOALS: Goals reviewed with patient? Yes  SHORT TERM GOALS: Target date: 01/29/2023  Independent in initial HEP  Baseline: Goal status: MET  2.  Increase AROM L bilat ankle DF by 3-5 degrees  Baseline:  Goal status: MET  LONG TERM GOALS: Target date: 03/21/23  Decrease pain L calf by 75-100% allowing patient to return to all  normal functional activities  Baseline:  Goal status: INITIAL  2.  5/5 strength bilat LE's  Baseline:  Goal status: INITIAL  3.  Ambulation for 20-30 min with pain no greater than 1-2/10 and no limp  Baseline:  Goal status: INITIAL  4.  Patient returns to full functional and recreational activities including return to tennis  Baseline:  Goal status: INITIAL  5.  Independent in HEP, including aquatic therapy as indicated  Baseline:  Goal status: INITIAL  6.  Improve functional limitation 60  Baseline: 48 Goal status: INITIAL  6. Patient will demonstrate full and pain free trunk AROM to improve ability to complete reaching and bending activity.  Baseline: see above Goal status: NEW  PLAN:  PT FREQUENCY: 2x/week  PT DURATION: 8 weeks  PLANNED INTERVENTIONS: 97164- PT Re-evaluation, 97110-Therapeutic exercises, 97530- Therapeutic activity, [id number]- Neuromuscular re-education, 97535- Self Care, 16109- Manual therapy, [id number]- Gait training, [id number]- Aquatic Therapy, 97014- Electrical stimulation (unattended), [id number]- Electrical stimulation (manual), [id number]- Vasopneumatic device, [id number]- Traction (mechanical), [id number]- Ionotophoresis 4mg /ml Dexamethasone, Taping, Dry Needling, Cryotherapy, and Moist heat.  PLAN FOR NEXT SESSION:Add hip flexor stretch (sits at desk/computer all day); add hip abduction and extension strengthening; DN and STM/IASTM for calf L/R; Progress eccentric strengthening. Trunk mobility; core strengthening   Letitia Libra, PT, DPT, ATC 01/23/23 11:58 AM

## 2023-01-23 ENCOUNTER — Ambulatory Visit: Payer: BC Managed Care – PPO

## 2023-01-23 DIAGNOSIS — G8929 Other chronic pain: Secondary | ICD-10-CM | POA: Diagnosis not present

## 2023-01-23 DIAGNOSIS — M6281 Muscle weakness (generalized): Secondary | ICD-10-CM

## 2023-01-23 DIAGNOSIS — M5459 Other low back pain: Secondary | ICD-10-CM | POA: Diagnosis not present

## 2023-01-23 DIAGNOSIS — M79662 Pain in left lower leg: Secondary | ICD-10-CM

## 2023-01-23 DIAGNOSIS — R2689 Other abnormalities of gait and mobility: Secondary | ICD-10-CM

## 2023-01-23 DIAGNOSIS — R29898 Other symptoms and signs involving the musculoskeletal system: Secondary | ICD-10-CM

## 2023-01-23 DIAGNOSIS — M25521 Pain in right elbow: Secondary | ICD-10-CM | POA: Diagnosis not present

## 2023-01-27 ENCOUNTER — Ambulatory Visit: Payer: BC Managed Care – PPO | Admitting: Rehabilitative and Restorative Service Providers"

## 2023-01-27 ENCOUNTER — Encounter: Payer: Self-pay | Admitting: Rehabilitative and Restorative Service Providers"

## 2023-01-27 DIAGNOSIS — R2689 Other abnormalities of gait and mobility: Secondary | ICD-10-CM | POA: Diagnosis not present

## 2023-01-27 DIAGNOSIS — R29898 Other symptoms and signs involving the musculoskeletal system: Secondary | ICD-10-CM

## 2023-01-27 DIAGNOSIS — M79662 Pain in left lower leg: Secondary | ICD-10-CM | POA: Diagnosis not present

## 2023-01-27 DIAGNOSIS — M25521 Pain in right elbow: Secondary | ICD-10-CM | POA: Diagnosis not present

## 2023-01-27 DIAGNOSIS — M6281 Muscle weakness (generalized): Secondary | ICD-10-CM

## 2023-01-27 DIAGNOSIS — M5459 Other low back pain: Secondary | ICD-10-CM

## 2023-01-27 DIAGNOSIS — G8929 Other chronic pain: Secondary | ICD-10-CM | POA: Diagnosis not present

## 2023-01-27 NOTE — Therapy (Signed)
OUTPATIENT PHYSICAL THERAPY TREATMENT  RE-EVALUATION    Patient Name: Kristen Meyers MRN: [id number] DOB:[birth year], 52 y.o., female Today's Date: 01/27/2023  END OF SESSION:  PT End of Session - 01/27/23 1152     Visit Number 11    Number of Visits 26    Date for PT Re-Evaluation 03/21/23    Authorization Type [employer] copay 40    Authorization Time Period year - 20 remaing for year per TL    Authorization - Visit Number 11    Authorization - Number of Visits 60    PT Start Time 1150    PT Stop Time 1230    PT Time Calculation (min) 40 min    Activity Tolerance Patient tolerated treatment well             Past Medical History:  Diagnosis Date   Achilles tendonosis of left lower extremity    Acute DVT (deep venous thrombosis) (HCC)    DVT 2012-january, L leg, in the context of post-C section. Again 2021, L leg, dx'd shortly after having covid infection->took eliquis x 29mo.   Cholelithiasis without obstruction 09/19/09 (u/s)   Chronic elbow pain, right    Dr. Karie Schwalbe, MRI 2024->tendonopathy/partial tear of distal biceps tendon, thinning or partial tear of the lateral ulnar collateral ligament, spurring of the radial head and capitellum.   COVID-19 virus infection    approx 11/09/19---got antibody infusion   Diabetes mellitus without complication (HCC) 03/21/2010   Elevated transaminase level 07/19/2012   Essential hypertension 03/21/2010   Qualifier: Diagnosis of   By: Gabriel Rung LPN, Enid Baas liver 09/19/09 (u/s)   Mildly elevated transaminases   GERD 03/21/2010   History of adenomatous polyp of colon 12/2018   2020.  Normal 01/14/22. 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  01/14/22 no polyps. Recall 5 yrs.   Coronary calcium score  10/2022   Score 26, 90th%'tile   LAPAROSCOPIC HYSTERECTOMY  02/2014   Ovaries remain (tubes out): done for DUB   TRANSTHORACIC ECHOCARDIOGRAM     11/2022 EF 55-60%, grd I DD, AV sclerosis w/out stenosis.   TUBAL LIGATION  2011   Dr. Malachy Mood is her GYN.   WISDOM TOOTH EXTRACTION     Patient Active Problem List   Diagnosis Date Noted   Chronic elbow pain, right 12/11/2022   Left leg pain 12/11/2022   Chest pain of uncertain etiology 10/24/2022   Cardiac murmur 10/24/2022   Acute DVT (deep venous thrombosis) (HCC)    Cholelithiasis without obstruction    COVID-19 virus infection    Fatty liver    Obesity, Class II, BMI 35-39.9    Seizures (HCC)    History of laparoscopic-assisted vaginal hysterectomy  02/14/2020   Deep venous thrombosis (HCC) 02/14/2020   Subclinical hypothyroidism 2021   Melanocytic nevi, unspecified 12/30/2018   History of adenomatous polyp of colon 12/2018   Diabetes mellitus without complication (HCC) 06/22/2015   Health maintenance examination 07/20/2013   NAFLD (nonalcoholic fatty liver disease) 40/98/1191   Obesity, morbid (HCC) 10/19/2012   Paresthesia of left arm 09/30/2012   Low back pain 09/30/2012   Left shoulder pain 09/30/2012   Type II or unspecified type diabetes mellitus without mention of complication, uncontrolled 07/19/2012   Elevated transaminase level 07/19/2012   History of herpes zoster 03/2012   Hyperlipidemia 03/21/2010   Essential hypertension 03/21/2010   GERD 03/21/2010    PCP: Dr Earley Favor  REFERRING PROVIDER:  Dr Thornell Mule, Maryjean Morn, MD   REFERRING DIAG: L leg pain (Dr. Benjamin Stain)   M54.50 (ICD-10-CM) - Acute right-sided low back pain without sciatica (Dr. Milinda Cave)   THERAPY DIAG:  Pain of left calf  Other symptoms and signs involving the musculoskeletal system  Muscle weakness (generalized)  Other abnormalities of gait and mobility  Other low back pain  Rationale for Evaluation and Treatment: Rehabilitation  ONSET DATE: 08/16/22  SUBJECTIVE:   SUBJECTIVE STATEMENT: Patient reports that she decorated her house for Christmas over the weekend and notices some increased soreness in the L heel. She took it easy yesterday and it does feel better than it did yesterday. Her back is feeling good. No pain in the back and she is doing her exercises for back. Wants to progress to return to tennis as leg allows.   BACK EVAL: Patient with new referral for her back. Patient had a knot in the back for a couple months and aggravated it with weight training and then it began to spasm while in PT about a week ago. She does recall swinging her tennis racquet and felt a twinge in the back as her initial cause of pain. She kept playing when this occurred in September. She has noticed the knot before and spasms previously, but not to the extent of her recent pain. Patient reports her back is feeling better since last week. She did not have to take any muscle relaxers yesterday and did a mild workout without issues. Continues to take anti-inflammatories. No red flag symptoms. She has still not returned to tennis.    Eval: Patient reports that she has had L leg pain for several months with no known injury. Symptoms have increased in the past month. She has pain and inflammation in the L leg on constant basis now. Pain was intermittent prior to the past month. Had pain in the L knee 3/24 "floating knee cap", then L calf pain. Went to PT with some improvement in the knee with exercise and DN but she stated  having pain again 6/24  PERTINENT HISTORY: AODM; HTN; elevated cholesterol; knee pain bilat; R elbow pain for ~ past 6-7 months did PT for tennis elbow with some improvement then symptoms started getting worse; history ot L LE DVT's 2003 and 2021  PAIN:  Are you having pain? Yes: NPRS scale: 2/10 Pain location: Lt heel  Pain description: tightness; tingling  Aggravating factors: tennis; driving; standing; walking; sitting at desk or upright position Relieving factors: recliner; OTC meds; ice  PAIN:  Are you having pain? Yes: NPRS scale: 0 currently; at worst 10/10 Pain location: right low back Pain description: spasm Aggravating factors: unsure, movement Relieving factors: heat, TENS , medication, stretching   PRECAUTIONS:  Other: history ot L LE DVT's 2003 and 2021 - [place] negative for DVT's 12/11/22  WEIGHT BEARING RESTRICTIONS: No  FALLS:  Has patient fallen in last 6 months? 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  DIAGNOSTIC FINDINGS: 12/11/22: Venous Img Lower L leg; Directed duplex of the left lower extremity negative for DVT   MUSCLE LENGTH: Hamstrings: Right 65 deg; Left 60 deg Thomas test:not tested - tight hip flexors to palpation   POSTURE: rounded shoulders, forward head, flexed trunk , and weight shift right  PALPATION: Tenderness and tightness to palpation L > R hip flexors; posterior hip; gastroc-soleus complex   01/23/23: Rt ASIS and iliac crest elevated in standing; TTP Rt lumbar paraspinals, QL   LOWER EXTREMITY ROM: tight L > R hip Active ROM Right eval Left eval 01/23/23  Hip flexion     Hip extension     Hip abduction     Hip adduction      Hip internal rotation     Hip external rotation     Knee flexion     Knee extension     Ankle dorsiflexion 9 5 Lt: 10; Rt: 14  Ankle plantarflexion     Ankle inversion 38 35   Ankle eversion 28 21 pain anterior leg     (Blank rows = not tested)  LOWER EXTREMITY MMT: MMT Right eval Left eval Right 01/01/23 Left 01/01/23 01/23/23   Hip flexion 4+ 4+ 4+ 4+ 5 bilateral   Hip extension 3+ 3+ 5 5 5  bilateral   Hip abduction 4 4- 5 4+ Lt: 4+; Rt: 4  Hip adduction   5 4   Hip internal rotation       Hip external rotation       Knee flexion 5 5     Knee extension 5 4- 5 5   Ankle dorsiflexion 4 4- 5 5   Ankle plantarflexion       Ankle inversion 5 5-     Ankle eversion 5 5-      (Blank rows = not tested) LUMBAR ROM:   Active  A/PROM  01/23/23  Flexion Full   Extension 25% limited   Right lateral flexion 25% limited pull  Left lateral flexion 25% limited pull   Right rotation 25% limited tight  Left rotation 25% limited tight    (Blank rows = not tested)  FUNCTIONAL TESTS:  5 times sit to stand: 10.41 sec use of UE's for momentum   GAIT: Distance walked: 40 feet  Assistive device utilized: None Level of assistance: Complete Independence Comments: decreased wt bearing with notable limp with stance to toe off on L   [facility] Adult PT Treatment:                                                DATE: 01/27/23 Therapeutic Exercise: Recumbent bike L2 x 5 min  Standing Walking laps in gym Slant board 30 seconds x 2 reps bilat Gastroc stretch 30 sec x 2 L Soleus stretch 30 sec x 2  L SLS x 20 seconds R and L Wall squat x 20 sec x 3  Prone  hip extension with cues to reduce knee flexion x 10 reps R and L Manual Therapy: STM L gastroc, L soleous, plantar surface of foot PROM great toe extension Joint moiblization L talus and calcaneous    [facility] Adult PT Treatment:                                                DATE: 01/23/23 Therapeutic Exercise: LTR x 1 minute Open book x  10 each  Pelvic tilts x 10  3 way physioball rollout x 1 minute  Cat cow x 10  Updated HEP  Manual Therapy: Muscle energy technique to improve pelvic alignment   Self Care: Gym activities to avoid currently including rotational and excessive trunk flexion  [facility] Adult PT Treatment:                                                DATE: 01/19/23 Therapeutic Exercise: Treadmill x 2.2 mph x 6 minutes with bilat UE support Standing Walking laps in gym Slant board 20 seconds x 2 reps bilat SLS x 20 seconds R and L Sitting  Lateral trunk flexion to L 20 sec x 3  Supine Double knee to chest 20 sec x 5  Prone  hip extension with cues to reduce knee flexion x 10 reps R and L Manual Therapy: STM L gastroc, L soleous, plantar surface of foot PROM great toe extension Joint moiblization L talus and calcaneous   Modalities: TENS and moist hot pack x 10 minutes R QL region   [facility] Adult PT Treatment:                                                DATE: 01/14/23 Therapeutic Exercise: Treadmill x 2.2 mph x 5 minutes with bilat UE support Standing Heel cord stretch off back of step Runner's step up x 10 reps R and L  Slant board 20 seconds x 2 reps bilat SLS x 20 seconds R and L SLS on foam cushion x 20 seconds R and L With foot on 12" surface anteriorly loading moving knee over toe -- some L knee pain Supine HS stretch R and L x 5 reps x 20 seconds Prone  hip extension with cues to reduce knee flexion x 10 reps R and L Sidelying Arc abduction x 10 reps R and L Manual Therapy: STM L gastroc, L soleous, plantar surface of foot PROM great toe extension Joint moiblization anterior drawer grade II Lateral drawer grade II Modalities: TENS and moist hot pack x 10 minutes R QL region   [facility] Adult PT Treatment:                                                DATE: 01/08/23 Therapeutic Exercise: Nustep L6 x 6 min Incline board 30 sec x 3 knees straight, 30 sec  x 3 knees bent  Runners  step up 6 inch step x 20 R/L  SLS 20 sec x 3 L/R  Diver touch to chair x 10 L/R 2 sets on L  Heel raise bilat lowering with L eccentrically x 10  Heel raise in sitting to isolate soleus x 8; repeated with 15# KB x 10  Towel crunch and towel curl  Toe yoga Foot arch Standing stagger stance wt shift  Manual Therapy: STM/IASTM to L gastroc/soleus     PATIENT EDUCATION:  Education details: POC; HEP; assessment findings  Person educated: Patient Education method: Explanation, Demonstration, Tactile cues, Verbal cues, and Handouts Education comprehension: verbalized understanding, returned demonstration, verbal cues required, tactile cues required, and needs further education  HOME EXERCISE PROGRAM: Access Code: [email or web address] URL: [email or web address] Date: 01/14/2023 Prepared by: Margretta Ditty  Exercises - Supine Piriformis Stretch with Leg Straight  - 2 x daily - 7 x weekly - 1 sets - 3 reps - 30 sec  hold - Hooklying Hamstring Stretch with Strap  - 2 x daily - 7 x weekly - 1 sets - 3 reps - 30 sec  hold - Supine ITB Stretch with Strap  - 2 x daily - 7 x weekly - 1 sets - 3 reps - 30 sec  hold - Gastroc Stretch on Wall  - 2 x daily - 7 x weekly - 1 sets - 3 reps - 30 sec  hold - Soleus Stretch on Wall  - 2 x daily - 7 x weekly - 1 sets - 3 reps - 30 sec  hold - Supine Quadriceps Stretch with Strap on Table  - 2 x daily - 7 x weekly - 1 sets - 3 reps - 30-60 sec hold - Standing Eccentric Heel Raise  - 1 x daily - 7 x weekly - 3 sets - 10 reps - 3 sec hold - Single Leg Stance  - 1 x daily - 7 x weekly - 2 sets - 3-5 reps - 20 sec hold - The Diver  - 1 x daily - 7 x weekly - 1 sets - 10 reps - 2-3 sec  hold - Towel Scrunches  - 1 x daily - 7 x weekly - 1 sets - 3 reps - 30 sec  hold - Toe Yoga - Alternating Great Toe and Lesser Toe Extension  - 1 x daily - 7 x weekly - 1 sets - 10 reps - 3 sec  hold - Arch Lifting  - 1 x daily - 7 x weekly - 1 sets - 10-15 reps - 2-3 sec   hold - sidelying low back stretch  - 1 x daily - 7 x weekly - 1 sets - 3 reps - 30 seconds hold  Access Code: [email or web address] BACK URL: [email or web address] Date: 01/23/2023 Prepared by: Letitia Libra  Exercises - Supine Lower Trunk Rotation  - 1 x daily - 7 x weekly - 2 sets - 10 reps - Cat Cow  - 1 x daily - 7 x weekly - 2 sets - 10 reps - Sidelying Thoracic Rotation with Open Book  - 1 x daily - 7 x weekly - 2 sets - 10 reps - Standing Quadratus Lumborum Stretch with Doorway  - 1 x daily - 7 x weekly - 3 sets - 30 sec hold - Supine Pelvic Tilt  - 1 x daily - 7 x weekly - 2 sets - 10 reps - Seated 3 Way Exercise [employer] Stretch  - 1  x daily - 7 x weekly - 2 sets - 10 reps   ASSESSMENT:  CLINICAL IMPRESSION: Patient returns with increased soreness and pain in the L calf from climbing stairs and a step ladder multiple ties over the weekend while decorating her home for Christmas. She is having no back pain and is continuing with her back exercises at home without difficulty. Noted increased muscular tightness in the L gastroc/soleus complex. Addressed with gentle stretching and manual work. Decreased tenderness to palpation following treatment.    Re-evaluation completed as patient with updated referral for acute right-sided low back pain without sciatica. She reports initial onset of pain while playing tennis a couple months ago, but muscle spasm began in her back about a week ago. Overall her pain has improved, but has not returned to her previous level of activity prior to this acute flare up. She is noted to have limited lateral trunk flexion and rotation AROM with noted tightness/pulling with these movements. She has palpable tenderness about Rt lumbar musculature and postural abnormalities. HEP was issued to include trunk mobility She will benefit from continued skilled PT to address the above stated deficits in order to optimize her function and assist in overall pain  reduction.    Eval: Patient is a 52 y.o. female who was seen today for physical therapy evaluation and treatment for L leg pain. She reports initial pain in the L knee and into L calf area at least 7 months ago. She had PT for the knee and dry needling for the calf with some improvement but then experienced pain in the L calf again beginning in June with symptoms increasing in the past month. Patient has no know injury. She does have a history of DVT's in 2003 and 2021. [place] 9/24 negative for DVTs. Patient presents with abnormal gait pattern; muscular tightness to palpation; decreased ROM; weakness in L > R hips; knees and ankles. Patient will benefit from comprehensive PT program to address LE symptoms.   OBJECTIVE IMPAIRMENTS: Abnormal gait, decreased activity tolerance, decreased ROM, decreased strength, hypomobility, impaired flexibility, improper body mechanics, postural dysfunction, obesity, and pain.    GOALS: Goals reviewed with patient? Yes  SHORT TERM GOALS: Target date: 01/29/2023  Independent in initial HEP  Baseline: Goal status: MET  2.  Increase AROM L bilat ankle DF by 3-5 degrees  Baseline:  Goal status: MET  LONG TERM GOALS: Target date: 03/21/23  Decrease pain L calf by 75-100% allowing patient to return to all normal functional activities  Baseline:  Goal status: INITIAL  2.  5/5 strength bilat LE's  Baseline:  Goal status: INITIAL  3.  Ambulation for 20-30 min with pain no greater than 1-2/10 and no limp  Baseline:  Goal status: INITIAL  4.  Patient returns to full functional and recreational activities including return to tennis  Baseline:  Goal status: INITIAL  5.  Independent in HEP, including aquatic therapy as indicated  Baseline:  Goal status: INITIAL  6.  Improve functional limitation 60  Baseline: 48 Goal status: INITIAL  6. Patient will demonstrate full and pain free trunk AROM to improve ability to complete reaching and bending activity.   Baseline: see above Goal status: NEW  PLAN:  PT FREQUENCY: 2x/week  PT DURATION: 8 weeks  PLANNED INTERVENTIONS: 97164- PT Re-evaluation, 97110-Therapeutic exercises, 97530- Therapeutic activity, [id number]- Neuromuscular re-education, 97535- Self Care, [id number]- Manual therapy, [id number]- Gait training, [id number]- Aquatic Therapy, 97014- Electrical stimulation (unattended), [id number]- Electrical stimulation (manual), [id number]- Vasopneumatic device, [id number]-  Traction (mechanical), 21308- Ionotophoresis 4mg /ml Dexamethasone, Taping, Dry Needling, Cryotherapy, and Moist heat.  PLAN FOR NEXT SESSION: Add hip flexor stretch and pec stretch (sits at desk/computer all day); add hip abduction and extension strengthening - lateral movement in preparation for return to tennis; DN and STM/IASTM for calf L/R as needed; Progress eccentric strengthening. Trunk mobility; core strengthening   Peytin Dechert P. Leonor Liv PT, MPH 01/27/23 11:54 AM

## 2023-01-30 ENCOUNTER — Ambulatory Visit: Payer: BC Managed Care – PPO

## 2023-01-30 DIAGNOSIS — R2689 Other abnormalities of gait and mobility: Secondary | ICD-10-CM | POA: Diagnosis not present

## 2023-01-30 DIAGNOSIS — M5459 Other low back pain: Secondary | ICD-10-CM | POA: Diagnosis not present

## 2023-01-30 DIAGNOSIS — M6281 Muscle weakness (generalized): Secondary | ICD-10-CM

## 2023-01-30 DIAGNOSIS — R29898 Other symptoms and signs involving the musculoskeletal system: Secondary | ICD-10-CM | POA: Diagnosis not present

## 2023-01-30 DIAGNOSIS — M79662 Pain in left lower leg: Secondary | ICD-10-CM | POA: Diagnosis not present

## 2023-01-30 DIAGNOSIS — G8929 Other chronic pain: Secondary | ICD-10-CM | POA: Diagnosis not present

## 2023-01-30 DIAGNOSIS — M25521 Pain in right elbow: Secondary | ICD-10-CM | POA: Diagnosis not present

## 2023-01-30 NOTE — Therapy (Signed)
OUTPATIENT PHYSICAL THERAPY TREATMENT    Patient Name: Kristen Meyers MRN: [id number] DOB:06/26/1970, 52 y.o., female Today's Date: 01/30/2023  END OF SESSION:  PT End of Session - 01/30/23 1101     Visit Number 12    Number of Visits 26    Date for PT Re-Evaluation 03/21/23    Authorization Type [employer] copay 40    Authorization Time Period year - 33 remaing for year per TL    Authorization - Visit Number 12    Authorization - Number of Visits 60    PT Start Time 1101    PT Stop Time 1143    PT Time Calculation (min) 42 min    Activity Tolerance Patient tolerated treatment well              Past Medical History:  Diagnosis Date   Achilles tendonosis of left lower extremity    Acute DVT (deep venous thrombosis) (HCC)    DVT 2012-january, L leg, in the context of post-C section. Again 2021, L leg, dx'd shortly after having covid infection->took eliquis x 43mo.   Cholelithiasis without obstruction 09/19/09 (u/s)   Chronic elbow pain, right    Dr. Karie Schwalbe, MRI 2024->tendonopathy/partial tear of distal biceps tendon, thinning or partial tear of the lateral ulnar collateral ligament, spurring of the radial head and capitellum.   COVID-19 virus infection    approx 11/09/19---got antibody infusion   Diabetes mellitus without complication (HCC) 03/21/2010   Elevated transaminase level 07/19/2012   Essential hypertension 03/21/2010   Qualifier: Diagnosis of   By: Gabriel Rung LPN, Enid Baas liver 09/19/09 (u/s)   Mildly elevated transaminases   GERD 03/21/2010   History of adenomatous polyp of colon 12/2018   2020.  Normal 01/14/22. 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  01/14/22 no polyps. Recall 5 yrs.   Coronary calcium score  10/2022   Score 26, 90th%'tile   LAPAROSCOPIC HYSTERECTOMY  02/2014   Ovaries remain (tubes out): done for DUB   TRANSTHORACIC ECHOCARDIOGRAM     11/2022 EF 55-60%, grd I DD, AV sclerosis w/out stenosis.   TUBAL LIGATION  2011   Dr. Malachy Mood is her GYN.   WISDOM TOOTH EXTRACTION     Patient Active Problem List   Diagnosis Date Noted   Chronic elbow pain, right 12/11/2022   Left leg pain 12/11/2022   Chest pain of uncertain etiology 10/24/2022   Cardiac murmur 10/24/2022   Acute DVT (deep venous thrombosis) (HCC)    Cholelithiasis without obstruction    COVID-19 virus infection    Fatty liver    Obesity, Class II, BMI 35-39.9    Seizures (HCC)    History of laparoscopic-assisted vaginal hysterectomy 02/14/2020  Deep venous thrombosis (HCC) 02/14/2020   Subclinical hypothyroidism 2021   Melanocytic nevi, unspecified 12/30/2018   History of adenomatous polyp of colon 12/2018   Diabetes mellitus without complication (HCC) 06/22/2015   Health maintenance examination 07/20/2013   NAFLD (nonalcoholic fatty liver disease) 02/72/5366   Obesity, morbid (HCC) 10/19/2012   Paresthesia of left arm 09/30/2012   Low back pain 09/30/2012   Left shoulder pain 09/30/2012   Type II or unspecified type diabetes mellitus without mention of complication, uncontrolled 07/19/2012   Elevated transaminase level 07/19/2012   History of herpes zoster 03/2012   Hyperlipidemia 03/21/2010   Essential hypertension 03/21/2010   GERD 03/21/2010    PCP: Dr Earley Favor  REFERRING PROVIDER:  Dr Thornell Mule, Maryjean Morn, MD   REFERRING DIAG: L leg pain (Dr. Benjamin Stain)   M54.50 (ICD-10-CM) - Acute right-sided low back pain without sciatica (Dr. Milinda Cave)   THERAPY DIAG:  Pain of left calf  Other symptoms and signs involving the musculoskeletal system  Muscle weakness (generalized)  Other abnormalities of gait and mobility  Other low back pain  Rationale for Evaluation and Treatment: Rehabilitation  ONSET DATE: 08/16/22  SUBJECTIVE:   SUBJECTIVE STATEMENT: "Feeling good. A little tight in the Achilles."  BACK EVAL: Patient with new referral for her back. Patient had a knot in the back for a couple months and aggravated it with weight training and then it began to spasm while in PT about a week ago. She does recall swinging her tennis racquet and felt a twinge in the back as her initial cause of pain. She kept playing when this occurred in September. She has noticed the knot before and spasms previously, but not to the extent of her recent pain. Patient reports her back is feeling better since last week. She did not have to take any muscle relaxers yesterday and did a mild workout without issues. Continues to take anti-inflammatories. No red flag symptoms. She has still not returned to tennis.    Eval: Patient reports that she has had L leg pain for several months with no known injury. Symptoms have increased in the past month. She has pain and inflammation in the L leg on constant basis now. Pain was intermittent prior to the past month. Had pain in the L knee 3/24 "floating knee cap", then L calf pain. Went to PT with some improvement in the knee with exercise and DN but she stated having pain again 6/24  PERTINENT HISTORY: AODM; HTN; elevated cholesterol; knee pain bilat; R elbow pain for ~ past 6-7 months did PT for tennis elbow with some improvement then symptoms started getting worse; history ot L LE DVT's 2003 and 2021  PAIN:  Are you having pain? Yes: NPRS scale: 1/10 Pain location: Lt  achilles  Pain description: tightness Aggravating factors: tennis; driving; standing; walking; sitting at desk or upright position Relieving factors: recliner; OTC meds; ice  PAIN:  Are you having pain? Yes: NPRS scale: 0 currently; at worst 10/10 Pain location: right low back Pain description: spasm Aggravating factors: unsure, movement Relieving factors: heat, TENS , medication, stretching   PRECAUTIONS: Other: history ot L LE DVT's 2003 and 2021 - [place] negative for DVT's 12/11/22  WEIGHT BEARING RESTRICTIONS: No  FALLS:  Has patient fallen in last 6 months? 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  DIAGNOSTIC FINDINGS: 12/11/22: Venous Img Lower L leg; Directed duplex of the left lower extremity negative for DVT   MUSCLE LENGTH: Hamstrings: Right 65 deg; Left 60 deg Thomas test:not tested - tight hip flexors to palpation   POSTURE: rounded shoulders, forward head, flexed trunk , and weight shift right  PALPATION: Tenderness and tightness to palpation L > R hip flexors; posterior hip; gastroc-soleus complex   01/23/23: Rt ASIS and iliac crest elevated in standing; TTP Rt lumbar paraspinals, QL   LOWER EXTREMITY ROM: tight L > R hip Active ROM Right eval Left eval 01/23/23 01/30/23  Hip flexion      Hip extension      Hip abduction      Hip adduction      Hip internal rotation      Hip external rotation      Knee flexion      Knee extension      Ankle dorsiflexion 9 5 Lt: 10; Rt: 14   Ankle plantarflexion      Ankle inversion 38 35    Ankle eversion 28 21 pain anterior leg     Great toe passive extension     Lt: 55; Rt: 70    (Blank rows = not  tested)  LOWER EXTREMITY MMT: MMT Right eval Left eval Right 01/01/23 Left 01/01/23 01/23/23   Hip flexion 4+ 4+ 4+ 4+ 5 bilateral   Hip extension 3+ 3+ 5 5 5  bilateral   Hip abduction 4 4- 5 4+ Lt: 4+; Rt: 4  Hip adduction   5 4   Hip internal rotation       Hip external rotation       Knee flexion 5 5     Knee extension 5 4- 5 5   Ankle dorsiflexion 4 4- 5 5   Ankle plantarflexion       Ankle inversion 5 5-     Ankle eversion 5 5-      (Blank rows = not tested) LUMBAR ROM:   Active  A/PROM  01/23/23  Flexion Full   Extension 25% limited   Right lateral flexion 25% limited pull  Left lateral flexion 25% limited pull   Right rotation 25% limited tight  Left rotation 25% limited tight    (Blank rows = not tested)  FUNCTIONAL TESTS:  5 times sit to stand: 10.41 sec use of UE's for momentum   GAIT: Distance walked: 40 feet  Assistive device utilized: None Level of assistance: Complete Independence Comments: decreased wt bearing with notable limp with stance to toe off on L  [facility] Adult PT Treatment:                                                DATE: 01/30/23 Therapeutic Exercise: Recumbent bike level 2 x 5 minutes Gastroc/solues stretch on wedge x 1 minute each  Great toe flexor stretch x 30 sec DL calf raise focusing on full range 2 x 10  Seated marble pickups 2 x 10  HEP update  Manual Therapy: Skilled palpation of trigger points Trigger Point Dry Needling Treatment: Pre-treatment instruction: Patient instructed on dry needling rationale, procedures, and possible side  effects including pain during treatment (achy,cramping feeling), bruising, drop of blood, lightheadedness, nausea, sweating. Patient Consent Given: Yes Education handout provided: Previously provided Muscles treated: Lt gastroc/soleus   Treatment response/outcome: Twitch response elicited and Palpable decrease in muscle tension Post-treatment instructions: Patient instructed to expect possible  mild to moderate muscle soreness later today and/or tomorrow. Patient instructed in methods to reduce muscle soreness and to continue prescribed HEP. If patient was dry needled over the lung field, patient was instructed on signs and symptoms of pneumothorax and, however unlikely, to see immediate medical attention should they occur. Patient was also educated on signs and symptoms of infection and to seek medical attention should they occur. Patient verbalized understanding of these instructions and education.  Gait training: Focusing on push-off during gait cycle x 40 ft    [facility] Adult PT Treatment:                                                DATE: 01/27/23 Therapeutic Exercise: Recumbent bike L2 x 5 min  Standing Walking laps in gym Slant board 30 seconds x 2 reps bilat Gastroc stretch 30 sec x 2 L Soleus stretch 30 sec x 2 L SLS x 20 seconds R and L Wall squat x 20 sec x 3  Prone  hip extension with cues to reduce knee flexion x 10 reps R and L Manual Therapy: STM L gastroc, L soleous, plantar surface of foot PROM great toe extension Joint moiblization L talus and calcaneous    [facility] Adult PT Treatment:                                                DATE: 01/23/23 Therapeutic Exercise: LTR x 1 minute Open book x 10 each  Pelvic tilts x 10  3 way physioball rollout x 1 minute  Cat cow x 10  Updated HEP  Manual Therapy: Muscle energy technique to improve pelvic alignment   Self Care: Gym activities to avoid currently including rotational and excessive trunk flexion  [facility] Adult PT Treatment:                                                DATE: 01/19/23 Therapeutic Exercise: Treadmill x 2.2 mph x 6 minutes with bilat UE support Standing Walking laps in gym Slant board 20 seconds x 2 reps bilat SLS x 20 seconds R and L Sitting  Lateral trunk flexion to L 20 sec x 3  Supine Double knee to chest 20 sec x 5  Prone  hip extension with cues to reduce knee flexion x 10 reps R  and L Manual Therapy: STM L gastroc, L soleous, plantar surface of foot PROM great toe extension Joint moiblization L talus and calcaneous   Modalities: TENS and moist hot pack x 10 minutes R QL region   [facility] Adult PT Treatment:  DATE: 01/14/23 Therapeutic Exercise: Treadmill x 2.2 mph x 5 minutes with bilat UE support Standing Heel cord stretch off back of step Runner's step up x 10 reps R and L  Slant board 20 seconds x 2 reps bilat SLS x 20 seconds R and L SLS on foam cushion x 20 seconds R and L With foot on 12" surface anteriorly loading moving knee over toe -- some L knee pain Supine HS stretch R and L x 5 reps x 20 seconds Prone  hip extension with cues to reduce knee flexion x 10 reps R and L Sidelying Arc abduction x 10 reps R and L Manual Therapy: STM L gastroc, L soleous, plantar surface of foot PROM great toe extension Joint moiblization anterior drawer grade II Lateral drawer grade II Modalities: TENS and moist hot pack x 10 minutes R QL region   [facility] Adult PT Treatment:                                                DATE: 01/08/23 Therapeutic Exercise: Nustep L6 x 6 min Incline board 30 sec x 3 knees straight, 30 sec x 3 knees bent  Runners step up 6 inch step x 20 R/L  SLS 20 sec x 3 L/R  Diver touch to chair x 10 L/R 2 sets on L  Heel raise bilat lowering with L eccentrically x 10  Heel raise in sitting to isolate soleus x 8; repeated with 15# KB x 10  Towel crunch and towel curl  Toe yoga Foot arch Standing stagger stance wt shift  Manual Therapy: STM/IASTM to L gastroc/soleus     PATIENT EDUCATION:  Education details: HEP update Person educated: Patient Education method: Explanation, demo, cues, handout Education comprehension: verbalized understanding, returned demo, cues   HOME EXERCISE PROGRAM: Access Code: [email or web address] URL: [email or web address] Date: 01/30/2023 Prepared by:  Letitia Libra  Exercises - Supine Piriformis Stretch with Leg Straight  - 2 x daily - 7 x weekly - 1 sets - 3 reps - 30 sec  hold - Hooklying Hamstring Stretch with Strap  - 2 x daily - 7 x weekly - 1 sets - 3 reps - 30 sec  hold - Supine ITB Stretch with Strap  - 2 x daily - 7 x weekly - 1 sets - 3 reps - 30 sec  hold - Gastroc Stretch on Wall  - 2 x daily - 7 x weekly - 1 sets - 3 reps - 30 sec  hold - Soleus Stretch on Wall  - 2 x daily - 7 x weekly - 1 sets - 3 reps - 30 sec  hold - Supine Quadriceps Stretch with Strap on Table  - 2 x daily - 7 x weekly - 1 sets - 3 reps - 30-60 sec hold - Standing Eccentric Heel Raise  - 1 x daily - 7 x weekly - 3 sets - 10 reps - 3 sec hold - Single Leg Stance  - 1 x daily - 7 x weekly - 2 sets - 3-5 reps - 20 sec hold - The Diver  - 1 x daily - 7 x weekly - 1 sets - 10 reps - 2-3 sec  hold - Towel Scrunches  - 1 x daily - 7 x weekly - 1 sets - 3 reps - 30 sec  hold - Toe Yoga -  Alternating Great Toe and Lesser Toe Extension  - 1 x daily - 7 x weekly - 1 sets - 10 reps - 3 sec  hold - Arch Lifting  - 1 x daily - 7 x weekly - 1 sets - 10-15 reps - 2-3 sec  hold - sidelying low back stretch  - 1 x daily - 7 x weekly - 1 sets - 3 reps - 30 seconds hold - Seated Self Great Toe Stretch  - 1 x daily - 7 x weekly - 3 sets - 30 sec  hold  Access Code: [email or web address] URL: [email or web address] Date: 01/23/2023 Prepared by: Letitia Libra  Exercises - Supine Lower Trunk Rotation  - 1 x daily - 7 x weekly - 2 sets - 10 reps - Cat Cow  - 1 x daily - 7 x weekly - 2 sets - 10 reps - Sidelying Thoracic Rotation with Open Book  - 1 x daily - 7 x weekly - 2 sets - 10 reps - Standing Quadratus Lumborum Stretch with Doorway  - 1 x daily - 7 x weekly - 3 sets - 30 sec hold - Supine Pelvic Tilt  - 1 x daily - 7 x weekly - 2 sets - 10 reps - Seated 3 Way Exercise Ball Roll Out Stretch  - 1 x daily - 7 x weekly - 2 sets - 10 reps   ASSESSMENT:  CLINICAL  IMPRESSION: TPDN performed to Lt gastroc/soleus with excellent twitch response elicited. She is noted to have limited push-off during gait cycle on the LLE. Limited passive great toe extension on the LLE compared to the RLE, so added great toe stretching to her HEP. With calf raises focused on completing through full range with patient reporting mild discomfort in great toe through full range, but not pain in calf/achilles.    Re-evaluation completed as patient with updated referral for acute right-sided low back pain without sciatica. She reports initial onset of pain while playing tennis a couple months ago, but muscle spasm began in her back about a week ago. Overall her pain has improved, but has not returned to her previous level of activity prior to this acute flare up. She is noted to have limited lateral trunk flexion and rotation AROM with noted tightness/pulling with these movements. She has palpable tenderness about Rt lumbar musculature and postural abnormalities. HEP was issued to include trunk mobility She will benefit from continued skilled PT to address the above stated deficits in order to optimize her function and assist in overall pain reduction.    Eval: Patient is a 52 y.o. female who was seen today for physical therapy evaluation and treatment for L leg pain. She reports initial pain in the L knee and into L calf area at least 7 months ago. She had PT for the knee and dry needling for the calf with some improvement but then experienced pain in the L calf again beginning in June with symptoms increasing in the past month. Patient has no know injury. She does have a history of DVT's in 2003 and 2021. [place] 9/24 negative for DVTs. Patient presents with abnormal gait pattern; muscular tightness to palpation; decreased ROM; weakness in L > R hips; knees and ankles. Patient will benefit from comprehensive PT program to address LE symptoms.   OBJECTIVE IMPAIRMENTS: Abnormal gait, decreased  activity tolerance, decreased ROM, decreased strength, hypomobility, impaired flexibility, improper body mechanics, postural dysfunction, obesity, and pain.    GOALS: Goals reviewed with patient? Yes  SHORT TERM GOALS: Target date: 01/29/2023  Independent in initial HEP  Baseline: Goal status: MET  2.  Increase AROM L bilat ankle DF by 3-5 degrees  Baseline:  Goal status: MET  LONG TERM GOALS: Target date: 03/21/23  Decrease pain L calf by 75-100% allowing patient to return to all normal functional activities  Baseline:  Goal status: INITIAL  2.  5/5 strength bilat LE's  Baseline:  Goal status: INITIAL  3.  Ambulation for 20-30 min with pain no greater than 1-2/10 and no limp  Baseline:  Goal status: INITIAL  4.  Patient returns to full functional and recreational activities including return to tennis  Baseline:  Goal status: INITIAL  5.  Independent in HEP, including aquatic therapy as indicated  Baseline:  Goal status: INITIAL  6.  Improve functional limitation 60  Baseline: 48 Goal status: INITIAL  6. Patient will demonstrate full and pain free trunk AROM to improve ability to complete reaching and bending activity.  Baseline: see above Goal status: NEW  PLAN:  PT FREQUENCY: 2x/week  PT DURATION: 8 weeks  PLANNED INTERVENTIONS: 97164- PT Re-evaluation, 97110-Therapeutic exercises, 97530- Therapeutic activity, [id number]- Neuromuscular re-education, 97535- Self Care, 16109- Manual therapy, [id number]- Gait training, [id number]- Aquatic Therapy, 97014- Electrical stimulation (unattended), [id number]- Electrical stimulation (manual), [id number]- Vasopneumatic device, [id number]- Traction (mechanical), [id number]- Ionotophoresis 4mg /ml Dexamethasone, Taping, Dry Needling, Cryotherapy, and Moist heat.  PLAN FOR NEXT SESSION: Add hip flexor stretch and pec stretch (sits at desk/computer all day); add hip abduction and extension strengthening - lateral movement in preparation for return to tennis; DN  and STM/IASTM for calf L/R as needed; Progress eccentric and intrinsic foot strengthening. Trunk mobility; core strengthening    Letitia Libra, PT, DPT, ATC 01/30/23 11:50 AM

## 2023-02-02 ENCOUNTER — Ambulatory Visit: Payer: BC Managed Care – PPO

## 2023-02-02 ENCOUNTER — Encounter: Payer: Self-pay | Admitting: Sports Medicine

## 2023-02-02 DIAGNOSIS — R29898 Other symptoms and signs involving the musculoskeletal system: Secondary | ICD-10-CM

## 2023-02-02 DIAGNOSIS — G8929 Other chronic pain: Secondary | ICD-10-CM

## 2023-02-02 DIAGNOSIS — R2689 Other abnormalities of gait and mobility: Secondary | ICD-10-CM

## 2023-02-02 DIAGNOSIS — M6281 Muscle weakness (generalized): Secondary | ICD-10-CM | POA: Diagnosis not present

## 2023-02-02 DIAGNOSIS — M5459 Other low back pain: Secondary | ICD-10-CM | POA: Diagnosis not present

## 2023-02-02 DIAGNOSIS — M79662 Pain in left lower leg: Secondary | ICD-10-CM

## 2023-02-02 DIAGNOSIS — M25521 Pain in right elbow: Secondary | ICD-10-CM | POA: Diagnosis not present

## 2023-02-02 NOTE — Therapy (Signed)
OUTPATIENT PHYSICAL THERAPY TREATMENT    Patient Name: Kristen Meyers MRN: [id number] DOB:[birth year], 52 y.o., female Today's Date: 02/02/2023  END OF SESSION:  PT End of Session - 02/02/23 1104     Visit Number 13    Number of Visits 26    Date for PT Re-Evaluation 03/21/23    Authorization Type [employer] copay 40    PT Start Time 1105    PT Stop Time 1147    PT Time Calculation (min) 42 min    Activity Tolerance Patient tolerated treatment well    Behavior During Therapy WFL for tasks assessed/performed              Past Medical History:  Diagnosis Date   Achilles tendonosis of left lower extremity    Acute DVT (deep venous thrombosis) (HCC)    DVT 2012-january, L leg, in the context of post-C section. Again 2021, L leg, dx'd shortly after having covid infection->took eliquis x 47mo.   Cholelithiasis without obstruction 09/19/09 (u/s)   Chronic elbow pain, right    Dr. Karie Schwalbe, MRI 2024->tendonopathy/partial tear of distal biceps tendon, thinning or partial tear of the lateral ulnar collateral ligament, spurring of the radial head and capitellum.   COVID-19 virus infection    approx 11/09/19---got antibody infusion   Diabetes mellitus without complication (HCC) 03/21/2010   Elevated transaminase level 07/19/2012   Essential hypertension 03/21/2010   Qualifier: Diagnosis of   By: Gabriel Rung LPN, Enid Baas liver 09/19/09 (u/s)   Mildly elevated transaminases   GERD 03/21/2010   History of adenomatous polyp of colon 12/2018   2020.  Normal 01/14/22. 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  01/14/22 no polyps. Recall 5 yrs.   Coronary calcium score  10/2022   Score 26, 90th%'tile   LAPAROSCOPIC HYSTERECTOMY  02/2014   Ovaries remain (tubes out): done for DUB   TRANSTHORACIC ECHOCARDIOGRAM     11/2022 EF 55-60%, grd I DD, AV sclerosis w/out stenosis.   TUBAL LIGATION  2011   Dr. Malachy Mood is her GYN.   WISDOM TOOTH EXTRACTION     Patient Active Problem List   Diagnosis Date Noted   Chronic elbow pain, right 12/11/2022   Left leg pain 12/11/2022   Chest pain of uncertain etiology 10/24/2022   Cardiac murmur 10/24/2022   Acute DVT (deep venous thrombosis) (HCC)    Cholelithiasis without obstruction    COVID-19 virus infection    Fatty liver    Obesity, Class II, BMI 35-39.9    Seizures (HCC)    History of laparoscopic-assisted vaginal hysterectomy 02/14/2020   Deep venous thrombosis (HCC) 02/14/2020   Subclinical hypothyroidism 2021   Melanocytic nevi, unspecified 12/30/2018   History  of adenomatous polyp of colon 12/2018   Diabetes mellitus without complication (HCC) 06/22/2015   Health maintenance examination 07/20/2013   NAFLD (nonalcoholic fatty liver disease) 78/29/5621   Obesity, morbid (HCC) 10/19/2012   Paresthesia of left arm 09/30/2012   Low back pain 09/30/2012   Left shoulder pain 09/30/2012   Type II or unspecified type diabetes mellitus without mention of complication, uncontrolled 07/19/2012   Elevated transaminase level 07/19/2012   History of herpes zoster 03/2012   Hyperlipidemia 03/21/2010   Essential hypertension 03/21/2010   GERD 03/21/2010    PCP: Dr Earley Favor  REFERRING PROVIDER:  Dr Thornell Mule, Maryjean Morn, MD   REFERRING DIAG: L leg pain (Dr. Benjamin Stain)   M54.50  (ICD-10-CM) - Acute right-sided low back pain without sciatica (Dr. Milinda Cave)   THERAPY DIAG:  Pain of left calf  Other symptoms and signs involving the musculoskeletal system  Muscle weakness (generalized)  Other abnormalities of gait and mobility  Other low back pain  Rationale for Evaluation and Treatment: Rehabilitation  ONSET DATE: 08/16/22  SUBJECTIVE:   SUBJECTIVE STATEMENT: Patient reports everything is feeling good today, no pain but some tightness in low back.  BACK EVAL: Patient with new referral for her back. Patient had a knot in the back for a couple months and aggravated it with weight training and then it began to spasm while in PT about a week ago. She does recall swinging her tennis racquet and felt a twinge in the back as her initial cause of pain. She kept playing when this occurred in September. She has noticed the knot before and spasms previously, but not to the extent of her recent pain. Patient reports her back is feeling better since last week. She did not have to take any muscle relaxers yesterday and did a mild workout without issues. Continues to take anti-inflammatories. No red flag symptoms. She has still not returned to tennis.    Eval: Patient reports that she has had L leg pain for several months with no known injury. Symptoms have increased in the past month. She has pain and inflammation in the L leg on constant basis now. Pain was intermittent prior to the past month. Had pain in the L knee 3/24 "floating knee cap", then L calf pain. Went to PT with some improvement in the knee with exercise and DN but she stated having pain again 6/24  PERTINENT HISTORY: AODM; HTN; elevated cholesterol; knee pain bilat; R elbow pain for ~ past 6-7 months did PT for tennis elbow with some improvement then symptoms started getting worse; history ot L LE DVT's 2003 and 2021  PAIN:  Are you having pain? Yes: NPRS scale: 1/10 Pain location: Lt achilles  Pain description:  tightness Aggravating factors: tennis; driving; standing; walking; sitting at desk or upright position Relieving factors: recliner; OTC meds; ice  PAIN:  Are you having pain? Yes: NPRS scale: 0 currently; at worst 10/10 Pain location: right low back Pain description: spasm Aggravating factors: unsure, movement Relieving factors: heat, TENS , medication, stretching   PRECAUTIONS: Other: history ot L LE DVT's 2003 and 2021 - [place] negative for DVT's 12/11/22  WEIGHT BEARING RESTRICTIONS: No  FALLS:  Has patient fallen in last 6 months? 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  DIAGNOSTIC FINDINGS: 12/11/22: Venous Img Lower L leg; Directed duplex of the left lower extremity negative for DVT   MUSCLE LENGTH: Hamstrings: Right 65 deg; Left 60 deg Thomas test:not tested - tight hip flexors to palpation   POSTURE: rounded shoulders, forward head, flexed trunk , and weight shift right  PALPATION: Tenderness and tightness to palpation L > R hip flexors; posterior hip; gastroc-soleus complex   01/23/23: Rt ASIS and iliac crest elevated in standing; TTP Rt lumbar paraspinals, QL   LOWER EXTREMITY ROM: tight L > R hip Active ROM Right eval Left eval 01/23/23 01/30/23  Hip flexion      Hip extension      Hip abduction      Hip adduction      Hip internal rotation      Hip external rotation      Knee flexion      Knee extension      Ankle dorsiflexion 9 5 Lt: 10; Rt: 14   Ankle plantarflexion      Ankle inversion 38 35    Ankle eversion 28 21 pain anterior leg     Great toe passive extension     Lt: 55; Rt: 70    (Blank rows = not tested)  LOWER EXTREMITY MMT: MMT  Right eval Left eval Right 01/01/23 Left 01/01/23 01/23/23   Hip flexion 4+ 4+ 4+ 4+ 5 bilateral   Hip extension 3+ 3+ 5 5 5  bilateral   Hip abduction 4 4- 5 4+ Lt: 4+; Rt: 4  Hip adduction   5 4   Hip internal rotation       Hip external rotation       Knee flexion 5 5     Knee extension 5 4- 5 5   Ankle dorsiflexion 4 4- 5 5   Ankle plantarflexion       Ankle inversion 5 5-     Ankle eversion 5 5-      (Blank rows = not tested) LUMBAR ROM:   Active  A/PROM  01/23/23  Flexion Full   Extension 25% limited   Right lateral flexion 25% limited pull  Left lateral flexion 25% limited pull   Right rotation 25% limited tight  Left rotation 25% limited tight    (Blank rows = not tested)  FUNCTIONAL TESTS:  5 times sit to stand: 10.41 sec use of UE's for momentum   GAIT: Distance walked: 40 feet  Assistive device utilized: None Level of assistance: Complete Independence Comments: decreased wt bearing with notable limp with stance to toe off on L   [facility] Adult PT Treatment:                                                DATE: 02/02/2023 Therapeutic Exercise: Recumbent bike L3 x Runner's lunge stretch --> gastroc/soleus Slow fwd/bkwd march + opp knee taps Squat side steps --> side shuffle Paloff press --> straight arm + trunk rotation Low-hi diagonal press with orange superband Heel raises on 4" step  Resisted walking with pulley--> fwd/bkwd 20# --> lateral 15# Wall squats --> added heel raises  Prone quad stretch w/strap 3x30" (B) SLB (in corner) + trunk rotation holding orange PB Doorway pec stretch (mid/high) 2x30"  [facility] Adult PT Treatment:                                                DATE: 01/30/23 Therapeutic Exercise: Recumbent bike level 2 x 5 minutes Gastroc/solues stretch on wedge x 1 minute each  Great toe flexor stretch x 30 sec DL calf raise focusing on full range 2 x 10  Seated marble pickups 2 x 10  HEP update  Manual Therapy: Skilled  palpation of trigger points Trigger Point Dry Needling Treatment: Pre-treatment instruction: Patient instructed on dry needling rationale, procedures, and possible side effects including pain during treatment (achy,cramping feeling), bruising, drop of blood, lightheadedness, nausea, sweating. Patient Consent Given: Yes Education handout provided: Previously provided Muscles treated: Lt gastroc/soleus   Treatment response/outcome: Twitch response elicited and Palpable decrease in muscle tension Post-treatment instructions: Patient instructed to expect possible mild to moderate muscle soreness later today and/or tomorrow. Patient instructed in methods to reduce muscle soreness and to continue prescribed HEP. If patient was dry needled over the lung field, patient was instructed on signs and symptoms of pneumothorax and, however unlikely, to see immediate medical attention should they occur. Patient was also educated on signs and symptoms of infection and to seek medical attention should they occur. Patient verbalized understanding of these instructions and education.  Gait training: Focusing on push-off during gait cycle x 40 ft     [facility] Adult PT Treatment:                                                DATE: 01/27/23 Therapeutic Exercise: Recumbent bike L2 x 5 min  Standing Walking laps in gym Slant board 30 seconds x 2 reps bilat Gastroc stretch 30 sec x 2 L Soleus stretch 30 sec x 2 L SLS x 20 seconds R and L Wall squat x 20 sec x 3  Prone  hip extension with cues to reduce knee flexion x 10 reps R and L Manual Therapy: STM L gastroc, L soleous, plantar surface of foot PROM great toe extension Joint moiblization L talus and calcaneous    PATIENT EDUCATION:  Education details: HEP update Person educated: Patient Education method: Explanation, demo, cues, handout Education comprehension: verbalized understanding, returned demo, cues   HOME EXERCISE PROGRAM: Access Code:  [email or web address] URL: [email or web address] Date: 01/30/2023 Prepared by: Letitia Libra  Exercises - Supine Piriformis Stretch with Leg Straight  - 2 x daily - 7 x weekly - 1 sets - 3 reps - 30 sec  hold - Hooklying Hamstring Stretch with Strap  - 2 x daily - 7 x weekly - 1 sets - 3 reps - 30 sec  hold - Supine ITB Stretch with Strap  - 2 x daily - 7 x weekly - 1 sets - 3 reps - 30 sec  hold - Gastroc Stretch on Wall  - 2 x daily - 7 x weekly - 1 sets - 3 reps - 30 sec  hold - Soleus Stretch on Wall  - 2 x daily - 7 x weekly - 1 sets - 3 reps - 30 sec  hold - Supine Quadriceps Stretch with Strap on Table  - 2 x daily - 7 x weekly - 1 sets -  3 reps - 30-60 sec hold - Standing Eccentric Heel Raise  - 1 x daily - 7 x weekly - 3 sets - 10 reps - 3 sec hold - Single Leg Stance  - 1 x daily - 7 x weekly - 2 sets - 3-5 reps - 20 sec hold - The Diver  - 1 x daily - 7 x weekly - 1 sets - 10 reps - 2-3 sec  hold - Towel Scrunches  - 1 x daily - 7 x weekly - 1 sets - 3 reps - 30 sec  hold - Toe Yoga - Alternating Great Toe and Lesser Toe Extension  - 1 x daily - 7 x weekly - 1 sets - 10 reps - 3 sec  hold - Arch Lifting  - 1 x daily - 7 x weekly - 1 sets - 10-15 reps - 2-3 sec  hold - sidelying low back stretch  - 1 x daily - 7 x weekly - 1 sets - 3 reps - 30 seconds hold - Seated Self Great Toe Stretch  - 1 x daily - 7 x weekly - 3 sets - 30 sec  hold  Access Code: [email or web address] URL: [email or web address] Date: 01/23/2023 Prepared by: Letitia Libra  Exercises - Supine Lower Trunk Rotation  - 1 x daily - 7 x weekly - 2 sets - 10 reps - Cat Cow  - 1 x daily - 7 x weekly - 2 sets - 10 reps - Sidelying Thoracic Rotation with Open Book  - 1 x daily - 7 x weekly - 2 sets - 10 reps - Standing Quadratus Lumborum Stretch with Doorway  - 1 x daily - 7 x weekly - 3 sets - 30 sec hold - Supine Pelvic Tilt  - 1 x daily - 7 x weekly - 2 sets - 10 reps - Seated 3 Way Exercise Ball Roll  Out Stretch  - 1 x daily - 7 x weekly - 2 sets - 10 reps   ASSESSMENT:  CLINICAL IMPRESSION: Dynamic quad and core strengthening progressed with resisted paloff press variations and resisted walking. Increased discomfort in knees during wall squat progression, however no exacerbation of pain in low back or calf. Single leg dynamic balance activities incorporated to progress patient back to playing tennis.   Re-evaluation completed as patient with updated referral for acute right-sided low back pain without sciatica. She reports initial onset of pain while playing tennis a couple months ago, but muscle spasm began in her back about a week ago. Overall her pain has improved, but has not returned to her previous level of activity prior to this acute flare up. She is noted to have limited lateral trunk flexion and rotation AROM with noted tightness/pulling with these movements. She has palpable tenderness about Rt lumbar musculature and postural abnormalities. HEP was issued to include trunk mobility She will benefit from continued skilled PT to address the above stated deficits in order to optimize her function and assist in overall pain reduction.    Eval: Patient is a 52 y.o. female who was seen today for physical therapy evaluation and treatment for L leg pain. She reports initial pain in the L knee and into L calf area at least 7 months ago. She had PT for the knee and dry needling for the calf with some improvement but then experienced pain in the L calf again beginning in June with symptoms increasing in the past month. Patient has no know injury. She does have  a history of DVT's in 2003 and 2021. [place] 9/24 negative for DVTs. Patient presents with abnormal gait pattern; muscular tightness to palpation; decreased ROM; weakness in L > R hips; knees and ankles. Patient will benefit from comprehensive PT program to address LE symptoms.   OBJECTIVE IMPAIRMENTS: Abnormal gait, decreased activity tolerance,  decreased ROM, decreased strength, hypomobility, impaired flexibility, improper body mechanics, postural dysfunction, obesity, and pain.    GOALS: Goals reviewed with patient? Yes  SHORT TERM GOALS: Target date: 01/29/2023  Independent in initial HEP  Baseline: Goal status: MET  2.  Increase AROM L bilat ankle DF by 3-5 degrees  Baseline:  Goal status: MET  LONG TERM GOALS: Target date: 03/21/23  Decrease pain L calf by 75-100% allowing patient to return to all normal functional activities  Baseline:  Goal status: INITIAL  2.  5/5 strength bilat LE's  Baseline:  Goal status: INITIAL  3.  Ambulation for 20-30 min with pain no greater than 1-2/10 and no limp  Baseline:  Goal status: INITIAL  4.  Patient returns to full functional and recreational activities including return to tennis  Baseline:  Goal status: INITIAL  5.  Independent in HEP, including aquatic therapy as indicated  Baseline:  Goal status: INITIAL  6.  Improve functional limitation 60  Baseline: 48 Goal status: INITIAL  6. Patient will demonstrate full and pain free trunk AROM to improve ability to complete reaching and bending activity.  Baseline: see above Goal status: NEW  PLAN:  PT FREQUENCY: 2x/week  PT DURATION: 8 weeks  PLANNED INTERVENTIONS: 97164- PT Re-evaluation, 97110-Therapeutic exercises, 97530- Therapeutic activity, [id number]- Neuromuscular re-education, 97535- Self Care, [id number]- Manual therapy, [id number]- Gait training, [id number]- Aquatic Therapy, 97014- Electrical stimulation (unattended), [id number]- Electrical stimulation (manual), [id number]- Vasopneumatic device, [id number]- Traction (mechanical), [id number]- Ionotophoresis 4mg /ml Dexamethasone, Taping, Dry Needling, Cryotherapy, and Moist heat.  PLAN FOR NEXT SESSION: Single leg dynamic activities and lateral movement in preparation for return to tennis; DN and STM/IASTM for calf L/R as needed; Progress eccentric and intrinsic foot strengthening. Trunk mobility;  core strengthening   Carlynn Herald, PTA 02/02/23 11:49 AM

## 2023-02-03 ENCOUNTER — Encounter: Payer: BC Managed Care – PPO | Admitting: Rehabilitative and Restorative Service Providers"

## 2023-02-05 ENCOUNTER — Ambulatory Visit: Payer: BC Managed Care – PPO

## 2023-02-05 DIAGNOSIS — M79662 Pain in left lower leg: Secondary | ICD-10-CM | POA: Diagnosis not present

## 2023-02-05 DIAGNOSIS — R2689 Other abnormalities of gait and mobility: Secondary | ICD-10-CM

## 2023-02-05 DIAGNOSIS — M5459 Other low back pain: Secondary | ICD-10-CM | POA: Diagnosis not present

## 2023-02-05 DIAGNOSIS — R29898 Other symptoms and signs involving the musculoskeletal system: Secondary | ICD-10-CM

## 2023-02-05 DIAGNOSIS — M6281 Muscle weakness (generalized): Secondary | ICD-10-CM

## 2023-02-05 DIAGNOSIS — M25521 Pain in right elbow: Secondary | ICD-10-CM | POA: Diagnosis not present

## 2023-02-05 DIAGNOSIS — G8929 Other chronic pain: Secondary | ICD-10-CM | POA: Diagnosis not present

## 2023-02-05 NOTE — Therapy (Signed)
OUTPATIENT PHYSICAL THERAPY TREATMENT    Patient Name: TYYANA THUESEN MRN: [id number] DOB:29-Dec-1970, 52 y.o., female Today's Date: 02/05/2023  END OF SESSION:  PT End of Session - 02/05/23 1245     Visit Number 14    Number of Visits 26    Date for PT Re-Evaluation 03/21/23    Authorization Type [employer] copay 40    Authorization Time Period year - 46 remaing for year per TL    Authorization - Number of Visits 60    PT Start Time 1147    PT Stop Time 1228    PT Time Calculation (min) 41 min    Activity Tolerance Patient tolerated treatment well               Past Medical History:  Diagnosis Date   Achilles tendonosis of left lower extremity    Acute DVT (deep venous thrombosis) (HCC)    DVT 2012-january, L leg, in the context of post-C section. Again 2021, L leg, dx'd shortly after having covid infection->took eliquis x 5mo.   Cholelithiasis without obstruction 09/19/09 (u/s)   Chronic elbow pain, right    Dr. Karie Schwalbe, MRI 2024->tendonopathy/partial tear of distal biceps tendon, thinning or partial tear of the lateral ulnar collateral ligament, spurring of the radial head and capitellum.   COVID-19 virus infection    approx 11/09/19---got antibody infusion   Diabetes mellitus without complication (HCC) 03/21/2010   Elevated transaminase level 07/19/2012   Essential hypertension 03/21/2010   Qualifier: Diagnosis of   By: Gabriel Rung LPN, Enid Baas liver 09/19/09 (u/s)   Mildly elevated transaminases   GERD 03/21/2010   History of adenomatous polyp of colon 12/2018   2020.  Normal 01/14/22. Recall 5 yrs   History of herpes zoster 03/2012   Hyperlipidemia 03/21/2010   Qualifier: Diagnosis of   By: Gabriel Rung LPN, Harriett Sine         Melanocytic nevi, unspecified 12/30/2018   NAFLD (nonalcoholic fatty liver disease) 78/29/5621   Obesity, Class II, BMI 35-39.9    Paresthesia of left arm 09/30/2012   Seizures (HCC)    as child- had 2 none since- no current treatment- never dx'd  with any seizure d/o    Subclinical hypothyroidism 2021   2021-22->multinod goiter on u/s 01/2021   Past Surgical History:  Procedure Laterality Date   CARDIOVASCULAR STRESS TEST     MPI 11/2022 normal   CESAREAN SECTION     2003, 2005   COLONOSCOPY  01/05/2019   2020 polyps.  01/14/22 no polyps. Recall 5 yrs.   Coronary calcium score  10/2022   Score 26, 90th%'tile   LAPAROSCOPIC HYSTERECTOMY  02/2014   Ovaries remain (tubes out): done for DUB   TRANSTHORACIC ECHOCARDIOGRAM     11/2022 EF 55-60%, grd I DD, AV sclerosis w/out stenosis.   TUBAL LIGATION  2011   Dr. Malachy Mood is her GYN.   WISDOM TOOTH EXTRACTION     Patient Active Problem List   Diagnosis Date Noted   Chronic elbow pain, right 12/11/2022   Left leg pain 12/11/2022   Chest pain of uncertain etiology 10/24/2022   Cardiac murmur 10/24/2022   Acute DVT (deep venous thrombosis) (HCC)    Cholelithiasis without obstruction    COVID-19 virus infection    Fatty liver    Obesity, Class II, BMI 35-39.9    Seizures (HCC)    History of laparoscopic-assisted vaginal hysterectomy 02/14/2020   Deep venous thrombosis (HCC) 02/14/2020  Subclinical hypothyroidism 2021   Melanocytic nevi, unspecified 12/30/2018   History of adenomatous polyp of colon 12/2018   Diabetes mellitus without complication (HCC) 06/22/2015   Health maintenance examination 07/20/2013   NAFLD (nonalcoholic fatty liver disease) 57/84/6962   Obesity, morbid (HCC) 10/19/2012   Paresthesia of left arm 09/30/2012   Low back pain 09/30/2012   Left shoulder pain 09/30/2012   Type II or unspecified type diabetes mellitus without mention of complication, uncontrolled 07/19/2012   Elevated transaminase level 07/19/2012   History of herpes zoster 03/2012   Hyperlipidemia 03/21/2010   Essential hypertension 03/21/2010   GERD 03/21/2010    PCP: Dr Earley Favor  REFERRING PROVIDER:  Dr Thornell Mule, Maryjean Morn, MD   REFERRING DIAG: L leg  pain (Dr. Benjamin Stain)   M54.50 (ICD-10-CM) - Acute right-sided low back pain without sciatica (Dr. Milinda Cave)   THERAPY DIAG:  Pain of left calf  Other symptoms and signs involving the musculoskeletal system  Muscle weakness (generalized)  Other abnormalities of gait and mobility  Other low back pain  Rationale for Evaluation and Treatment: Rehabilitation  ONSET DATE: 08/16/22  SUBJECTIVE:   SUBJECTIVE STATEMENT: Patient returned to the clinic stating that her low back has been feeling good with no complaints. Her L ankle has been feeling a bit tighter but not painful, she points to the L anterolateral ankle below the lateral malleolus as the region of the tightness.  BACK EVAL: Patient with new referral for her back. Patient had a knot in the back for a couple months and aggravated it with weight training and then it began to spasm while in PT about a week ago. She does recall swinging her tennis racquet and felt a twinge in the back as her initial cause of pain. She kept playing when this occurred in September. She has noticed the knot before and spasms previously, but not to the extent of her recent pain. Patient reports her back is feeling better since last week. She did not have to take any muscle relaxers yesterday and did a mild workout without issues. Continues to take anti-inflammatories. No red flag symptoms. She has still not returned to tennis.    Eval: Patient reports that she has had L leg pain for several months with no known injury. Symptoms have increased in the past month. She has pain and inflammation in the L leg on constant basis now. Pain was intermittent prior to the past month. Had pain in the L knee 3/24 "floating knee cap", then L calf pain. Went to PT with some improvement in the knee with exercise and DN but she stated having pain again 6/24  PERTINENT HISTORY: AODM; HTN; elevated cholesterol; knee pain bilat; R elbow pain for ~ past 6-7 months did PT for  tennis elbow with some improvement then symptoms started getting worse; history ot L LE DVT's 2003 and 2021  PAIN:  Are you having pain? Yes: NPRS scale: 1/10 Pain location: Lt achilles  Pain description: tightness Aggravating factors: tennis; driving; standing; walking; sitting at desk or upright position Relieving factors: recliner; OTC meds; ice  PAIN:  Are you having pain? Yes: NPRS scale: 0 currently; at worst 10/10 Pain location: right low back Pain description: spasm Aggravating factors: unsure, movement Relieving factors: heat, TENS , medication, stretching   PRECAUTIONS: Other: history ot L LE DVT's 2003 and 2021 - [place] negative for DVT's 12/11/22  WEIGHT BEARING RESTRICTIONS: No  FALLS:  Has patient fallen in last 6 months?  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  DIAGNOSTIC FINDINGS: 12/11/22: Venous Img Lower L leg; Directed duplex of the left lower extremity negative for DVT   MUSCLE LENGTH: Hamstrings: Right 65 deg; Left 60 deg Thomas test:not tested - tight hip flexors to palpation   POSTURE: rounded shoulders, forward head, flexed trunk , and weight shift right  PALPATION: Tenderness and tightness to palpation L > R hip flexors; posterior hip; gastroc-soleus complex   01/23/23: Rt ASIS and iliac crest elevated in standing; TTP Rt lumbar paraspinals, QL   LOWER EXTREMITY ROM: tight L > R hip Active ROM Right eval Left eval 01/23/23 01/30/23  Hip flexion      Hip extension      Hip abduction      Hip adduction      Hip internal rotation      Hip external rotation      Knee flexion      Knee extension      Ankle dorsiflexion 9 5 Lt: 10; Rt: 14    Ankle plantarflexion      Ankle inversion 38 35    Ankle eversion 28 21 pain anterior leg     Great toe passive extension     Lt: 55; Rt: 70    (Blank rows = not tested)  LOWER EXTREMITY MMT: MMT Right eval Left eval Right 01/01/23 Left 01/01/23 01/23/23   Hip flexion 4+ 4+ 4+ 4+ 5 bilateral   Hip extension 3+ 3+ 5 5 5  bilateral   Hip abduction 4 4- 5 4+ Lt: 4+; Rt: 4  Hip adduction   5 4   Hip internal rotation       Hip external rotation       Knee flexion 5 5     Knee extension 5 4- 5 5   Ankle dorsiflexion 4 4- 5 5   Ankle plantarflexion       Ankle inversion 5 5-     Ankle eversion 5 5-      (Blank rows = not tested) LUMBAR ROM:   Active  A/PROM  01/23/23  Flexion Full   Extension 25% limited   Right lateral flexion 25% limited pull  Left lateral flexion 25% limited pull   Right rotation 25% limited tight  Left rotation 25% limited tight    (Blank rows = not tested)  FUNCTIONAL TESTS:  5 times sit to stand: 10.41 sec use of UE's for momentum   GAIT: Distance walked: 40 feet  Assistive device utilized: None Level of assistance: Complete Independence Comments: decreased wt bearing with notable limp with stance to toe off on L   [facility] Adult PT Treatment:                                                DATE: 02/05/23 Therapeutic Exercise: Supine: Manual stretching of the L ankle into plantarflexion + inversion + digit flexion  Standing L plantarflexion + inversion/eversion disc slides - sliding L foot side to side across front of body maintaining plantarflexion and  MTP contact, x 30 reps L plantarflexion disc slides - sliding L foot forward on disc maintaining MTP contact and heel low, x 30 reps L end range plantarflexion with R foot on 4" on step with upper extremity assist, x 30 reps L staggered stance balance on rolling pin with upper extremity assist, 3 x 30 sec L staggered stance balance on BAPS board, ball of left foot on folded towel to elevate L heel, 3  x 30 sec Manual Therapy: Supine: Dorsal glides at 3-5th tarsometatarsal joints with movement into L foot/ankle plantarflexion + inversion  [facility] Adult PT Treatment:                                                DATE: 02/02/2023 Therapeutic Exercise: Recumbent bike L3 x Runner's lunge stretch --> gastroc/soleus Slow fwd/bkwd march + opp knee taps Squat side steps --> side shuffle Paloff press --> straight arm + trunk rotation Low-hi diagonal press with orange superband Heel raises on 4" step  Resisted walking with pulley--> fwd/bkwd 20# --> lateral 15# Wall squats --> added heel raises  Prone quad stretch w/strap 3x30" (B) SLB (in corner) + trunk rotation holding orange PB Doorway pec stretch (mid/high) 2x30"  [facility] Adult PT Treatment:                                                DATE: 01/30/23 Therapeutic Exercise: Recumbent bike level 2 x 5 minutes Gastroc/solues stretch on wedge x 1 minute each  Great toe flexor stretch x 30 sec DL calf raise focusing on full range 2 x 10  Seated marble pickups 2 x 10  HEP update  Manual Therapy: Skilled palpation of trigger points Trigger Point Dry Needling Treatment: Pre-treatment instruction: Patient instructed on dry needling rationale, procedures, and possible side effects including pain during treatment (achy,cramping feeling), bruising, drop of blood, lightheadedness, nausea, sweating. Patient Consent Given: Yes Education handout provided: Previously provided Muscles treated: Lt gastroc/soleus   Treatment response/outcome: Twitch response elicited and Palpable decrease in muscle tension Post-treatment instructions: Patient instructed to expect possible mild to moderate muscle soreness later today and/or tomorrow. Patient instructed in methods to reduce muscle soreness and to continue prescribed HEP. If patient was dry needled over the lung field, patient was instructed on signs and symptoms of pneumothorax and, however unlikely, to  see immediate medical attention should they occur. Patient was also educated on signs and symptoms of infection and to seek medical attention should they occur. Patient verbalized understanding of these instructions and education.  Gait training: Focusing on push-off during gait cycle x 40 ft   [facility] Adult PT Treatment:                                                DATE: 01/27/23 Therapeutic Exercise: Recumbent bike L2 x 5 min  Standing Walking laps in gym Slant board 30 seconds x 2 reps bilat Gastroc stretch 30 sec x 2 L Soleus stretch 30 sec x 2 L SLS x 20 seconds R and L Wall squat x 20 sec x 3  Prone  hip extension with cues to reduce knee flexion x 10 reps R and L Manual Therapy: STM L gastroc, L soleous, plantar surface of foot PROM great toe extension Joint moiblization L talus and calcaneous   PATIENT EDUCATION:  Education details: HEP update Person educated: Patient Education method: Explanation, demo, cues, handout Education comprehension: verbalized understanding, returned demo, cues   HOME EXERCISE PROGRAM: Access Code: [email or web address] URL: [email or web address] Date: 01/30/2023 Prepared by: Letitia Libra  Exercises - Supine Piriformis Stretch with Leg Straight  - 2 x daily - 7 x weekly - 1 sets - 3 reps - 30 sec  hold - Hooklying Hamstring Stretch with Strap  - 2 x daily - 7 x weekly - 1 sets - 3 reps - 30 sec  hold - Supine ITB Stretch with Strap  - 2 x daily - 7 x weekly - 1 sets - 3 reps - 30 sec  hold - Gastroc Stretch on Wall  - 2 x daily - 7 x weekly - 1 sets - 3 reps - 30 sec  hold - Soleus Stretch on Wall  - 2 x daily - 7 x weekly - 1 sets - 3 reps - 30 sec  hold - Supine Quadriceps Stretch with Strap on Table  - 2 x daily - 7 x weekly - 1 sets - 3 reps - 30-60 sec hold - Standing Eccentric Heel Raise  - 1 x daily - 7 x weekly - 3 sets - 10 reps - 3 sec hold - Single Leg Stance  - 1 x daily - 7 x weekly - 2 sets - 3-5 reps - 20 sec hold - The  Diver  - 1 x daily - 7 x weekly - 1 sets - 10 reps - 2-3 sec  hold - Towel Scrunches  - 1 x daily - 7 x weekly - 1 sets - 3 reps - 30 sec  hold - Toe Yoga - Alternating Great Toe and Lesser Toe Extension  - 1 x daily - 7 x weekly - 1 sets - 10 reps - 3 sec  hold - Arch Lifting  - 1 x daily - 7 x weekly - 1 sets - 10-15 reps - 2-3 sec  hold - sidelying low back stretch  - 1 x daily - 7 x weekly - 1 sets - 3 reps - 30 seconds hold - Seated Self Great Toe Stretch  - 1 x daily - 7 x weekly - 3 sets - 30 sec  hold  Access Code: [email or web address] URL: [email or web address] Date: 01/23/2023 Prepared by: Letitia Libra  Exercises - Supine Lower Trunk Rotation  - 1 x daily - 7 x weekly - 2 sets - 10 reps - Cat Cow  - 1 x daily - 7 x weekly - 2 sets - 10 reps - Sidelying Thoracic Rotation with Open Book  - 1 x daily - 7 x weekly - 2 sets - 10 reps - Standing Quadratus Lumborum Stretch with Doorway  - 1 x daily - 7 x weekly - 3 sets - 30 sec hold - Supine Pelvic Tilt  - 1 x daily - 7 x weekly - 2 sets - 10 reps - Seated 3 Way Exercise Ball Roll Out Stretch  - 1 x daily - 7 x weekly - 2 sets - 10 reps   ASSESSMENT:  CLINICAL IMPRESSION: Noted sensation of tightness at the L lateral ankle moving into L foot/ankle plantarflexion with inversion. Performed manual therapy and manual stretching  in attempts to improve symptoms. Followed up with therapeutic exercises to improve end range motion into these directions as well as exercises to continue L foot/ankle proprioception. Patient tolerated session well without increases in L ankle symptoms. Physical therapy remains indicated.  Re-evaluation completed as patient with updated referral for acute right-sided low back pain without sciatica. She reports initial onset of pain while playing tennis a couple months ago, but muscle spasm began in her back about a week ago. Overall her pain has improved, but has not returned to her previous level of  activity prior to this acute flare up. She is noted to have limited lateral trunk flexion and rotation AROM with noted tightness/pulling with these movements. She has palpable tenderness about Rt lumbar musculature and postural abnormalities. HEP was issued to include trunk mobility She will benefit from continued skilled PT to address the above stated deficits in order to optimize her function and assist in overall pain reduction.    Eval: Patient is a 52 y.o. female who was seen today for physical therapy evaluation and treatment for L leg pain. She reports initial pain in the L knee and into L calf area at least 7 months ago. She had PT for the knee and dry needling for the calf with some improvement but then experienced pain in the L calf again beginning in June with symptoms increasing in the past month. Patient has no know injury. She does have a history of DVT's in 2003 and 2021. [place] 9/24 negative for DVTs. Patient presents with abnormal gait pattern; muscular tightness to palpation; decreased ROM; weakness in L > R hips; knees and ankles. Patient will benefit from comprehensive PT program to address LE symptoms.   OBJECTIVE IMPAIRMENTS: Abnormal gait, decreased activity tolerance, decreased ROM, decreased strength, hypomobility, impaired flexibility, improper body mechanics, postural dysfunction, obesity, and pain.    GOALS: Goals reviewed with patient? Yes  SHORT TERM GOALS: Target date: 01/29/2023  Independent in initial HEP  Baseline: Goal status: MET  2.  Increase AROM L bilat ankle DF by 3-5 degrees  Baseline:  Goal status: MET  LONG TERM GOALS: Target date: 03/21/23  Decrease pain L calf by 75-100% allowing patient to return to all normal functional activities  Baseline:  Goal status: INITIAL  2.  5/5 strength bilat LE's  Baseline:  Goal status: INITIAL  3.  Ambulation for 20-30 min with pain no greater than 1-2/10 and no limp  Baseline:  Goal status: INITIAL  4.   Patient returns to full functional and recreational activities including return to tennis  Baseline:  Goal status: INITIAL  5.  Independent in HEP, including aquatic therapy as indicated  Baseline:  Goal status: INITIAL  6.  Improve functional limitation 60  Baseline: 48 Goal status: INITIAL  6. Patient will demonstrate full and pain free trunk AROM to improve ability to complete reaching and bending activity.  Baseline: see above Goal status: NEW  PLAN:  PT FREQUENCY: 2x/week  PT DURATION: 8 weeks  PLANNED INTERVENTIONS: 97164- PT Re-evaluation, 97110-Therapeutic exercises, 97530- Therapeutic activity, [id number]- Neuromuscular re-education, 97535- Self Care, [id number]- Manual therapy, [id number]- Gait training, [id number]- Aquatic Therapy, 97014- Electrical stimulation (unattended), [id number]- Electrical stimulation (manual), [id number]- Vasopneumatic device, [id number]- Traction (mechanical), [id number]- Ionotophoresis 4mg /ml Dexamethasone, Taping, Dry Needling, Cryotherapy, and Moist heat.  PLAN FOR NEXT SESSION: Single leg dynamic activities and lateral movement in preparation for return to tennis; DN and STM/IASTM for calf L/R as needed; Progress eccentric and intrinsic foot strengthening. Trunk mobility;  core strengthening; assess R biceps complaint  Edmonia Caprio, PT, DPT 02/05/23 12:53 PM

## 2023-02-10 ENCOUNTER — Ambulatory Visit: Payer: BC Managed Care – PPO | Admitting: Physical Therapy

## 2023-02-10 ENCOUNTER — Encounter: Payer: Self-pay | Admitting: Physical Therapy

## 2023-02-10 DIAGNOSIS — G8929 Other chronic pain: Secondary | ICD-10-CM | POA: Diagnosis not present

## 2023-02-10 DIAGNOSIS — M6281 Muscle weakness (generalized): Secondary | ICD-10-CM

## 2023-02-10 DIAGNOSIS — M79662 Pain in left lower leg: Secondary | ICD-10-CM | POA: Diagnosis not present

## 2023-02-10 DIAGNOSIS — R29898 Other symptoms and signs involving the musculoskeletal system: Secondary | ICD-10-CM

## 2023-02-10 DIAGNOSIS — M25521 Pain in right elbow: Secondary | ICD-10-CM

## 2023-02-10 DIAGNOSIS — R2689 Other abnormalities of gait and mobility: Secondary | ICD-10-CM | POA: Diagnosis not present

## 2023-02-10 DIAGNOSIS — M5459 Other low back pain: Secondary | ICD-10-CM

## 2023-02-10 NOTE — Therapy (Signed)
OUTPATIENT PHYSICAL THERAPY TREATMENT AND ELBOW EVALUATION    Patient Name: Kristen Meyers MRN: [id number] DOB:1970-07-15, 52 y.o., female Today's Date: 02/10/2023  END OF SESSION:  PT End of Session - 02/10/23 1146     Visit Number 15    Number of Visits 26    Date for PT Re-Evaluation 03/21/23    Authorization Type [employer] copay 40    Authorization Time Period year - 66 remaing for year per TL    Authorization - Visit Number 13    Authorization - Number of Visits 60    PT Start Time 1146    PT Stop Time 1230    PT Time Calculation (min) 44 min    Activity Tolerance Patient tolerated treatment well    Behavior During Therapy WFL for tasks assessed/performed                Past Medical History:  Diagnosis Date   Achilles tendonosis of left lower extremity    Acute DVT (deep venous thrombosis) (HCC)    DVT 2012-january, L leg, in the context of post-C section. Again 2021, L leg, dx'd shortly after having covid infection->took eliquis x 42mo.   Cholelithiasis without obstruction 09/19/09 (u/s)   Chronic elbow pain, right    Dr. Karie Schwalbe, MRI 2024->tendonopathy/partial tear of distal biceps tendon, thinning or partial tear of the lateral ulnar collateral ligament, spurring of the radial head and capitellum.   COVID-19 virus infection    approx 11/09/19---got antibody infusion   Diabetes mellitus without complication (HCC) 03/21/2010   Elevated transaminase level 07/19/2012   Essential hypertension 03/21/2010   Qualifier: Diagnosis of   By: Gabriel Rung LPN, Enid Baas liver 09/19/09 (u/s)   Mildly elevated transaminases   GERD 03/21/2010   History of adenomatous polyp of colon 12/2018   2020.  Normal 01/14/22. 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  01/14/22 no polyps. Recall 5 yrs.   Coronary calcium score  10/2022   Score 26, 90th%'tile   LAPAROSCOPIC HYSTERECTOMY  02/2014   Ovaries remain (tubes out): done for DUB   TRANSTHORACIC ECHOCARDIOGRAM     11/2022 EF 55-60%, grd I DD, AV sclerosis w/out stenosis.   TUBAL LIGATION  2011   Dr. Malachy Mood is her GYN.   WISDOM TOOTH EXTRACTION     Patient Active Problem List   Diagnosis Date Noted   Chronic elbow pain, right 12/11/2022   Left leg pain 12/11/2022   Chest pain of uncertain etiology 10/24/2022   Cardiac murmur 10/24/2022   Acute DVT (deep venous thrombosis) (HCC)    Cholelithiasis without obstruction    COVID-19 virus infection    Fatty liver    Obesity, Class II, BMI  35-39.9    Seizures (HCC)    History of laparoscopic-assisted vaginal hysterectomy 02/14/2020   Deep venous thrombosis (HCC) 02/14/2020   Subclinical hypothyroidism 2021   Melanocytic nevi, unspecified 12/30/2018   History of adenomatous polyp of colon 12/2018   Diabetes mellitus without complication (HCC) 06/22/2015   Health maintenance examination 07/20/2013   NAFLD (nonalcoholic fatty liver disease) 16/12/9602   Obesity, morbid (HCC) 10/19/2012   Paresthesia of left arm 09/30/2012   Low back pain 09/30/2012   Left shoulder pain 09/30/2012   Type II or unspecified type diabetes mellitus without mention of complication, uncontrolled 07/19/2012   Elevated transaminase level 07/19/2012   History of herpes zoster 03/2012   Hyperlipidemia 03/21/2010   Essential hypertension 03/21/2010   GERD 03/21/2010     PCP: Dr Earley Favor  REFERRING PROVIDER:  Dr Thornell Mule, Maryjean Morn, MD   REFERRING DIAG: L leg pain (Dr. Benjamin Stain)   M54.50 (ICD-10-CM) - Acute right-sided low back pain without sciatica (Dr. Milinda Cave)   M25.521,G89.29 (ICD-10-CM) - Chronic elbow pain, right   THERAPY DIAG:  Pain in right elbow  Muscle weakness (generalized)  Other symptoms and signs involving the musculoskeletal system  Other abnormalities of gait and mobility  Other low back pain  Pain of left calf  Rationale for Evaluation and Treatment: Rehabilitation  ONSET DATE: 08/16/22  SUBJECTIVE:   SUBJECTIVE STATEMENT: ELBOW EVAL: She has been having right elbow pain since March. It's gotten a little better. MRI shows biceps tear. It's gotten better. If pick up something top heavy or too far behind me it hurts and it is weaker. Can't do normal work out. And it was starting to hurt with some tennis swings. She can lift hair dryer now. Typing too long hurts.  BACK EVAL: Patient with new referral for her back. Patient had a knot in the back for a couple months and aggravated it with weight training and then it began to spasm while in PT about a week ago. She does recall swinging her tennis racquet and felt a twinge in the back as her initial cause of pain. She kept playing when this occurred in September. She has noticed the knot before and spasms previously, but not to the extent of her recent pain. Patient reports her back is feeling better since last week. She did not have to take any muscle relaxers yesterday and did a mild workout without issues. Continues to take anti-inflammatories. No red flag symptoms. She has still not returned to tennis.    Eval: Patient reports that she has had L leg pain for several months with no known injury. Symptoms have increased in the past month. She has pain and inflammation in the L leg on constant basis now. Pain was intermittent prior to the past month.  Had pain in the L knee 3/24 "floating knee cap", then L calf pain. Went to PT with some improvement in the knee with exercise and DN but she stated having pain again 6/24  PERTINENT HISTORY: AODM; HTN; elevated cholesterol; knee pain bilat; R elbow pain for ~ past 6-7 months did PT for tennis elbow with some improvement then symptoms started getting worse; history ot L LE DVT's 2003 and 2021  PAIN:  Are you having pain? Yes: NPRS scale: 1/10 Pain location: Lt achilles  Pain description: tightness Aggravating factors: tennis; driving; standing; walking; sitting at desk or upright position Relieving factors: recliner; OTC meds; ice  PAIN:  Are you having pain? Yes: NPRS scale:  0 currently; at worst 10/10 Pain location: right low back Pain description: spasm Aggravating factors: unsure, movement Relieving factors: heat, TENS , medication, stretching   PAIN:  Are you having pain? Yes: NPRS scale: 4/10 Pain location: lower biceps proximal elbow Pain description: achy Aggravating factors: lifting, typing, reaching behind her Relieving factors: uses nitro patch, stretches    PRECAUTIONS: Other: history ot L LE DVT's 2003 and 2021 - [place] negative for DVT's 12/11/22  WEIGHT BEARING RESTRICTIONS: No  FALLS:  Has patient fallen in last 6 months? 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  DIAGNOSTIC FINDINGS: 12/11/22: Venous Img Lower L leg; Directed duplex of the left lower extremity negative for DVT   ELBOW MRI IMPRESSION  10/6: 1. Partial tearing, tendinopathy, and peritendinitis of the distal biceps tendon particularly along the long head contribution. No overt  rupture. 2. Thinning or partial tearing of the proximal lateral ulnar collateral ligament. 3. Spurring of the radial head and capitellum.    MUSCLE LENGTH: Hamstrings: Right 65 deg; Left 60 deg Thomas test:not tested - tight hip flexors to palpation   POSTURE: rounded shoulders, forward head, flexed trunk , and weight shift right  PALPATION: Eval: Tenderness and tightness to palpation L > R hip flexors; posterior hip; gastroc-soleus complex   01/23/23: Rt ASIS and iliac crest elevated in standing; TTP Rt lumbar paraspinals, QL  11/26 tender to R proximal and distal biceps, common extensor tendon   LOWER EXTREMITY ROM: tight L > R hip Active ROM Right eval Left eval 01/23/23 01/30/23  Hip flexion      Hip extension      Hip abduction      Hip adduction      Hip internal rotation      Hip external rotation      Knee flexion      Knee extension      Ankle dorsiflexion 9 5 Lt: 10; Rt: 14   Ankle plantarflexion      Ankle inversion 38 35    Ankle eversion 28 21 pain anterior leg     Great toe passive extension     Lt: 55; Rt: 70    (Blank rows = not tested)  LOWER EXTREMITY MMT: MMT Right eval Left eval Right 01/01/23 Left 01/01/23 01/23/23   Hip flexion 4+ 4+ 4+ 4+ 5 bilateral   Hip extension 3+ 3+ 5 5 5  bilateral   Hip abduction 4 4- 5 4+ Lt: 4+; Rt: 4  Hip adduction   5 4   Hip internal rotation       Hip external rotation       Knee flexion 5 5     Knee extension 5 4- 5 5   Ankle dorsiflexion 4 4- 5 5   Ankle plantarflexion       Ankle inversion 5 5-     Ankle eversion 5 5-      (Blank rows = not tested) LUMBAR ROM:   Active  A/PROM  01/23/23  Flexion Full   Extension 25% limited   Right lateral flexion 25% limited pull  Left lateral flexion 25% limited pull   Right rotation 25% limited tight  Left rotation 25% limited tight    (Blank rows = not tested)  UPPER EXTREMITY MMT:  11/26  MMT Right eval Left eval  Shoulder flexion 4 4-  Shoulder  extension 4+ 5  Shoulder abduction 4- 4-  Shoulder adduction    Shoulder extension    Shoulder internal rotation 4 5  Shoulder external rotation 4- 5  Middle trapezius    Lower trapezius    Elbow flexion 5   Elbow extension    Wrist flexion 5   Wrist extension 5   Wrist ulnar deviation    Wrist radial deviation    Wrist pronation    Wrist supination    Grip strength 44 44   (Blank rows = not tested)   UPPER EXTREMITY ROM:  11/26  A/P ROM Right eval Left eval  Shoulder flexion Full   Shoulder extension Full   Shoulder abduction Full   Shoulder internal rotation 85/120   Shoulder external rotation 57/63   Elbow flexion full   Elbow extension full   Wrist flexion    Wrist extension    Wrist ulnar deviation    Wrist radial deviation    Wrist pronation full   Wrist supination 60 62   (Blank rows = not tested)   SPECIAL TESTS:  11/26  painful arc with shoulder flexion in standing (negative in supine), else negative special tests  FUNCTIONAL TESTS:  5 times sit to stand: 10.41 sec use of UE's for momentum   GAIT: Distance walked: 40 feet  Assistive device utilized: None Level of assistance: Complete Independence Comments: decreased wt bearing with notable limp with stance to toe off on L   [facility] Adult PT Treatment:                                                DATE: 02/10/23 Re-evaluation Elbow pain Therapeutic Exercise: R shoulder IR and ER x 15 RTB Standing 3 way raise x 15 RTB - flex/scap/ABD Standing Rows Blue TB x 15 Standing shoulder ext Blue TB x 15 Seated R biceps curl 4# x 15 3 sec up/3 sec hold/3 sec eccentric lower Seated R supination/pronation 4# x 15  [facility] Adult PT Treatment:                                                DATE: 02/05/23 Therapeutic Exercise: Supine: Manual stretching of the L ankle into plantarflexion + inversion + digit flexion  Standing L plantarflexion + inversion/eversion disc slides - sliding L foot side to side across front  of body maintaining plantarflexion and MTP contact, x 30 reps L plantarflexion disc slides - sliding L foot forward on disc maintaining MTP contact and heel low, x 30 reps L end range plantarflexion with R foot on 4" on step with upper extremity assist, x 30 reps L staggered stance balance on rolling pin with upper extremity assist, 3 x 30 sec L staggered stance balance on BAPS board, ball of left foot on folded towel to elevate L heel, 3 x 30 sec Manual Therapy: Supine: Dorsal glides at 3-5th tarsometatarsal joints with movement into L foot/ankle plantarflexion + inversion  [facility] Adult PT Treatment:  DATE: 02/02/2023 Therapeutic Exercise: Recumbent bike L3 x Runner's lunge stretch --> gastroc/soleus Slow fwd/bkwd march + opp knee taps Squat side steps --> side shuffle Paloff press --> straight arm + trunk rotation Low-hi diagonal press with orange superband Heel raises on 4" step  Resisted walking with pulley--> fwd/bkwd 20# --> lateral 15# Wall squats --> added heel raises  Prone quad stretch w/strap 3x30" (B) SLB (in corner) + trunk rotation holding orange PB Doorway pec stretch (mid/high) 2x30"  [facility] Adult PT Treatment:                                                DATE: 01/30/23 Therapeutic Exercise: Recumbent bike level 2 x 5 minutes Gastroc/solues stretch on wedge x 1 minute each  Great toe flexor stretch x 30 sec DL calf raise focusing on full range 2 x 10  Seated marble pickups 2 x 10  HEP update  Manual Therapy: Skilled palpation of trigger points Trigger Point Dry Needling Treatment: Pre-treatment instruction: Patient instructed on dry needling rationale, procedures, and possible side effects including pain during treatment (achy,cramping feeling), bruising, drop of blood, lightheadedness, nausea, sweating. Patient Consent Given: Yes Education handout provided: Previously provided Muscles treated: Lt gastroc/soleus    Treatment response/outcome: Twitch response elicited and Palpable decrease in muscle tension Post-treatment instructions: Patient instructed to expect possible mild to moderate muscle soreness later today and/or tomorrow. Patient instructed in methods to reduce muscle soreness and to continue prescribed HEP. If patient was dry needled over the lung field, patient was instructed on signs and symptoms of pneumothorax and, however unlikely, to see immediate medical attention should they occur. Patient was also educated on signs and symptoms of infection and to seek medical attention should they occur. Patient verbalized understanding of these instructions and education.  Gait training: Focusing on push-off during gait cycle x 40 ft   [facility] Adult PT Treatment:                                                DATE: 01/27/23 Therapeutic Exercise: Recumbent bike L2 x 5 min  Standing Walking laps in gym Slant board 30 seconds x 2 reps bilat Gastroc stretch 30 sec x 2 L Soleus stretch 30 sec x 2 L SLS x 20 seconds R and L Wall squat x 20 sec x 3  Prone  hip extension with cues to reduce knee flexion x 10 reps R and L Manual Therapy: STM L gastroc, L soleous, plantar surface of foot PROM great toe extension Joint moiblization L talus and calcaneous   PATIENT EDUCATION:  Education details: HEP update Person educated: Patient Education method: Explanation, demo, cues, handout Education comprehension: verbalized understanding, returned demo, cues   HOME EXERCISE PROGRAM: Access Code: [email or web address] URL: [email or web address] Date: 01/30/2023 Prepared by: Letitia Libra  Exercises - Supine Piriformis Stretch with Leg Straight  - 2 x daily - 7 x weekly - 1 sets - 3 reps - 30 sec  hold - Hooklying Hamstring Stretch with Strap  - 2 x daily - 7 x weekly - 1 sets - 3 reps - 30 sec  hold - Supine ITB Stretch with Strap  - 2 x daily - 7 x weekly - 1  sets - 3 reps - 30 sec  hold - Gastroc  Stretch on Wall  - 2 x daily - 7 x weekly - 1 sets - 3 reps - 30 sec  hold - Soleus Stretch on Wall  - 2 x daily - 7 x weekly - 1 sets - 3 reps - 30 sec  hold - Supine Quadriceps Stretch with Strap on Table  - 2 x daily - 7 x weekly - 1 sets - 3 reps - 30-60 sec hold - Standing Eccentric Heel Raise  - 1 x daily - 7 x weekly - 3 sets - 10 reps - 3 sec hold - Single Leg Stance  - 1 x daily - 7 x weekly - 2 sets - 3-5 reps - 20 sec hold - The Diver  - 1 x daily - 7 x weekly - 1 sets - 10 reps - 2-3 sec  hold - Towel Scrunches  - 1 x daily - 7 x weekly - 1 sets - 3 reps - 30 sec  hold - Toe Yoga - Alternating Great Toe and Lesser Toe Extension  - 1 x daily - 7 x weekly - 1 sets - 10 reps - 3 sec  hold - Arch Lifting  - 1 x daily - 7 x weekly - 1 sets - 10-15 reps - 2-3 sec  hold - sidelying low back stretch  - 1 x daily - 7 x weekly - 1 sets - 3 reps - 30 seconds hold - Seated Self Great Toe Stretch  - 1 x daily - 7 x weekly - 3 sets - 30 sec  hold  Access Code: [email or web address] URL: [email or web address] Date: 01/23/2023 Prepared by: Letitia Libra  Exercises - Supine Lower Trunk Rotation  - 1 x daily - 7 x weekly - 2 sets - 10 reps - Cat Cow  - 1 x daily - 7 x weekly - 2 sets - 10 reps - Sidelying Thoracic Rotation with Open Book  - 1 x daily - 7 x weekly - 2 sets - 10 reps - Standing Quadratus Lumborum Stretch with Doorway  - 1 x daily - 7 x weekly - 3 sets - 30 sec hold - Supine Pelvic Tilt  - 1 x daily - 7 x weekly - 2 sets - 10 reps - Seated 3 Way Exercise Ball Roll Out Stretch  - 1 x daily - 7 x weekly - 2 sets - 10 reps   ASSESSMENT:  CLINICAL IMPRESSION: Re-evaluation completed for elbow: Leela presents with reports of R elbow pain since March of this year. Pain has improved, but she still is limited with lifting, reaching backwards and typing. She demonstrates full elbow ROM and strength, but does have significant shoulder weakness B. She has tenderness in the LH  biceps tendon and in the biceps muscle belly as well as the common extensor tendons. She will benefit from skilled PT to address these deficits.  Good tolerance to initial TE today except reports of some shoulder pain with end range resisted ABDuction.    Re-evaluation completed as patient with updated referral for acute right-sided low back pain without sciatica. She reports initial onset of pain while playing tennis a couple months ago, but muscle spasm began in her back about a week ago. Overall her pain has improved, but has not returned to her previous level of activity prior to this acute flare up. She is noted to have limited lateral trunk flexion and rotation AROM with noted  tightness/pulling with these movements. She has palpable tenderness about Rt lumbar musculature and postural abnormalities. HEP was issued to include trunk mobility She will benefit from continued skilled PT to address the above stated deficits in order to optimize her function and assist in overall pain reduction.    Eval: Patient is a 52 y.o. female who was seen today for physical therapy evaluation and treatment for L leg pain. She reports initial pain in the L knee and into L calf area at least 7 months ago. She had PT for the knee and dry needling for the calf with some improvement but then experienced pain in the L calf again beginning in June with symptoms increasing in the past month. Patient has no know injury. She does have a history of DVT's in 2003 and 2021. [place] 9/24 negative for DVTs. Patient presents with abnormal gait pattern; muscular tightness to palpation; decreased ROM; weakness in L > R hips; knees and ankles. Patient will benefit from comprehensive PT program to address LE symptoms.   OBJECTIVE IMPAIRMENTS: Abnormal gait, decreased activity tolerance, decreased ROM, decreased strength, hypomobility, impaired flexibility, improper body mechanics, postural dysfunction, obesity, and pain.    GOALS: Goals  reviewed with patient? Yes  SHORT TERM GOALS: Target date: 01/29/2023  Independent in initial HEP  Baseline: Goal status: MET  2.  Increase AROM L bilat ankle DF by 3-5 degrees  Baseline:  Goal status: MET  LONG TERM GOALS: Target date: 03/21/23  Decrease pain L calf by 75-100% allowing patient to return to all normal functional activities  Baseline:  Goal status: INITIAL  2.  5/5 strength bilat LE's  Baseline:  Goal status: INITIAL  3.  Ambulation for 20-30 min with pain no greater than 1-2/10 and no limp  Baseline:  Goal status: INITIAL  4.  Patient returns to full functional and recreational activities including return to tennis  Baseline:  Goal status: INITIAL  5.  Independent in HEP, including aquatic therapy as indicated  Baseline:  Goal status: INITIAL  6.  Improve functional limitation 60  Baseline: 48 Goal status: INITIAL  6. Patient will demonstrate full and pain free trunk AROM to improve ability to complete reaching and bending activity.  Baseline: see above Goal status: NEW  7.  Increased R shoulder strength to 5/5 Baseline:  Goal status: NEW  8. Decreased R elbow and biceps pain by 85% with ADLs to improve QOL. Baseline:  Goal status: NEW   PLAN:  PT FREQUENCY: 2x/week  PT DURATION: 8 weeks  PLANNED INTERVENTIONS: 97164- PT Re-evaluation, 97110-Therapeutic exercises, 97530- Therapeutic activity, [id number]- Neuromuscular re-education, 97535- Self Care, 13086- Manual therapy, [id number]- Gait training, [id number]- Aquatic Therapy, 97014- Electrical stimulation (unattended), [id number]- Electrical stimulation (manual), [id number]- Vasopneumatic device, [id number]- Traction (mechanical), [id number]- Ionotophoresis 4mg /ml Dexamethasone, Taping, Dry Needling, Cryotherapy, and Moist heat.  PLAN FOR NEXT SESSION: Elbow: DN/MT to R biceps, common extensor tendon, R shoulder strength, biceps eccentric focus, grip/putty. Single leg dynamic activities and lateral movement in preparation for  return to tennis; DN and STM/IASTM for calf L/R as needed; Progress eccentric and intrinsic foot strengthening. Trunk mobility; core strengthening  Solon Palm, PT  02/10/23 12:55 PM

## 2023-02-18 ENCOUNTER — Ambulatory Visit: Payer: BC Managed Care – PPO | Attending: Sports Medicine

## 2023-02-18 DIAGNOSIS — R29898 Other symptoms and signs involving the musculoskeletal system: Secondary | ICD-10-CM | POA: Diagnosis present

## 2023-02-18 DIAGNOSIS — R2689 Other abnormalities of gait and mobility: Secondary | ICD-10-CM | POA: Diagnosis present

## 2023-02-18 DIAGNOSIS — M79662 Pain in left lower leg: Secondary | ICD-10-CM | POA: Diagnosis present

## 2023-02-18 DIAGNOSIS — M25521 Pain in right elbow: Secondary | ICD-10-CM

## 2023-02-18 DIAGNOSIS — M5459 Other low back pain: Secondary | ICD-10-CM

## 2023-02-18 DIAGNOSIS — M6281 Muscle weakness (generalized): Secondary | ICD-10-CM | POA: Diagnosis present

## 2023-02-18 NOTE — Therapy (Signed)
OUTPATIENT PHYSICAL THERAPY TREATMENT     Patient Name: Kristen Meyers MRN: [id number] DOB:18-Jun-1970, 52 y.o., female Today's Date: 02/18/2023  END OF SESSION:  PT End of Session - 02/18/23 1146     Visit Number 16    Number of Visits 26    Date for PT Re-Evaluation 03/21/23    Authorization Type [employer] copay 40    Authorization Time Period year - 21 remaing for year per TL    PT Start Time 1146    PT Stop Time 1229    PT Time Calculation (min) 43 min    Activity Tolerance Patient tolerated treatment well    Behavior During Therapy WFL for tasks assessed/performed                 Past Medical History:  Diagnosis Date   Achilles tendonosis of left lower extremity    Acute DVT (deep venous thrombosis) (HCC)    DVT 2012-january, L leg, in the context of post-C section. Again 2021, L leg, dx'd shortly after having covid infection->took eliquis x 71mo.   Cholelithiasis without obstruction 09/19/09 (u/s)   Chronic elbow pain, right    Dr. Karie Schwalbe, MRI 2024->tendonopathy/partial tear of distal biceps tendon, thinning or partial tear of the lateral ulnar collateral ligament, spurring of the radial head and capitellum.   COVID-19 virus infection    approx 11/09/19---got antibody infusion   Diabetes mellitus without complication (HCC) 03/21/2010   Elevated transaminase level 07/19/2012   Essential hypertension 03/21/2010   Qualifier: Diagnosis of   By: Gabriel Rung LPN, Enid Baas liver 09/19/09 (u/s)   Mildly elevated transaminases   GERD 03/21/2010   History of adenomatous polyp of colon 12/2018   2020.  Normal 01/14/22. 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  01/14/22 no polyps. Recall 5 yrs.   Coronary calcium score  10/2022   Score 26, 90th%'tile   LAPAROSCOPIC HYSTERECTOMY  02/2014   Ovaries remain (tubes out): done for DUB   TRANSTHORACIC ECHOCARDIOGRAM     11/2022 EF 55-60%, grd I DD, AV sclerosis w/out stenosis.   TUBAL LIGATION  2011   Dr. Malachy Mood is her GYN.   WISDOM TOOTH EXTRACTION     Patient Active Problem List   Diagnosis Date Noted   Chronic elbow pain, right 12/11/2022   Left leg pain 12/11/2022   Chest pain of uncertain etiology 10/24/2022   Cardiac murmur 10/24/2022   Acute DVT (deep venous thrombosis) (HCC)    Cholelithiasis without obstruction    COVID-19 virus infection    Fatty liver    Obesity, Class II, BMI 35-39.9    Seizures (HCC)    History of laparoscopic-assisted vaginal hysterectomy 02/14/2020   Deep  venous thrombosis (HCC) 02/14/2020   Subclinical hypothyroidism 2021   Melanocytic nevi, unspecified 12/30/2018   History of adenomatous polyp of colon 12/2018   Diabetes mellitus without complication (HCC) 06/22/2015   Health maintenance examination 07/20/2013   NAFLD (nonalcoholic fatty liver disease) 96/06/5407   Obesity, morbid (HCC) 10/19/2012   Paresthesia of left arm 09/30/2012   Low back pain 09/30/2012   Left shoulder pain 09/30/2012   Type II or unspecified type diabetes mellitus without mention of complication, uncontrolled 07/19/2012   Elevated transaminase level 07/19/2012   History of herpes zoster 03/2012   Hyperlipidemia 03/21/2010   Essential hypertension 03/21/2010   GERD 03/21/2010    PCP: Dr Earley Favor  REFERRING PROVIDER:  Dr Thornell Mule, Maryjean Morn,  MD   REFERRING DIAG: L leg pain (Dr. Benjamin Stain)   M54.50 (ICD-10-CM) - Acute right-sided low back pain without sciatica (Dr. Milinda Cave)   M25.521,G89.29 (ICD-10-CM) - Chronic elbow pain, right   THERAPY DIAG:  Pain in right elbow  Muscle weakness (generalized)  Other symptoms and signs involving the musculoskeletal system  Other abnormalities of gait and mobility  Other low back pain  Pain of left calf  Rationale for Evaluation and Treatment: Rehabilitation  ONSET DATE: 08/16/22  SUBJECTIVE:   SUBJECTIVE STATEMENT: Overall feeling good. A little tightness/soreness in the Achilles when going to walk after sitting for awhile. Did some strengthening with the arms without issues. Still not playing tennis yet. Back has been feeling good and has not experienced any pain in this area over the past week.     ELBOW EVAL: She has been having right elbow pain since March. It's gotten a little better. MRI shows biceps tear. It's gotten better. If pick up something top heavy or too far behind me it hurts and it is weaker. Can't do normal work out. And it was starting to hurt with some tennis swings. She can lift hair dryer now. Typing too long hurts.  BACK EVAL: Patient with new referral for her back. Patient had a knot in the back for a couple months and aggravated it with weight training and then it began to spasm while in PT about a week ago. She does recall swinging her tennis racquet and felt a twinge in the back as her initial cause of pain. She kept playing when this occurred in September. She has noticed the knot before and spasms previously, but not to the extent of her recent pain. Patient reports her back is feeling better since last week. She did not have to take any muscle relaxers yesterday and did a mild workout without issues. Continues to take anti-inflammatories. No red flag symptoms. She has still not returned to tennis.   Eval: Patient reports that she has had L leg pain for  several months with no known injury. Symptoms have increased in the past month. She has pain and inflammation in the L leg on constant basis now. Pain was intermittent prior to the past month. Had pain in the L knee 3/24 "floating knee cap", then L calf pain. Went to PT with some improvement in the knee with exercise and DN but she stated having pain again 6/24  PERTINENT HISTORY: AODM; HTN; elevated cholesterol; knee pain bilat; R elbow pain for ~ past 6-7 months did PT for tennis elbow with some improvement then symptoms started getting worse; history ot L LE DVT's 2003 and 2021  PAIN:  Are you having pain? Yes: NPRS scale: none currently; at worst  5/10 Pain location: Lt achilles  Pain description: tightness Aggravating factors: tennis; driving; standing; walking; sitting at desk or upright position Relieving factors: recliner; OTC meds; ice  PAIN:  Are you having pain? Yes: NPRS scale: none currently; 4 at worst /10 Pain location: lower biceps proximal elbow Pain description: achy Aggravating factors: lifting, typing, reaching behind her Relieving factors: uses nitro patch, stretches    PRECAUTIONS: Other: history ot L LE DVT's 2003 and 2021 - [place] negative for DVT's 12/11/22  WEIGHT BEARING RESTRICTIONS: No  FALLS:  Has patient fallen in last 6 months? 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  DIAGNOSTIC FINDINGS: 12/11/22: Venous Img Lower L leg; Directed duplex of the left lower extremity negative for DVT   ELBOW MRI IMPRESSION  10/6: 1. Partial tearing, tendinopathy, and peritendinitis of the distal biceps tendon particularly along the long head contribution.  No overt rupture. 2. Thinning or partial tearing of the proximal lateral ulnar collateral ligament. 3. Spurring of the radial head and capitellum.    MUSCLE LENGTH: Hamstrings: Right 65 deg; Left 60 deg Thomas test:not tested - tight hip flexors to palpation   POSTURE: rounded shoulders, forward head, flexed trunk , and weight shift right  PALPATION: Eval: Tenderness and tightness to palpation L > R hip flexors; posterior hip; gastroc-soleus complex   01/23/23: Rt ASIS and iliac crest elevated in standing; TTP Rt lumbar paraspinals, QL  11/26 tender to R proximal and distal biceps, common extensor tendon   LOWER EXTREMITY ROM: tight L > R hip Active ROM Right eval Left eval 01/23/23 01/30/23  Hip flexion      Hip extension      Hip abduction      Hip adduction      Hip internal rotation      Hip external rotation      Knee flexion      Knee extension      Ankle dorsiflexion 9 5 Lt: 10; Rt: 14   Ankle plantarflexion      Ankle inversion 38 35    Ankle eversion 28 21 pain anterior leg     Great toe passive extension     Lt: 55; Rt: 70    (Blank rows = not tested)  LOWER EXTREMITY MMT: MMT Right eval Left eval Right 01/01/23 Left 01/01/23 01/23/23   Hip flexion 4+ 4+ 4+ 4+ 5 bilateral   Hip extension 3+ 3+ 5 5 5  bilateral   Hip abduction 4 4- 5 4+ Lt: 4+; Rt: 4  Hip adduction   5 4   Hip internal rotation       Hip external rotation       Knee flexion 5 5     Knee extension 5 4- 5 5   Ankle dorsiflexion 4 4- 5 5   Ankle plantarflexion       Ankle inversion 5 5-     Ankle eversion 5 5-      (Blank rows = not tested) LUMBAR ROM:   Active  A/PROM  01/23/23  Flexion Full   Extension 25% limited   Right lateral flexion 25% limited pull  Left lateral flexion 25% limited pull  Right rotation 25% limited tight  Left rotation 25% limited tight    (Blank rows = not tested)  UPPER EXTREMITY MMT:  11/26  MMT Right eval Left eval 02/18/23 Right   Shoulder  flexion 4 4-   Shoulder extension 4+ 5   Shoulder abduction 4- 4-   Shoulder adduction     Shoulder extension     Shoulder internal rotation 4 5   Shoulder external rotation 4- 5   Middle trapezius     Lower trapezius     Elbow flexion 5  5 pain with neutral and supinated positioning   Elbow extension     Wrist flexion 5    Wrist extension 5    Wrist ulnar deviation     Wrist radial deviation     Wrist pronation     Wrist supination     Grip strength 44 44    (Blank rows = not tested)   UPPER EXTREMITY ROM:  11/26  A/P ROM Right eval Left eval  Shoulder flexion Full   Shoulder extension Full   Shoulder abduction Full   Shoulder internal rotation 85/120   Shoulder external rotation 57/63   Elbow flexion full   Elbow extension full   Wrist flexion    Wrist extension    Wrist ulnar deviation    Wrist radial deviation    Wrist pronation full   Wrist supination 60 62   (Blank rows = not tested)   SPECIAL TESTS:  11/26  painful arc with shoulder flexion in standing (negative in supine), else negative special tests  FUNCTIONAL TESTS:  5 times sit to stand: 10.41 sec use of UE's for momentum   GAIT: Distance walked: 40 feet  Assistive device utilized: None Level of assistance: Complete Independence Comments: decreased wt bearing with notable limp with stance to toe off on L  [facility] Adult PT Treatment:                                                DATE: 02/18/23 Therapeutic Exercise: NuStep level 6 x 5 minutes UE/LE  Walking 1 lap in clinic with tape donned  3 way elbow flexion with silicone cupping x 10 each  Standing shoulder flexion with silicone cupping x 10  Standing shoulder abduction with silicone cupping x 10  Standing shoulder scaption with silicone cupping x 10  Supination AROM with silicone cupping x 10  Eccentric bicep curl 3# x 10 each 3 way grip  Manual Therapy: Heel cup taping to Lt heel   Self Care: Potential benefit of gel heel cup and where to  purchase Pain free UE strengthening with personal trainer, hold on tennis   [facility] Adult PT Treatment:                                                DATE: 02/10/23 Re-evaluation Elbow pain Therapeutic Exercise: R shoulder IR and ER x 15 RTB Standing 3 way raise x 15 RTB - flex/scap/ABD Standing Rows Blue TB x 15 Standing shoulder ext Blue TB x 15 Seated R biceps curl 4# x 15 3 sec up/3 sec hold/3 sec eccentric lower Seated R supination/pronation 4# x 15  [facility] Adult PT Treatment:  DATE: 02/05/23 Therapeutic Exercise: Supine: Manual stretching of the L ankle into plantarflexion + inversion + digit flexion  Standing L plantarflexion + inversion/eversion disc slides - sliding L foot side to side across front of body maintaining plantarflexion and MTP contact, x 30 reps L plantarflexion disc slides - sliding L foot forward on disc maintaining MTP contact and heel low, x 30 reps L end range plantarflexion with R foot on 4" on step with upper extremity assist, x 30 reps L staggered stance balance on rolling pin with upper extremity assist, 3 x 30 sec L staggered stance balance on BAPS board, ball of left foot on folded towel to elevate L heel, 3 x 30 sec Manual Therapy: Supine: Dorsal glides at 3-5th tarsometatarsal joints with movement into L foot/ankle plantarflexion + inversion  PATIENT EDUCATION:  Education details: HEP update; see treatment  Person educated: Patient Education method: Explanation, demo, cues, handout Education comprehension: verbalized understanding, returned demo, cues   HOME EXERCISE PROGRAM: Access Code: [email or web address] URL: [email or web address] Date: 01/30/2023 Prepared by: Letitia Libra  Exercises - Supine Piriformis Stretch with Leg Straight  - 2 x daily - 7 x weekly - 1 sets - 3 reps - 30 sec  hold - Hooklying Hamstring Stretch with Strap  - 2 x daily - 7 x weekly - 1 sets - 3 reps - 30 sec  hold -  Supine ITB Stretch with Strap  - 2 x daily - 7 x weekly - 1 sets - 3 reps - 30 sec  hold - Gastroc Stretch on Wall  - 2 x daily - 7 x weekly - 1 sets - 3 reps - 30 sec  hold - Soleus Stretch on Wall  - 2 x daily - 7 x weekly - 1 sets - 3 reps - 30 sec  hold - Supine Quadriceps Stretch with Strap on Table  - 2 x daily - 7 x weekly - 1 sets - 3 reps - 30-60 sec hold - Standing Eccentric Heel Raise  - 1 x daily - 7 x weekly - 3 sets - 10 reps - 3 sec hold - Single Leg Stance  - 1 x daily - 7 x weekly - 2 sets - 3-5 reps - 20 sec hold - The Diver  - 1 x daily - 7 x weekly - 1 sets - 10 reps - 2-3 sec  hold - Towel Scrunches  - 1 x daily - 7 x weekly - 1 sets - 3 reps - 30 sec  hold - Toe Yoga - Alternating Great Toe and Lesser Toe Extension  - 1 x daily - 7 x weekly - 1 sets - 10 reps - 3 sec  hold - Arch Lifting  - 1 x daily - 7 x weekly - 1 sets - 10-15 reps - 2-3 sec  hold - sidelying low back stretch  - 1 x daily - 7 x weekly - 1 sets - 3 reps - 30 seconds hold - Seated Self Great Toe Stretch  - 1 x daily - 7 x weekly - 3 sets - 30 sec  hold  Access Code: [email or web address] BACK AND ELBOW  URL: [email or web address] Date: 02/18/2023 Prepared by: Letitia Libra  Exercises - Supine Lower Trunk Rotation  - 1 x daily - 7 x weekly - 2 sets - 10 reps - Cat Cow  - 1 x daily - 7 x weekly - 2 sets - 10 reps - Sidelying Thoracic Rotation with Open Book  - 1 x  daily - 7 x weekly - 2 sets - 10 reps - Standing Quadratus Lumborum Stretch with Doorway  - 1 x daily - 7 x weekly - 3 sets - 30 sec hold - Supine Pelvic Tilt  - 1 x daily - 7 x weekly - 2 sets - 10 reps - Seated 3 Way Exercise Ball Roll Out Stretch  - 1 x daily - 7 x weekly - 2 sets - 10 reps - Shoulder External Rotation with Anchored Resistance  - 1 x daily - 7 x weekly - 1-3 sets - 10 reps - Shoulder Internal Rotation with Resistance  - 1 x daily - 7 x weekly - 1-3 sets - 10 reps - Standing Single Arm Shoulder Flexion with Resistance  -  1 x daily - 3-4 x weekly - 3 sets - 10 reps - Standing Single Arm Shoulder Abduction with Resistance  - 1 x daily - 3-4 x weekly - 3 sets - 10 reps - Standing Single Arm Shoulder Abduction with Resistance  - 1 x daily - 3-4 x weekly - 3 sets - 10 reps - Standing Shoulder Row with Anchored Resistance  - 1 x daily - 3-4 x weekly - 3 sets - 10 reps - Shoulder extension with resistance - Neutral  - 1 x daily - 3-4 x weekly - 3 sets - 10 reps - Seated Single Arm Elbow Flexion with Dumbbell  - 1 x daily - 3-4 x weekly - 3 sets - 10 reps - 3 sec  hold   ASSESSMENT:  CLINICAL IMPRESSION: Patient arrives without reports of pain, though notes achilles pain when she goes to stand after prolonged sitting and elbow pain with reaching activity. Trial of heel cup taping to the Lt foot with patient reporting improved support/comfort on the heel when ambulating in clinic. Discussed potential benefit of gel heel cup for recreational activity. Focused on bicep strengthening, utilizing silicone cupping with some strengthening exercises today with good tolerance. She does have pain with biased MMT of the bicep brachii and brachioradialis, but does exhibit full strength. Was recommended to continue with pain free UE strengthening with personal trainer, but hold on tennis at this time with patient verbalizing understanding.      Re-evaluation completed for elbow: Anahat presents with reports of R elbow pain since March of this year. Pain has improved, but she still is limited with lifting, reaching backwards and typing. She demonstrates full elbow ROM and strength, but does have significant shoulder weakness B. She has tenderness in the LH biceps tendon and in the biceps muscle belly as well as the common extensor tendons. She will benefit from skilled PT to address these deficits.  Good tolerance to initial TE today except reports of some shoulder pain with end range resisted ABDuction.   Re-evaluation completed as  patient with updated referral for acute right-sided low back pain without sciatica. She reports initial onset of pain while playing tennis a couple months ago, but muscle spasm began in her back about a week ago. Overall her pain has improved, but has not returned to her previous level of activity prior to this acute flare up. She is noted to have limited lateral trunk flexion and rotation AROM with noted tightness/pulling with these movements. She has palpable tenderness about Rt lumbar musculature and postural abnormalities. HEP was issued to include trunk mobility She will benefit from continued skilled PT to address the above stated deficits in order to optimize her function and assist in overall  pain reduction.   Eval: Patient is a 52 y.o. female who was seen today for physical therapy evaluation and treatment for L leg pain. She reports initial pain in the L knee and into L calf area at least 7 months ago. She had PT for the knee and dry needling for the calf with some improvement but then experienced pain in the L calf again beginning in June with symptoms increasing in the past month. Patient has no know injury. She does have a history of DVT's in 2003 and 2021. [place] 9/24 negative for DVTs. Patient presents with abnormal gait pattern; muscular tightness to palpation; decreased ROM; weakness in L > R hips; knees and ankles. Patient will benefit from comprehensive PT program to address LE symptoms.   OBJECTIVE IMPAIRMENTS: Abnormal gait, decreased activity tolerance, decreased ROM, decreased strength, hypomobility, impaired flexibility, improper body mechanics, postural dysfunction, obesity, and pain.    GOALS: Goals reviewed with patient? Yes  SHORT TERM GOALS: Target date: 01/29/2023  Independent in initial HEP  Baseline: Goal status: MET  2.  Increase AROM L bilat ankle DF by 3-5 degrees  Baseline:  Goal status: MET  LONG TERM GOALS: Target date: 03/21/23  Decrease pain L calf by 75-100%  allowing patient to return to all normal functional activities  Baseline:  Goal status: INITIAL  2.  5/5 strength bilat LE's  Baseline:  Goal status: INITIAL  3.  Ambulation for 20-30 min with pain no greater than 1-2/10 and no limp  Baseline:  Goal status: INITIAL  4.  Patient returns to full functional and recreational activities including return to tennis  Baseline:  Goal status: INITIAL  5.  Independent in HEP, including aquatic therapy as indicated  Baseline:  Goal status: INITIAL  6.  Improve functional limitation 60  Baseline: 48 Goal status: INITIAL  6. Patient will demonstrate full and pain free trunk AROM to improve ability to complete reaching and bending activity.  Baseline: see above Goal status: NEW  7.  Increased R shoulder strength to 5/5 Baseline:  Goal status: NEW  8. Decreased R elbow and biceps pain by 85% with ADLs to improve QOL. Baseline:  Goal status: NEW   PLAN:  PT FREQUENCY: 2x/week  PT DURATION: 8 weeks  PLANNED INTERVENTIONS: 97164- PT Re-evaluation, 97110-Therapeutic exercises, 97530- Therapeutic activity, [id number]- Neuromuscular re-education, 97535- Self Care, [id number]- Manual therapy, [id number]- Gait training, [id number]- Aquatic Therapy, 97014- Electrical stimulation (unattended), [id number]- Electrical stimulation (manual), [id number]- Vasopneumatic device, [id number]- Traction (mechanical), [id number]- Ionotophoresis 4mg /ml Dexamethasone, Taping, Dry Needling, Cryotherapy, and Moist heat.  PLAN FOR NEXT SESSION: Elbow: DN/MT to R biceps, common extensor tendon, R shoulder strength, biceps eccentric focus, grip/putty. Single leg dynamic activities and lateral movement in preparation for return to tennis; DN and STM/IASTM for calf L/R as needed; Progress eccentric and intrinsic foot strengthening. Trunk mobility; core strengthening  Letitia Libra, PT, DPT, ATC 02/18/23 12:30 PM

## 2023-02-20 ENCOUNTER — Ambulatory Visit: Payer: BC Managed Care – PPO

## 2023-02-20 DIAGNOSIS — M25521 Pain in right elbow: Secondary | ICD-10-CM | POA: Diagnosis not present

## 2023-02-20 DIAGNOSIS — M6281 Muscle weakness (generalized): Secondary | ICD-10-CM | POA: Diagnosis not present

## 2023-02-20 DIAGNOSIS — R29898 Other symptoms and signs involving the musculoskeletal system: Secondary | ICD-10-CM

## 2023-02-20 DIAGNOSIS — R2689 Other abnormalities of gait and mobility: Secondary | ICD-10-CM | POA: Diagnosis not present

## 2023-02-20 DIAGNOSIS — M79662 Pain in left lower leg: Secondary | ICD-10-CM

## 2023-02-20 DIAGNOSIS — M5459 Other low back pain: Secondary | ICD-10-CM | POA: Diagnosis not present

## 2023-02-20 NOTE — Therapy (Signed)
OUTPATIENT PHYSICAL THERAPY TREATMENT     Patient Name: Kristen Meyers MRN: [id number] DOB:[birth year], 52 y.o., female Today's Date: 02/20/2023  END OF SESSION:  PT End of Session - 02/20/23 1022     Visit Number 17    Number of Visits 26    Date for PT Re-Evaluation 03/21/23    Authorization Type [employer] copay 40    Authorization Time Period year - 19 remaing for year per TL    PT Start Time 1020    PT Stop Time 1059    PT Time Calculation (min) 39 min    Activity Tolerance Patient tolerated treatment well    Behavior During Therapy WFL for tasks assessed/performed                  Past Medical History:  Diagnosis Date   Achilles tendonosis of left lower extremity    Acute DVT (deep venous thrombosis) (HCC)    DVT 2012-january, L leg, in the context of post-C section. Again 2021, L leg, dx'd shortly after having covid infection->took eliquis x 63mo.   Cholelithiasis without obstruction 09/19/09 (u/s)   Chronic elbow pain, right    Dr. Karie Schwalbe, MRI 2024->tendonopathy/partial tear of distal biceps tendon, thinning or partial tear of the lateral ulnar collateral ligament, spurring of the radial head and capitellum.   COVID-19 virus infection    approx 11/09/19---got antibody infusion   Diabetes mellitus without complication (HCC) 03/21/2010   Elevated transaminase level 07/19/2012   Essential hypertension 03/21/2010   Qualifier: Diagnosis of   By: Gabriel Rung LPN, Enid Baas liver 09/19/09 (u/s)   Mildly elevated transaminases   GERD 03/21/2010   History of adenomatous polyp of colon 12/2018   2020.  Normal 01/14/22. 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  01/14/22 no polyps. Recall 5 yrs.   Coronary calcium score  10/2022   Score 26, 90th%'tile   LAPAROSCOPIC HYSTERECTOMY  02/2014   Ovaries remain (tubes out): done for DUB   TRANSTHORACIC ECHOCARDIOGRAM     11/2022 EF 55-60%, grd I DD, AV sclerosis w/out stenosis.   TUBAL LIGATION  2011   Dr. Malachy Mood is her GYN.   WISDOM TOOTH EXTRACTION     Patient Active Problem List   Diagnosis Date Noted   Chronic elbow pain, right 12/11/2022   Left leg pain 12/11/2022   Chest pain of uncertain etiology 10/24/2022   Cardiac murmur 10/24/2022   Acute DVT (deep venous thrombosis) (HCC)    Cholelithiasis without obstruction    COVID-19 virus infection    Fatty liver    Obesity, Class II, BMI 35-39.9    Seizures (HCC)    History of laparoscopic-assisted vaginal hysterectomy 02/14/2020  Deep venous thrombosis (HCC) 02/14/2020   Subclinical hypothyroidism 2021   Melanocytic nevi, unspecified 12/30/2018   History of adenomatous polyp of colon 12/2018   Diabetes mellitus without complication (HCC) 06/22/2015   Health maintenance examination 07/20/2013   NAFLD (nonalcoholic fatty liver disease) 09/81/1914   Obesity, morbid (HCC) 10/19/2012   Paresthesia of left arm 09/30/2012   Low back pain 09/30/2012   Left shoulder pain 09/30/2012   Type II or unspecified type diabetes mellitus without mention of complication, uncontrolled 07/19/2012   Elevated transaminase level 07/19/2012   History of herpes zoster 03/2012   Hyperlipidemia 03/21/2010   Essential hypertension 03/21/2010   GERD 03/21/2010    PCP: Dr Earley Favor  REFERRING PROVIDER:  Dr Thornell Mule, Maryjean Morn,  MD   REFERRING DIAG: L leg pain (Dr. Benjamin Stain)   M54.50 (ICD-10-CM) - Acute right-sided low back pain without sciatica (Dr. Milinda Cave)   M25.521,G89.29 (ICD-10-CM) - Chronic elbow pain, right   THERAPY DIAG:  Pain in right elbow  Muscle weakness (generalized)  Other symptoms and signs involving the musculoskeletal system  Other abnormalities of gait and mobility  Other low back pain  Pain of left calf  Rationale for Evaluation and Treatment: Rehabilitation  ONSET DATE: 08/16/22  SUBJECTIVE:   SUBJECTIVE STATEMENT: Patient did a pretty good workout with her personal trainer yesterday without pain. The heel cup felt good after last session, so she ordered the gel cups.     ELBOW EVAL: She has been having right elbow pain since March. It's gotten a little better. MRI shows biceps tear. It's gotten better. If pick up something top heavy or too far behind me it hurts and it is weaker. Can't do normal work out. And it was starting to hurt with some tennis swings. She can lift hair dryer now. Typing too long hurts.  BACK EVAL: Patient with new referral for her back. Patient had a knot in the back for a couple months and aggravated it with weight training and then it began to spasm while in PT about a week ago. She does recall swinging her tennis racquet and felt a twinge in the back as her initial cause of pain. She kept playing when this occurred in September. She has noticed the knot before and spasms previously, but not to the extent of her recent pain. Patient reports her back is feeling better since last week. She did not have to take any muscle relaxers yesterday and did a mild workout without issues. Continues to take anti-inflammatories. No red flag symptoms. She has still not returned to tennis.   Eval: Patient reports that she has had L leg pain for several months with no known injury. Symptoms have increased in the past month. She has pain and inflammation in the L leg on  constant basis now. Pain was intermittent prior to the past month. Had pain in the L knee 3/24 "floating knee cap", then L calf pain. Went to PT with some improvement in the knee with exercise and DN but she stated having pain again 6/24  PERTINENT HISTORY: AODM; HTN; elevated cholesterol; knee pain bilat; R elbow pain for ~ past 6-7 months did PT for tennis elbow with some improvement then symptoms started getting worse; history ot L LE DVT's 2003 and 2021  PAIN:  Are you having pain? Yes: NPRS scale: none currently; at worst 5/10 Pain location: Lt achilles  Pain description: tightness Aggravating factors: tennis; driving; standing; walking; sitting at desk or upright  position Relieving factors: recliner; OTC meds; ice  PAIN:  Are you having pain? Yes: NPRS scale: 1/10 Pain location: lower biceps proximal elbow Pain description: achy Aggravating factors: lifting, typing, reaching behind her Relieving factors: uses nitro patch, stretches    PRECAUTIONS: Other: history ot L LE DVT's 2003 and 2021 - [place] negative for DVT's 12/11/22  WEIGHT BEARING RESTRICTIONS: No  FALLS:  Has patient fallen in last 6 months? 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  DIAGNOSTIC FINDINGS: 12/11/22: Venous Img Lower L leg; Directed duplex of the left lower extremity negative for DVT   ELBOW MRI IMPRESSION  10/6: 1. Partial tearing, tendinopathy, and peritendinitis of the distal biceps tendon particularly along the long head contribution. No overt rupture. 2. Thinning or partial tearing of the proximal lateral ulnar collateral ligament. 3. Spurring of the radial head and capitellum.     MUSCLE LENGTH: Hamstrings: Right 65 deg; Left 60 deg Thomas test:not tested - tight hip flexors to palpation   POSTURE: rounded shoulders, forward head, flexed trunk , and weight shift right  PALPATION: Eval: Tenderness and tightness to palpation L > R hip flexors; posterior hip; gastroc-soleus complex   01/23/23: Rt ASIS and iliac crest elevated in standing; TTP Rt lumbar paraspinals, QL  11/26 tender to R proximal and distal biceps, common extensor tendon   LOWER EXTREMITY ROM: tight L > R hip Active ROM Right eval Left eval 01/23/23 01/30/23  Hip flexion      Hip extension      Hip abduction      Hip adduction      Hip internal rotation      Hip external rotation      Knee flexion      Knee extension      Ankle dorsiflexion 9 5 Lt: 10; Rt: 14   Ankle plantarflexion      Ankle inversion 38 35    Ankle eversion 28 21 pain anterior leg     Great toe passive extension     Lt: 55; Rt: 70    (Blank rows = not tested)  LOWER EXTREMITY MMT: MMT Right eval Left eval Right 01/01/23 Left 01/01/23 01/23/23   Hip flexion 4+ 4+ 4+ 4+ 5 bilateral   Hip extension 3+ 3+ 5 5 5  bilateral   Hip abduction 4 4- 5 4+ Lt: 4+; Rt: 4  Hip adduction   5 4   Hip internal rotation       Hip external rotation       Knee flexion 5 5     Knee extension 5 4- 5 5   Ankle dorsiflexion 4 4- 5 5   Ankle plantarflexion       Ankle inversion 5 5-     Ankle eversion 5 5-      (Blank rows = not tested) LUMBAR ROM:   Active  A/PROM  01/23/23  Flexion Full   Extension 25% limited   Right lateral flexion 25% limited pull  Left lateral flexion 25% limited pull   Right rotation 25% limited tight  Left rotation 25% limited tight    (Blank rows = not tested)  UPPER EXTREMITY MMT:  11/26  MMT Right eval Left eval 02/18/23 Right   Shoulder flexion 4 4-   Shoulder extension 4+ 5   Shoulder abduction 4- 4-   Shoulder adduction     Shoulder extension     Shoulder internal rotation 4 5    Shoulder external rotation 4- 5   Middle trapezius     Lower trapezius     Elbow flexion 5  5 pain with neutral and supinated positioning   Elbow extension     Wrist flexion 5    Wrist extension 5    Wrist ulnar deviation     Wrist radial deviation     Wrist pronation     Wrist supination     Grip strength 44 44    (Blank rows = not tested)   UPPER EXTREMITY ROM:  11/26  A/P ROM Right eval Left eval  Shoulder flexion Full   Shoulder extension Full   Shoulder abduction Full   Shoulder internal rotation 85/120   Shoulder external rotation 57/63   Elbow flexion full   Elbow extension full   Wrist flexion    Wrist extension    Wrist ulnar deviation    Wrist radial deviation    Wrist pronation full   Wrist supination 60 62   (Blank rows = not tested)   SPECIAL TESTS:  11/26  painful arc with shoulder flexion in standing (negative in supine), else negative special tests  FUNCTIONAL TESTS:  5 times sit to stand: 10.41 sec use of UE's for momentum   GAIT: Distance walked: 40 feet  Assistive device utilized: None Level of assistance: Complete Independence Comments: decreased wt bearing with notable limp with stance to toe off on L   [facility] Adult PT Treatment:                                                DATE: 02/20/23 Therapeutic Exercise: UBE level 3 x 2 min each fwd/bwd Forearm flexor/extensor stretch 2 x 30 sec each  Resisted supination/pronation with yellow therabar 2 x 10  Supination with 2# on therabar 2 x 10  Standing shoulder flexion 4# 2 x 10  Resisted shoulder extension starting from overhead with red band (mimic overhead serve) 2 x 10  90/90 resisted shoulder IR 2 x 10; green band  Hammer curl 2 x 10 @ 4 lbs; pain with supinated grip so discontinued  Body blade elbow at 90 degrees thumb up and pronated positioning 2 x 20 sec each    [facility] Adult PT Treatment:                                                DATE: 02/18/23 Therapeutic Exercise: NuStep level 6  x 5 minutes UE/LE  Walking 1 lap in clinic with tape donned  3 way elbow flexion with silicone cupping x 10 each  Standing shoulder flexion with silicone cupping x 10  Standing shoulder abduction with silicone cupping x 10  Standing shoulder scaption with silicone cupping x 10  Supination AROM with silicone cupping x 10  Eccentric bicep curl 3# x 10 each 3 way grip  Manual Therapy: Heel cup taping to Lt heel   Self Care: Potential benefit of  gel heel cup and where to purchase Pain free UE strengthening with personal trainer, hold on tennis   [facility] Adult PT Treatment:                                                DATE: 02/10/23 Re-evaluation Elbow pain Therapeutic Exercise: R shoulder IR and ER x 15 RTB Standing 3 way raise x 15 RTB - flex/scap/ABD Standing Rows Blue TB x 15 Standing shoulder ext Blue TB x 15 Seated R biceps curl 4# x 15 3 sec up/3 sec hold/3 sec eccentric lower Seated R supination/pronation 4# x 15  PATIENT EDUCATION:  Education details: HEP review; see impression  Person educated: Patient Education method: Explanation,  Education comprehension: verbalized understanding,   HOME EXERCISE PROGRAM: Access Code: [email or web address] URL: [email or web address] Date: 01/30/2023 Prepared by: Letitia Libra  Exercises - Supine Piriformis Stretch with Leg Straight  - 2 x daily - 7 x weekly - 1 sets - 3 reps - 30 sec  hold - Hooklying Hamstring Stretch with Strap  - 2 x daily - 7 x weekly - 1 sets - 3 reps - 30 sec  hold - Supine ITB Stretch with Strap  - 2 x daily - 7 x weekly - 1 sets - 3 reps - 30 sec  hold - Gastroc Stretch on Wall  - 2 x daily - 7 x weekly - 1 sets - 3 reps - 30 sec  hold - Soleus Stretch on Wall  - 2 x daily - 7 x weekly - 1 sets - 3 reps - 30 sec  hold - Supine Quadriceps Stretch with Strap on Table  - 2 x daily - 7 x weekly - 1 sets - 3 reps - 30-60 sec hold - Standing Eccentric Heel Raise  - 1 x daily - 7 x weekly - 3 sets - 10 reps - 3  sec hold - Single Leg Stance  - 1 x daily - 7 x weekly - 2 sets - 3-5 reps - 20 sec hold - The Diver  - 1 x daily - 7 x weekly - 1 sets - 10 reps - 2-3 sec  hold - Towel Scrunches  - 1 x daily - 7 x weekly - 1 sets - 3 reps - 30 sec  hold - Toe Yoga - Alternating Great Toe and Lesser Toe Extension  - 1 x daily - 7 x weekly - 1 sets - 10 reps - 3 sec  hold - Arch Lifting  - 1 x daily - 7 x weekly - 1 sets - 10-15 reps - 2-3 sec  hold - sidelying low back stretch  - 1 x daily - 7 x weekly - 1 sets - 3 reps - 30 seconds hold - Seated Self Great Toe Stretch  - 1 x daily - 7 x weekly - 3 sets - 30 sec  hold  Access Code: [email or web address] BACK AND ELBOW  URL: [email or web address] Date: 02/18/2023 Prepared by: Letitia Libra  Exercises - Supine Lower Trunk Rotation  - 1 x daily - 7 x weekly - 2 sets - 10 reps - Cat Cow  - 1 x daily - 7 x weekly - 2 sets - 10 reps - Sidelying Thoracic Rotation with Open Book  - 1 x daily - 7 x weekly - 2 sets - 10 reps - Standing Quadratus  Lumborum Stretch with Doorway  - 1 x daily - 7 x weekly - 3 sets - 30 sec hold - Supine Pelvic Tilt  - 1 x daily - 7 x weekly - 2 sets - 10 reps - Seated 3 Way Exercise Ball Roll Out Stretch  - 1 x daily - 7 x weekly - 2 sets - 10 reps - Shoulder External Rotation with Anchored Resistance  - 1 x daily - 7 x weekly - 1-3 sets - 10 reps - Shoulder Internal Rotation with Resistance  - 1 x daily - 7 x weekly - 1-3 sets - 10 reps - Standing Single Arm Shoulder Flexion with Resistance  - 1 x daily - 3-4 x weekly - 3 sets - 10 reps - Standing Single Arm Shoulder Abduction with Resistance  - 1 x daily - 3-4 x weekly - 3 sets - 10 reps - Standing Single Arm Shoulder Abduction with Resistance  - 1 x daily - 3-4 x weekly - 3 sets - 10 reps - Standing Shoulder Row with Anchored Resistance  - 1 x daily - 3-4 x weekly - 3 sets - 10 reps - Shoulder extension with resistance - Neutral  - 1 x daily - 3-4 x weekly - 3 sets - 10 reps -  Seated Single Arm Elbow Flexion with Dumbbell  - 1 x daily - 3-4 x weekly - 3 sets - 10 reps - 3 sec  hold   ASSESSMENT:  CLINICAL IMPRESSION: Focused mostly on the Rt elbow today with good tolerance. No pain with resisted supination and shoulder flexion strengthening today. Pain/pulling with resisted elbow flexion in supinated postioning, but no pain in neutral position. Incorporated strengthening in sport specific motions to mimic serving without onset of pain. She was instructed to begin light overhead serving as long as pain free for short distances/moderate speed prior to next visit with patient verbalizing understanding.      Re-evaluation completed for elbow: Kischa presents with reports of R elbow pain since March of this year. Pain has improved, but she still is limited with lifting, reaching backwards and typing. She demonstrates full elbow ROM and strength, but does have significant shoulder weakness B. She has tenderness in the LH biceps tendon and in the biceps muscle belly as well as the common extensor tendons. She will benefit from skilled PT to address these deficits.  Good tolerance to initial TE today except reports of some shoulder pain with end range resisted ABDuction.   Re-evaluation completed as patient with updated referral for acute right-sided low back pain without sciatica. She reports initial onset of pain while playing tennis a couple months ago, but muscle spasm began in her back about a week ago. Overall her pain has improved, but has not returned to her previous level of activity prior to this acute flare up. She is noted to have limited lateral trunk flexion and rotation AROM with noted tightness/pulling with these movements. She has palpable tenderness about Rt lumbar musculature and postural abnormalities. HEP was issued to include trunk mobility She will benefit from continued skilled PT to address the above stated deficits in order to optimize her function and  assist in overall pain reduction.   Eval: Patient is a 52 y.o. female who was seen today for physical therapy evaluation and treatment for L leg pain. She reports initial pain in the L knee and into L calf area at least 7 months ago. She had PT for the knee and dry needling for  the calf with some improvement but then experienced pain in the L calf again beginning in June with symptoms increasing in the past month. Patient has no know injury. She does have a history of DVT's in 2003 and 2021. [place] 9/24 negative for DVTs. Patient presents with abnormal gait pattern; muscular tightness to palpation; decreased ROM; weakness in L > R hips; knees and ankles. Patient will benefit from comprehensive PT program to address LE symptoms.   OBJECTIVE IMPAIRMENTS: Abnormal gait, decreased activity tolerance, decreased ROM, decreased strength, hypomobility, impaired flexibility, improper body mechanics, postural dysfunction, obesity, and pain.    GOALS: Goals reviewed with patient? Yes  SHORT TERM GOALS: Target date: 01/29/2023  Independent in initial HEP  Baseline: Goal status: MET  2.  Increase AROM L bilat ankle DF by 3-5 degrees  Baseline:  Goal status: MET  LONG TERM GOALS: Target date: 03/21/23  Decrease pain L calf by 75-100% allowing patient to return to all normal functional activities  Baseline:  Goal status: INITIAL  2.  5/5 strength bilat LE's  Baseline:  Goal status: INITIAL  3.  Ambulation for 20-30 min with pain no greater than 1-2/10 and no limp  Baseline:  Goal status: INITIAL  4.  Patient returns to full functional and recreational activities including return to tennis  Baseline:  Goal status: INITIAL  5.  Independent in HEP, including aquatic therapy as indicated  Baseline:  Goal status: INITIAL  6.  Improve functional limitation 60  Baseline: 48 Goal status: INITIAL  6. Patient will demonstrate full and pain free trunk AROM to improve ability to complete reaching and  bending activity.  Baseline: see above Goal status: NEW  7.  Increased R shoulder strength to 5/5 Baseline:  Goal status: NEW  8. Decreased R elbow and biceps pain by 85% with ADLs to improve QOL. Baseline:  Goal status: NEW   PLAN:  PT FREQUENCY: 2x/week  PT DURATION: 8 weeks  PLANNED INTERVENTIONS: 97164- PT Re-evaluation, 97110-Therapeutic exercises, 97530- Therapeutic activity, [id number]- Neuromuscular re-education, 97535- Self Care, 63875- Manual therapy, [id number]- Gait training, [id number]- Aquatic Therapy, 97014- Electrical stimulation (unattended), [id number]- Electrical stimulation (manual), [id number]- Vasopneumatic device, [id number]- Traction (mechanical), [id number]- Ionotophoresis 4mg /ml Dexamethasone, Taping, Dry Needling, Cryotherapy, and Moist heat.  PLAN FOR NEXT SESSION: Elbow: DN/MT to R biceps, common extensor tendon, R shoulder strength, biceps eccentric focus, grip/putty. Single leg dynamic activities and lateral movement in preparation for return to tennis; DN and STM/IASTM for calf L/R as needed; Progress eccentric and intrinsic foot strengthening. Trunk mobility; core strengthening  Letitia Libra, PT, DPT, ATC 02/20/23 11:00 AM

## 2023-02-25 ENCOUNTER — Encounter: Payer: BC Managed Care – PPO | Admitting: Rehabilitative and Restorative Service Providers"

## 2023-02-27 ENCOUNTER — Encounter: Payer: Self-pay | Admitting: Family Medicine

## 2023-02-27 ENCOUNTER — Ambulatory Visit: Payer: BC Managed Care – PPO

## 2023-02-27 DIAGNOSIS — M5459 Other low back pain: Secondary | ICD-10-CM | POA: Diagnosis not present

## 2023-02-27 DIAGNOSIS — M79662 Pain in left lower leg: Secondary | ICD-10-CM | POA: Diagnosis not present

## 2023-02-27 DIAGNOSIS — R2689 Other abnormalities of gait and mobility: Secondary | ICD-10-CM | POA: Diagnosis not present

## 2023-02-27 DIAGNOSIS — R29898 Other symptoms and signs involving the musculoskeletal system: Secondary | ICD-10-CM

## 2023-02-27 DIAGNOSIS — M6281 Muscle weakness (generalized): Secondary | ICD-10-CM | POA: Diagnosis not present

## 2023-02-27 DIAGNOSIS — M25521 Pain in right elbow: Secondary | ICD-10-CM

## 2023-02-27 NOTE — Therapy (Signed)
OUTPATIENT PHYSICAL THERAPY TREATMENT     Patient Name: Kristen Meyers MRN: [id number] DOB:Aug 06, 1970, 52 y.o., female Today's Date: 02/27/2023  END OF SESSION:  PT End of Session - 02/27/23 1102     Visit Number 18    Number of Visits 26    Date for PT Re-Evaluation 03/21/23    Authorization Type [employer] copay 40    Authorization Time Period year - 65 remaing for year per TL    PT Start Time 1101    PT Stop Time 1141    PT Time Calculation (min) 40 min    Activity Tolerance Patient tolerated treatment well    Behavior During Therapy WFL for tasks assessed/performed                   Past Medical History:  Diagnosis Date   Achilles tendonosis of left lower extremity    Acute DVT (deep venous thrombosis) (HCC)    DVT 2012-january, L leg, in the context of post-C section. Again 2021, L leg, dx'd shortly after having covid infection->took eliquis x 35mo.   Cholelithiasis without obstruction 09/19/09 (u/s)   Chronic elbow pain, right    Dr. Karie Schwalbe, MRI 2024->tendonopathy/partial tear of distal biceps tendon, thinning or partial tear of the lateral ulnar collateral ligament, spurring of the radial head and capitellum.   COVID-19 virus infection    approx 11/09/19---got antibody infusion   Diabetes mellitus without complication (HCC) 03/21/2010   Elevated transaminase level 07/19/2012   Essential hypertension 03/21/2010   Qualifier: Diagnosis of   By: Gabriel Rung LPN, Enid Baas liver 09/19/09 (u/s)   Mildly elevated transaminases   GERD 03/21/2010   History of adenomatous polyp of colon 12/2018   2020.  Normal 01/14/22. 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  01/14/22 no polyps. Recall 5 yrs.   Coronary calcium score  10/2022   Score 26, 90th%'tile   LAPAROSCOPIC HYSTERECTOMY  02/2014   Ovaries remain (tubes out): done for DUB   TRANSTHORACIC ECHOCARDIOGRAM     11/2022 EF 55-60%, grd I DD, AV sclerosis w/out stenosis.   TUBAL LIGATION  2011   Dr. Malachy Mood is her GYN.   WISDOM TOOTH EXTRACTION     Patient Active Problem List   Diagnosis Date Noted   Chronic elbow pain, right 12/11/2022   Left leg pain 12/11/2022   Chest pain of uncertain etiology 10/24/2022   Cardiac murmur 10/24/2022   Acute DVT (deep venous thrombosis) (HCC)    Cholelithiasis without obstruction    COVID-19 virus infection    Fatty liver    Obesity, Class II, BMI 35-39.9    Seizures (HCC)    History of laparoscopic-assisted vaginal hysterectomy 02/14/2020  Deep venous thrombosis (HCC) 02/14/2020   Subclinical hypothyroidism 2021   Melanocytic nevi, unspecified 12/30/2018   History of adenomatous polyp of colon 12/2018   Diabetes mellitus without complication (HCC) 06/22/2015   Health maintenance examination 07/20/2013   NAFLD (nonalcoholic fatty liver disease) 46/96/2952   Obesity, morbid (HCC) 10/19/2012   Paresthesia of left arm 09/30/2012   Low back pain 09/30/2012   Left shoulder pain 09/30/2012   Type II or unspecified type diabetes mellitus without mention of complication, uncontrolled 07/19/2012   Elevated transaminase level 07/19/2012   History of herpes zoster 03/2012   Hyperlipidemia 03/21/2010   Essential hypertension 03/21/2010   GERD 03/21/2010    PCP: Dr Earley Favor  REFERRING PROVIDER:  Dr Thornell Mule, Maryjean Morn,  MD   REFERRING DIAG: L leg pain (Dr. Benjamin Stain)   M54.50 (ICD-10-CM) - Acute right-sided low back pain without sciatica (Dr. Milinda Cave)   M25.521,G89.29 (ICD-10-CM) - Chronic elbow pain, right   THERAPY DIAG:  Pain in right elbow  Muscle weakness (generalized)  Other symptoms and signs involving the musculoskeletal system  Other abnormalities of gait and mobility  Other low back pain  Pain of left calf  Rationale for Evaluation and Treatment: Rehabilitation  ONSET DATE: 08/16/22  SUBJECTIVE:   SUBJECTIVE STATEMENT: Patient did some tennis serving and swinging since last visit and was sore in the shoulder the first time, but not the second time.     ELBOW EVAL: She has been having right elbow pain since March. It's gotten a little better. MRI shows biceps tear. It's gotten better. If pick up something top heavy or too far behind me it hurts and it is weaker. Can't do normal work out. And it was starting to hurt with some tennis swings. She can lift hair dryer now. Typing too long hurts.  BACK EVAL: Patient with new referral for her back. Patient had a knot in the back for a couple months and aggravated it with weight training and then it began to spasm while in PT about a week ago. She does recall swinging her tennis racquet and felt a twinge in the back as her initial cause of pain. She kept playing when this occurred in September. She has noticed the knot before and spasms previously, but not to the extent of her recent pain. Patient reports her back is feeling better since last week. She did not have to take any muscle relaxers yesterday and did a mild workout without issues. Continues to take anti-inflammatories. No red flag symptoms. She has still not returned to tennis.   Eval: Patient reports that she has had L leg pain for several months with no known injury. Symptoms have increased in the past month. She has pain and inflammation in the L leg on constant basis now. Pain was  intermittent prior to the past month. Had pain in the L knee 3/24 "floating knee cap", then L calf pain. Went to PT with some improvement in the knee with exercise and DN but she stated having pain again 6/24  PERTINENT HISTORY: AODM; HTN; elevated cholesterol; knee pain bilat; R elbow pain for ~ past 6-7 months did PT for tennis elbow with some improvement then symptoms started getting worse; history ot L LE DVT's 2003 and 2021  PAIN:  Are you having pain? Yes: NPRS scale: none currently; at worst 3/10 Pain location: Lt achilles  Pain description: tightness Aggravating factors: tennis; driving; standing; walking; sitting at desk or upright position Relieving factors:  recliner; OTC meds; ice  PAIN:  Are you having pain? Yes: NPRS scale: none currently; at worst 3/10 Pain location: lower biceps proximal elbow Pain description: achy Aggravating factors: lifting, typing, reaching behind her Relieving factors: uses nitro patch, stretches    PRECAUTIONS: Other: history ot L LE DVT's 2003 and 2021 - [place] negative for DVT's 12/11/22  WEIGHT BEARING RESTRICTIONS: No  FALLS:  Has patient fallen in last 6 months? 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  DIAGNOSTIC FINDINGS: 12/11/22: Venous Img Lower L leg; Directed duplex of the left lower extremity negative for DVT   ELBOW MRI IMPRESSION  10/6: 1. Partial tearing, tendinopathy, and peritendinitis of the distal biceps tendon particularly along the long head contribution. No overt rupture. 2. Thinning or partial tearing of the proximal lateral ulnar collateral ligament. 3. Spurring of the radial head and capitellum.     MUSCLE LENGTH: Hamstrings: Right 65 deg; Left 60 deg Thomas test:not tested - tight hip flexors to palpation   POSTURE: rounded shoulders, forward head, flexed trunk , and weight shift right  PALPATION: Eval: Tenderness and tightness to palpation L > R hip flexors; posterior hip; gastroc-soleus complex   01/23/23: Rt ASIS and iliac crest elevated in standing; TTP Rt lumbar paraspinals, QL  11/26 tender to R proximal and distal biceps, common extensor tendon   LOWER EXTREMITY ROM: tight L > R hip Active ROM Right eval Left eval 01/23/23 01/30/23  Hip flexion      Hip extension      Hip abduction      Hip adduction      Hip internal rotation      Hip external rotation      Knee flexion      Knee extension      Ankle dorsiflexion 9 5 Lt: 10; Rt: 14   Ankle plantarflexion      Ankle inversion 38 35    Ankle eversion 28 21 pain anterior leg     Great toe passive extension     Lt: 55; Rt: 70    (Blank rows = not tested)  LOWER EXTREMITY MMT: MMT Right eval Left eval Right 01/01/23 Left 01/01/23 01/23/23   Hip flexion 4+ 4+ 4+ 4+ 5 bilateral   Hip extension 3+ 3+ 5 5 5  bilateral   Hip abduction 4 4- 5 4+ Lt: 4+; Rt: 4  Hip adduction   5 4   Hip internal rotation       Hip external rotation       Knee flexion 5 5     Knee extension 5 4- 5 5   Ankle dorsiflexion 4 4- 5 5   Ankle plantarflexion       Ankle inversion 5 5-     Ankle eversion 5 5-      (Blank rows = not tested) LUMBAR ROM:   Active  A/PROM  01/23/23  Flexion Full   Extension 25% limited   Right lateral flexion 25% limited pull  Left lateral flexion 25% limited pull   Right rotation 25% limited tight  Left rotation 25% limited tight    (Blank rows = not tested)  UPPER EXTREMITY MMT:  11/26  MMT Right eval Left eval 02/18/23 Right  02/27/23 Right   Shoulder flexion 4 4-    Shoulder extension 4+ 5    Shoulder abduction 4- 4-    Shoulder adduction      Shoulder extension      Shoulder  internal rotation 4 5    Shoulder external rotation 4- 5    Middle trapezius      Lower trapezius      Elbow flexion 5  5 pain with neutral and supinated positioning  5 pain in extensor mass in neutral, pain in bicep in supinated   Elbow extension      Wrist flexion 5     Wrist extension 5     Wrist ulnar deviation      Wrist radial deviation      Wrist pronation      Wrist supination      Grip strength 44 44     (Blank rows = not tested)   UPPER EXTREMITY ROM:  11/26  A/P ROM Right eval Left eval  Shoulder flexion Full   Shoulder extension Full   Shoulder abduction Full   Shoulder internal rotation 85/120   Shoulder external rotation 57/63   Elbow flexion full   Elbow extension full   Wrist flexion    Wrist extension    Wrist ulnar deviation    Wrist radial deviation    Wrist pronation full   Wrist supination 60 62   (Blank rows = not tested)   SPECIAL TESTS:  11/26  painful arc with shoulder flexion in standing (negative in supine), else negative special tests  FUNCTIONAL TESTS:  5 times sit to stand: 10.41 sec use of UE's for momentum   GAIT: Distance walked: 40 feet  Assistive device utilized: None Level of assistance: Complete Independence Comments: decreased wt bearing with notable limp with stance to toe off on L  [facility] Adult PT Treatment:                                                DATE: 02/27/23 Therapeutic Exercise: UBE level 2 x 2 min each fwd/bwd  Forearm extensor stretch 2 x 30 sec  Putty gripping in neutral and supinated x 10 each  Eccentric wrist extension 2 x 10 @ 3 lbs  90/90 wall ball toss red med ball 3 x 20  Resisted D2 extension green band 2 x 10  Eccentric shoulder flexion 2 x 10 @ 3 lbs  Wall ball circles red med ball 2 x 20 each CW/CCW flexion  Updated HEP   Self Care: Progress tennis serving on court from half court, forehand swinging at half court   [facility] Adult PT Treatment:                                                DATE:  02/20/23 Therapeutic Exercise: UBE level 3 x 2 min each fwd/bwd Forearm flexor/extensor stretch 2 x 30 sec each  Resisted supination/pronation with yellow therabar 2 x 10  Supination with 2# on therabar 2 x 10  Standing shoulder flexion 4# 2 x 10  Resisted shoulder extension starting from overhead with red band (mimic overhead serve) 2 x 10  90/90 resisted shoulder IR 2 x 10; green band  Hammer curl 2 x 10 @ 4 lbs; pain with supinated grip so discontinued  Body blade elbow at 90 degrees thumb up and pronated positioning 2 x 20 sec each    [facility] Adult PT Treatment:                                                DATE: 02/18/23 Therapeutic Exercise: NuStep level 6 x 5 minutes UE/LE  Walking 1 lap in clinic with tape donned  3 way elbow flexion with silicone cupping x 10 each  Standing shoulder flexion with silicone cupping x 10  Standing shoulder abduction with silicone cupping x 10  Standing shoulder scaption with silicone cupping x 10  Supination AROM with silicone cupping x 10  Eccentric bicep curl 3# x 10 each 3 way grip  Manual Therapy: Heel cup taping to Lt heel   Self Care: Potential benefit of gel heel cup and where to purchase Pain free UE strengthening with personal trainer, hold on tennis   [facility] Adult PT Treatment:                                                DATE: 02/10/23 Re-evaluation Elbow pain Therapeutic Exercise: R shoulder IR and ER x 15 RTB Standing 3 way raise x 15 RTB - flex/scap/ABD Standing Rows Blue TB x 15 Standing shoulder ext Blue TB x 15 Seated R biceps curl 4# x 15 3 sec up/3 sec hold/3 sec eccentric lower Seated R supination/pronation 4# x 15  PATIENT EDUCATION:  Education details: HEP review; see impression  Person educated: Patient Education method: Explanation,  Education comprehension: verbalized understanding,   HOME EXERCISE PROGRAM: Access Code: [email or web address] URL: [email or web address] Date: 01/30/2023 Prepared by: Letitia Libra  Exercises - Supine Piriformis Stretch with Leg Straight  - 2 x daily - 7 x weekly - 1 sets - 3 reps - 30 sec  hold - Hooklying Hamstring Stretch with Strap  - 2 x daily - 7 x weekly - 1 sets - 3 reps - 30 sec  hold - Supine ITB Stretch with Strap  - 2 x daily - 7 x weekly - 1 sets - 3 reps - 30 sec  hold - Gastroc Stretch on Wall  - 2 x daily - 7 x weekly - 1 sets - 3 reps - 30 sec  hold - Soleus Stretch on Wall  - 2 x daily - 7 x weekly - 1 sets - 3 reps - 30 sec  hold - Supine Quadriceps Stretch with Strap on Table  - 2 x daily - 7 x weekly - 1 sets - 3 reps - 30-60 sec hold - Standing Eccentric Heel Raise  - 1 x daily - 7 x weekly - 3 sets - 10 reps - 3 sec hold - Single Leg Stance  - 1 x daily - 7 x weekly - 2 sets - 3-5 reps - 20 sec hold - The Diver  - 1 x daily - 7 x weekly - 1 sets - 10 reps - 2-3 sec  hold - Towel Scrunches  - 1 x daily - 7 x weekly - 1 sets - 3  reps - 30 sec  hold - Toe Yoga - Alternating Great Toe and Lesser Toe Extension  - 1 x daily - 7 x weekly - 1 sets - 10 reps - 3 sec  hold - Arch Lifting  - 1 x daily - 7 x weekly - 1 sets - 10-15 reps - 2-3 sec  hold - sidelying low back stretch  - 1 x daily - 7 x weekly - 1 sets - 3 reps - 30 seconds hold - Seated Self Great Toe Stretch  - 1 x daily - 7 x weekly - 3 sets - 30 sec  hold  Access Code: [email or web address] BACK AND ELBOW  URL: [email or web address] Date: 02/27/2023 Prepared by: Letitia Libra  Exercises - Supine Lower Trunk Rotation  - 1 x daily - 7 x weekly - 2 sets - 10 reps - Cat Cow  - 1 x daily - 7 x weekly - 2 sets - 10 reps - Sidelying Thoracic Rotation with Open Book  - 1 x daily - 7 x weekly - 2 sets - 10 reps - Standing Quadratus Lumborum Stretch with Doorway  - 1 x daily - 7 x weekly - 3 sets - 30 sec hold - Supine Pelvic Tilt  - 1 x daily - 7 x weekly - 2 sets - 10 reps - Seated 3 Way Exercise Ball Roll Out Stretch  - 1 x daily - 7 x weekly - 2 sets - 10 reps - Shoulder External  Rotation with Anchored Resistance  - 1 x daily - 7 x weekly - 1-3 sets - 10 reps - Shoulder Internal Rotation with Resistance  - 1 x daily - 7 x weekly - 1-3 sets - 10 reps - Standing Single Arm Shoulder Flexion with Resistance  - 1 x daily - 3-4 x weekly - 3 sets - 10 reps - Standing Single Arm Shoulder Abduction with Resistance  - 1 x daily - 3-4 x weekly - 3 sets - 10 reps - Standing Single Arm Shoulder Abduction with Resistance  - 1 x daily - 3-4 x weekly - 3 sets - 10 reps - Standing Shoulder Row with Anchored Resistance  - 1 x daily - 3-4 x weekly - 3 sets - 10 reps - Shoulder extension with resistance - Neutral  - 1 x daily - 3-4 x weekly - 3 sets - 10 reps - Seated Single Arm Elbow Flexion with Dumbbell  - 1 x daily - 3-4 x weekly - 3 sets - 10 reps - 3 sec  hold - Putty Squeezes  - 1 x daily - 7 x weekly - 2 sets - 10 reps - Seated Eccentric Wrist Extension  - 1 x daily - 3 x weekly - 2 sets - 10 reps   ASSESSMENT:  CLINICAL IMPRESSION: Patient continues to have 5/5 Rt elbow flexor strength, but does experience pain in the bicep with MMT in supinated positioning and pain in forearm extensor mass in neutral positioning. Added in grip strengthening and progressed shoulder/elbow strengthening today without onset of elbow pain. We discussed tennis progression to include serving and hitting from half court with patient verbalizing understanding.      Re-evaluation completed for elbow: Delsia presents with reports of R elbow pain since March of this year. Pain has improved, but she still is limited with lifting, reaching backwards and typing. She demonstrates full elbow ROM and strength, but does have significant shoulder weakness B. She has tenderness in the LH biceps tendon and in  the biceps muscle belly as well as the common extensor tendons. She will benefit from skilled PT to address these deficits.  Good tolerance to initial TE today except reports of some shoulder pain with end range  resisted ABDuction.   Re-evaluation completed as patient with updated referral for acute right-sided low back pain without sciatica. She reports initial onset of pain while playing tennis a couple months ago, but muscle spasm began in her back about a week ago. Overall her pain has improved, but has not returned to her previous level of activity prior to this acute flare up. She is noted to have limited lateral trunk flexion and rotation AROM with noted tightness/pulling with these movements. She has palpable tenderness about Rt lumbar musculature and postural abnormalities. HEP was issued to include trunk mobility She will benefit from continued skilled PT to address the above stated deficits in order to optimize her function and assist in overall pain reduction.   Eval: Patient is a 52 y.o. female who was seen today for physical therapy evaluation and treatment for L leg pain. She reports initial pain in the L knee and into L calf area at least 7 months ago. She had PT for the knee and dry needling for the calf with some improvement but then experienced pain in the L calf again beginning in June with symptoms increasing in the past month. Patient has no know injury. She does have a history of DVT's in 2003 and 2021. [place] 9/24 negative for DVTs. Patient presents with abnormal gait pattern; muscular tightness to palpation; decreased ROM; weakness in L > R hips; knees and ankles. Patient will benefit from comprehensive PT program to address LE symptoms.   OBJECTIVE IMPAIRMENTS: Abnormal gait, decreased activity tolerance, decreased ROM, decreased strength, hypomobility, impaired flexibility, improper body mechanics, postural dysfunction, obesity, and pain.    GOALS: Goals reviewed with patient? Yes  SHORT TERM GOALS: Target date: 01/29/2023  Independent in initial HEP  Baseline: Goal status: MET  2.  Increase AROM L bilat ankle DF by 3-5 degrees  Baseline:  Goal status: MET  LONG TERM GOALS:  Target date: 03/21/23  Decrease pain L calf by 75-100% allowing patient to return to all normal functional activities  Baseline:  Goal status: INITIAL  2.  5/5 strength bilat LE's  Baseline:  Goal status: INITIAL  3.  Ambulation for 20-30 min with pain no greater than 1-2/10 and no limp  Baseline:  Goal status: INITIAL  4.  Patient returns to full functional and recreational activities including return to tennis  Baseline:  Goal status: INITIAL  5.  Independent in HEP, including aquatic therapy as indicated  Baseline:  Goal status: INITIAL  6.  Improve functional limitation 60  Baseline: 48 Goal status: INITIAL  6. Patient will demonstrate full and pain free trunk AROM to improve ability to complete reaching and bending activity.  Baseline: see above Goal status: NEW  7.  Increased R shoulder strength to 5/5 Baseline:  Goal status: NEW  8. Decreased R elbow and biceps pain by 85% with ADLs to improve QOL. Baseline:  Goal status: NEW   PLAN:  PT FREQUENCY: 2x/week  PT DURATION: 8 weeks  PLANNED INTERVENTIONS: 97164- PT Re-evaluation, 97110-Therapeutic exercises, 97530- Therapeutic activity, [id number]- Neuromuscular re-education, 97535- Self Care, 16109- Manual therapy, [id number]- Gait training, [id number]- Aquatic Therapy, 97014- Electrical stimulation (unattended), [id number]- Electrical stimulation (manual), [id number]- Vasopneumatic device, [id number]- Traction (mechanical), [id number]- Ionotophoresis 4mg /ml Dexamethasone, Taping, Dry Needling, Cryotherapy, and Moist heat.  PLAN FOR NEXT SESSION: Elbow: DN/MT to R biceps, common extensor tendon, R shoulder strength, biceps eccentric focus, grip/putty. Single leg dynamic activities and lateral movement in preparation for return to tennis; DN and STM/IASTM for calf L/R as needed; Progress eccentric and intrinsic foot strengthening. Trunk mobility; core strengthening  Letitia Libra, PT, DPT, ATC 02/27/23 11:49 AM

## 2023-03-02 ENCOUNTER — Ambulatory Visit: Payer: BC Managed Care – PPO | Admitting: Physical Therapy

## 2023-03-02 ENCOUNTER — Encounter: Payer: Self-pay | Admitting: Physical Therapy

## 2023-03-02 DIAGNOSIS — M79662 Pain in left lower leg: Secondary | ICD-10-CM | POA: Diagnosis not present

## 2023-03-02 DIAGNOSIS — M25521 Pain in right elbow: Secondary | ICD-10-CM | POA: Diagnosis not present

## 2023-03-02 DIAGNOSIS — M5459 Other low back pain: Secondary | ICD-10-CM | POA: Diagnosis not present

## 2023-03-02 DIAGNOSIS — R2689 Other abnormalities of gait and mobility: Secondary | ICD-10-CM

## 2023-03-02 DIAGNOSIS — M6281 Muscle weakness (generalized): Secondary | ICD-10-CM

## 2023-03-02 DIAGNOSIS — R29898 Other symptoms and signs involving the musculoskeletal system: Secondary | ICD-10-CM | POA: Diagnosis not present

## 2023-03-02 NOTE — Therapy (Signed)
OUTPATIENT PHYSICAL THERAPY TREATMENT     Patient Name: Kristen Meyers MRN: [id number] DOB:[birth year], 52 y.o., female Today's Date: 03/02/2023  END OF SESSION:  PT End of Session - 03/02/23 0848     Visit Number 19    Number of Visits 26    Date for PT Re-Evaluation 03/21/23    Authorization Type [employer] copay 40    Authorization Time Period year - 45 remaing for year per TL    Authorization - Visit Number 14    Authorization - Number of Visits 60    PT Start Time 0848    PT Stop Time 0926    PT Time Calculation (min) 38 min    Activity Tolerance Patient tolerated treatment well    Behavior During Therapy WFL for tasks assessed/performed                    Past Medical History:  Diagnosis Date   Achilles tendonosis of left lower extremity    Acute DVT (deep venous thrombosis) (HCC)    DVT 2012-january, L leg, in the context of post-C section. Again 2021, L leg, dx'd shortly after having covid infection->took eliquis x 58mo.   Cholelithiasis without obstruction 09/19/09 (u/s)   Chronic elbow pain, right    Dr. Karie Schwalbe, MRI 2024->tendonopathy/partial tear of distal biceps tendon, thinning or partial tear of the lateral ulnar collateral ligament, spurring of the radial head and capitellum.   COVID-19 virus infection    approx 11/09/19---got antibody infusion   Diabetes mellitus without complication (HCC) 03/21/2010   Elevated transaminase level 07/19/2012   Essential hypertension 03/21/2010   Qualifier: Diagnosis of   By: Gabriel Rung LPN, Enid Baas liver 09/19/09 (u/s)   Mildly elevated transaminases   GERD 03/21/2010   History of adenomatous polyp of colon 12/2018   2020.  Normal 01/14/22. 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  01/14/22 no polyps. Recall 5 yrs.   Coronary calcium score  10/2022   Score 26, 90th%'tile   LAPAROSCOPIC HYSTERECTOMY  02/2014   Ovaries remain (tubes out): done for DUB   TRANSTHORACIC ECHOCARDIOGRAM     11/2022 EF 55-60%, grd I DD, AV sclerosis w/out stenosis.   TUBAL LIGATION  2011   Dr. Malachy Mood is her GYN.   WISDOM TOOTH EXTRACTION     Patient Active Problem List   Diagnosis Date Noted   Chronic elbow pain, right 12/11/2022   Left leg pain 12/11/2022   Chest pain of uncertain etiology 10/24/2022   Cardiac murmur 10/24/2022   Acute DVT (deep venous thrombosis) (HCC)    Cholelithiasis without obstruction    COVID-19 virus infection    Fatty liver    Obesity, Class  II, BMI 35-39.9    Seizures (HCC)    History of laparoscopic-assisted vaginal hysterectomy 02/14/2020   Deep venous thrombosis (HCC) 02/14/2020   Subclinical hypothyroidism 2021   Melanocytic nevi, unspecified 12/30/2018   History of adenomatous polyp of colon 12/2018   Diabetes mellitus without complication (HCC) 06/22/2015   Health maintenance examination 07/20/2013   NAFLD (nonalcoholic fatty liver disease) 47/42/5956   Obesity, morbid (HCC) 10/19/2012   Paresthesia of left arm 09/30/2012   Low back pain 09/30/2012   Left shoulder pain 09/30/2012   Type II or unspecified type diabetes mellitus without mention of complication, uncontrolled 07/19/2012   Elevated transaminase level 07/19/2012   History of herpes zoster 03/2012   Hyperlipidemia 03/21/2010   Essential hypertension 03/21/2010   GERD 03/21/2010    PCP:  Dr Earley Favor  REFERRING PROVIDER:  Dr Thornell Mule, Maryjean Morn, MD   REFERRING DIAG: L leg pain (Dr. Benjamin Stain)   M54.50 (ICD-10-CM) - Acute right-sided low back pain without sciatica (Dr. Milinda Cave)   M25.521,G89.29 (ICD-10-CM) - Chronic elbow pain, right   THERAPY DIAG:  Pain in right elbow  Muscle weakness (generalized)  Other symptoms and signs involving the musculoskeletal system  Other abnormalities of gait and mobility  Other low back pain  Pain of left calf  Rationale for Evaluation and Treatment: Rehabilitation  ONSET DATE: 08/16/22  SUBJECTIVE:   SUBJECTIVE STATEMENT: I played some tennis and it was good. She noticed that the grip on one of her rackets was smaller and this caused some discomfort. She plans to change it to the other racket's size. Still feeling some discomfort under lateral ankle, but may be due to previous injury.    ELBOW EVAL: She has been having right elbow pain since March. It's gotten a little better. MRI shows biceps tear. It's gotten better. If pick up something top heavy or too far behind me it hurts and it is weaker. Can't do normal work out. And it was starting to hurt with some tennis swings. She can lift hair dryer now. Typing too long hurts.  BACK EVAL: Patient with new referral for her back. Patient had a knot in the back for a couple months and aggravated it with weight training and then it began to spasm while in PT about a week ago. She does recall swinging her tennis racquet and felt a twinge in the back as her initial cause of pain. She kept playing when this occurred in September. She has noticed the knot before and spasms previously, but not to the extent of her recent pain. Patient reports her back is feeling better since last week. She did not have to take any muscle relaxers yesterday and did a mild workout without issues. Continues to take anti-inflammatories. No red flag symptoms. She has still not returned  to tennis.   Eval: Patient reports that she has had L leg pain for several months with no known injury. Symptoms have increased in the past month. She has pain and inflammation in the L leg on constant basis now. Pain was intermittent prior to the past month. Had pain in the L knee 3/24 "floating knee cap", then L calf pain. Went to PT with some improvement in the knee with exercise and DN but she stated having pain again 6/24  PERTINENT HISTORY: AODM; HTN; elevated cholesterol; knee pain bilat; R elbow pain for ~ past 6-7 months did PT for tennis elbow with some improvement then symptoms started getting worse; history ot  L LE DVT's 2003 and 2021  PAIN:  Are you having pain? Yes: NPRS scale: none currently; at worst 3/10 Pain location: Lt achilles  Pain description: tightness Aggravating factors: tennis; driving; standing; walking; sitting at desk or upright position Relieving factors: recliner; OTC meds; ice  PAIN:  Are you having pain? Yes: NPRS scale: none currently; at worst 3/10 Pain location: lower biceps proximal elbow Pain description: achy Aggravating factors: lifting, typing, reaching behind her Relieving factors: uses nitro patch, stretches    PRECAUTIONS: Other: history ot L LE DVT's 2003 and 2021 - [place] negative for DVT's 12/11/22  WEIGHT BEARING RESTRICTIONS: No  FALLS:  Has patient fallen in last 6 months? 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  DIAGNOSTIC FINDINGS: 12/11/22: Venous Img Lower L leg; Directed duplex of the left lower extremity negative for DVT   ELBOW MRI IMPRESSION  10/6: 1. Partial tearing, tendinopathy, and peritendinitis of the  distal biceps tendon particularly along the long head contribution. No overt rupture. 2. Thinning or partial tearing of the proximal lateral ulnar collateral ligament. 3. Spurring of the radial head and capitellum.    MUSCLE LENGTH: Hamstrings: Right 65 deg; Left 60 deg Thomas test:not tested - tight hip flexors to palpation   POSTURE: rounded shoulders, forward head, flexed trunk , and weight shift right  PALPATION: Eval: Tenderness and tightness to palpation L > R hip flexors; posterior hip; gastroc-soleus complex   01/23/23: Rt ASIS and iliac crest elevated in standing; TTP Rt lumbar paraspinals, QL  11/26 tender to R proximal and distal biceps, common extensor tendon   LOWER EXTREMITY ROM: tight L > R hip Active ROM Right eval Left eval 01/23/23 01/30/23  Hip flexion      Hip extension      Hip abduction      Hip adduction      Hip internal rotation      Hip external rotation      Knee flexion      Knee extension      Ankle dorsiflexion 9 5 Lt: 10; Rt: 14   Ankle plantarflexion      Ankle inversion 38 35    Ankle eversion 28 21 pain anterior leg     Great toe passive extension     Lt: 55; Rt: 70    (Blank rows = not tested)  LOWER EXTREMITY MMT: MMT Right eval Left eval Right 01/01/23 Left 01/01/23 01/23/23   Hip flexion 4+ 4+ 4+ 4+ 5 bilateral   Hip extension 3+ 3+ 5 5 5  bilateral   Hip abduction 4 4- 5 4+ Lt: 4+; Rt: 4  Hip adduction   5 4   Hip internal rotation       Hip external rotation       Knee flexion 5 5     Knee extension 5 4- 5 5   Ankle dorsiflexion 4 4- 5 5   Ankle plantarflexion       Ankle inversion 5 5-     Ankle eversion 5 5-      (Blank rows = not tested) LUMBAR ROM:   Active  A/PROM  01/23/23  Flexion Full  Extension 25% limited   Right lateral flexion 25% limited pull  Left lateral flexion 25% limited pull   Right rotation 25% limited tight  Left rotation 25% limited tight    (Blank rows = not tested)  UPPER EXTREMITY MMT:   11/26  MMT Right eval Left eval 02/18/23 Right  02/27/23 Right   Shoulder flexion 4 4-    Shoulder extension 4+ 5    Shoulder abduction 4- 4-    Shoulder adduction      Shoulder extension      Shoulder internal rotation 4 5    Shoulder external rotation 4- 5    Middle trapezius      Lower trapezius      Elbow flexion 5  5 pain with neutral and supinated positioning  5 pain in extensor mass in neutral, pain in bicep in supinated   Elbow extension      Wrist flexion 5     Wrist extension 5     Wrist ulnar deviation      Wrist radial deviation      Wrist pronation      Wrist supination      Grip strength 44 44     (Blank rows = not tested)   UPPER EXTREMITY ROM:  11/26  A/P ROM Right eval Left eval  Shoulder flexion Full   Shoulder extension Full   Shoulder abduction Full   Shoulder internal rotation 85/120   Shoulder external rotation 57/63   Elbow flexion full   Elbow extension full   Wrist flexion    Wrist extension    Wrist ulnar deviation    Wrist radial deviation    Wrist pronation full   Wrist supination 60 62   (Blank rows = not tested)   SPECIAL TESTS:  11/26  painful arc with shoulder flexion in standing (negative in supine), else negative special tests  FUNCTIONAL TESTS:  5 times sit to stand: 10.41 sec use of UE's for momentum   GAIT: Distance walked: 40 feet  Assistive device utilized: None Level of assistance: Complete Independence Comments: decreased wt bearing with notable limp with stance to toe off on L   [facility] Adult PT Treatment:                                                DATE: 03/02/23 Therapeutic Exercise: UBE level 2 x 2 min each fwd/bwd  Forearm extensor stretch 2 x 30 sec  Eccentric wrist extension 2 x 10 @ 3 lbs  Forearm supination/pronation 2x10 @ 3 lbs Seated pronation to elbow ext (like a punch) 2# 2 x 10  90/90 wall ball toss red med ball 2 x 20  Resisted D2 extension green band 2 x 10  Eccentric shoulder flexion 2 x 10 @  3 lbs  Wall ball circles red med ball 1 x 20 each CW/CCW, flexion/ext, horizontally Wall clocks R shoulder facing wall with 3# wt x 10 Manual: IASTM with Edge tool to R supinator and wrist extensors  [facility] Adult PT Treatment:                                                DATE: 02/27/23 Therapeutic Exercise: UBE level 2  x 2 min each fwd/bwd  Forearm extensor stretch 2 x 30 sec  Putty gripping in neutral and supinated x 10 each  Eccentric wrist extension 2 x 10 @ 3 lbs  90/90 wall ball toss red med ball 3 x 20  Resisted D2 extension green band 2 x 10  Eccentric shoulder flexion 2 x 10 @ 3 lbs  Wall ball circles red med ball 2 x 20 each CW/CCW flexion  Updated HEP   Self Care: Progress tennis serving on court from half court, forehand swinging at half court   [facility] Adult PT Treatment:                                                DATE: 02/20/23 Therapeutic Exercise: UBE level 3 x 2 min each fwd/bwd Forearm flexor/extensor stretch 2 x 30 sec each  Resisted supination/pronation with yellow therabar 2 x 10  Supination with 2# on therabar 2 x 10  Standing shoulder flexion 4# 2 x 10  Resisted shoulder extension starting from overhead with red band (mimic overhead serve) 2 x 10  90/90 resisted shoulder IR 2 x 10; green band  Hammer curl 2 x 10 @ 4 lbs; pain with supinated grip so discontinued  Body blade elbow at 90 degrees thumb up and pronated positioning 2 x 20 sec each    [facility] Adult PT Treatment:                                                DATE: 02/18/23 Therapeutic Exercise: NuStep level 6 x 5 minutes UE/LE  Walking 1 lap in clinic with tape donned  3 way elbow flexion with silicone cupping x 10 each  Standing shoulder flexion with silicone cupping x 10  Standing shoulder abduction with silicone cupping x 10  Standing shoulder scaption with silicone cupping x 10  Supination AROM with silicone cupping x 10  Eccentric bicep curl 3# x 10 each 3 way grip  Manual Therapy: Heel cup  taping to Lt heel   Self Care: Potential benefit of gel heel cup and where to purchase Pain free UE strengthening with personal trainer, hold on tennis   [facility] Adult PT Treatment:                                                DATE: 02/10/23 Re-evaluation Elbow pain Therapeutic Exercise: R shoulder IR and ER x 15 RTB Standing 3 way raise x 15 RTB - flex/scap/ABD Standing Rows Blue TB x 15 Standing shoulder ext Blue TB x 15 Seated R biceps curl 4# x 15 3 sec up/3 sec hold/3 sec eccentric lower Seated R supination/pronation 4# x 15  PATIENT EDUCATION:  Education details: HEP review; see impression  Person educated: Patient Education method: Explanation,  Education comprehension: verbalized understanding,   HOME EXERCISE PROGRAM: Access Code: [email or web address] URL: [email or web address] Date: 01/30/2023 Prepared by: Letitia Libra  Exercises - Supine Piriformis Stretch with Leg Straight  - 2 x daily - 7 x weekly - 1 sets - 3 reps - 30 sec  hold - Hooklying Hamstring Stretch  with Strap  - 2 x daily - 7 x weekly - 1 sets - 3 reps - 30 sec  hold - Supine ITB Stretch with Strap  - 2 x daily - 7 x weekly - 1 sets - 3 reps - 30 sec  hold - Gastroc Stretch on Wall  - 2 x daily - 7 x weekly - 1 sets - 3 reps - 30 sec  hold - Soleus Stretch on Wall  - 2 x daily - 7 x weekly - 1 sets - 3 reps - 30 sec  hold - Supine Quadriceps Stretch with Strap on Table  - 2 x daily - 7 x weekly - 1 sets - 3 reps - 30-60 sec hold - Standing Eccentric Heel Raise  - 1 x daily - 7 x weekly - 3 sets - 10 reps - 3 sec hold - Single Leg Stance  - 1 x daily - 7 x weekly - 2 sets - 3-5 reps - 20 sec hold - The Diver  - 1 x daily - 7 x weekly - 1 sets - 10 reps - 2-3 sec  hold - Towel Scrunches  - 1 x daily - 7 x weekly - 1 sets - 3 reps - 30 sec  hold - Toe Yoga - Alternating Great Toe and Lesser Toe Extension  - 1 x daily - 7 x weekly - 1 sets - 10 reps - 3 sec  hold - Arch Lifting  - 1 x daily - 7 x weekly -  1 sets - 10-15 reps - 2-3 sec  hold - sidelying low back stretch  - 1 x daily - 7 x weekly - 1 sets - 3 reps - 30 seconds hold - Seated Self Great Toe Stretch  - 1 x daily - 7 x weekly - 3 sets - 30 sec  hold  Access Code: [email or web address] BACK AND ELBOW  URL: [email or web address] Date: 02/27/2023 Prepared by: Letitia Libra  Exercises - Supine Lower Trunk Rotation  - 1 x daily - 7 x weekly - 2 sets - 10 reps - Cat Cow  - 1 x daily - 7 x weekly - 2 sets - 10 reps - Sidelying Thoracic Rotation with Open Book  - 1 x daily - 7 x weekly - 2 sets - 10 reps - Standing Quadratus Lumborum Stretch with Doorway  - 1 x daily - 7 x weekly - 3 sets - 30 sec hold - Supine Pelvic Tilt  - 1 x daily - 7 x weekly - 2 sets - 10 reps - Seated 3 Way Exercise Ball Roll Out Stretch  - 1 x daily - 7 x weekly - 2 sets - 10 reps - Shoulder External Rotation with Anchored Resistance  - 1 x daily - 7 x weekly - 1-3 sets - 10 reps - Shoulder Internal Rotation with Resistance  - 1 x daily - 7 x weekly - 1-3 sets - 10 reps - Standing Single Arm Shoulder Flexion with Resistance  - 1 x daily - 3-4 x weekly - 3 sets - 10 reps - Standing Single Arm Shoulder Abduction with Resistance  - 1 x daily - 3-4 x weekly - 3 sets - 10 reps - Standing Single Arm Shoulder Abduction with Resistance  - 1 x daily - 3-4 x weekly - 3 sets - 10 reps - Standing Shoulder Row with Anchored Resistance  - 1 x daily - 3-4 x weekly - 3 sets - 10 reps - Shoulder  extension with resistance - Neutral  - 1 x daily - 3-4 x weekly - 3 sets - 10 reps - Seated Single Arm Elbow Flexion with Dumbbell  - 1 x daily - 3-4 x weekly - 3 sets - 10 reps - 3 sec  hold - Putty Squeezes  - 1 x daily - 7 x weekly - 2 sets - 10 reps - Seated Eccentric Wrist Extension  - 1 x daily - 3 x weekly - 2 sets - 10 reps   ASSESSMENT:  CLINICAL IMPRESSION: Ricketta reports her elbow is feeling better. She tried gentle tennis (hitting balls) including backhand and reports  just soreness. She tolerated all exercises today without increased pain. She did have tightness in the her R supinator and wrist extensors which responded very well to IASTM.    Re-evaluation completed for elbow: Tanny presents with reports of R elbow pain since March of this year. Pain has improved, but she still is limited with lifting, reaching backwards and typing. She demonstrates full elbow ROM and strength, but does have significant shoulder weakness B. She has tenderness in the LH biceps tendon and in the biceps muscle belly as well as the common extensor tendons. She will benefit from skilled PT to address these deficits.  Good tolerance to initial TE today except reports of some shoulder pain with end range resisted ABDuction.   Re-evaluation completed as patient with updated referral for acute right-sided low back pain without sciatica. She reports initial onset of pain while playing tennis a couple months ago, but muscle spasm began in her back about a week ago. Overall her pain has improved, but has not returned to her previous level of activity prior to this acute flare up. She is noted to have limited lateral trunk flexion and rotation AROM with noted tightness/pulling with these movements. She has palpable tenderness about Rt lumbar musculature and postural abnormalities. HEP was issued to include trunk mobility She will benefit from continued skilled PT to address the above stated deficits in order to optimize her function and assist in overall pain reduction.   Eval: Patient is a 52 y.o. female who was seen today for physical therapy evaluation and treatment for L leg pain. She reports initial pain in the L knee and into L calf area at least 7 months ago. She had PT for the knee and dry needling for the calf with some improvement but then experienced pain in the L calf again beginning in June with symptoms increasing in the past month. Patient has no know injury. She does have a history of  DVT's in 2003 and 2021. [place] 9/24 negative for DVTs. Patient presents with abnormal gait pattern; muscular tightness to palpation; decreased ROM; weakness in L > R hips; knees and ankles. Patient will benefit from comprehensive PT program to address LE symptoms.   OBJECTIVE IMPAIRMENTS: Abnormal gait, decreased activity tolerance, decreased ROM, decreased strength, hypomobility, impaired flexibility, improper body mechanics, postural dysfunction, obesity, and pain.    GOALS: Goals reviewed with patient? Yes  SHORT TERM GOALS: Target date: 01/29/2023  Independent in initial HEP  Baseline: Goal status: MET  2.  Increase AROM L bilat ankle DF by 3-5 degrees  Baseline:  Goal status: MET  LONG TERM GOALS: Target date: 03/21/23  Decrease pain L calf by 75-100% allowing patient to return to all normal functional activities  Baseline:  Goal status: INITIAL  2.  5/5 strength bilat LE's  Baseline:  Goal status: INITIAL  3.  Ambulation for 20-30 min with pain no greater than 1-2/10 and no limp  Baseline:  Goal status: INITIAL  4.  Patient returns to full functional and recreational activities including return to tennis  Baseline:  Goal status: INITIAL  5.  Independent in HEP, including aquatic therapy as indicated  Baseline:  Goal status: INITIAL  6.  Improve functional limitation 60  Baseline: 48 Goal status: INITIAL  6. Patient will demonstrate full and pain free trunk AROM to improve ability to complete reaching and bending activity.  Baseline: see above Goal status: NEW  7.  Increased R shoulder strength to 5/5 Baseline:  Goal status: NEW  8. Decreased R elbow and biceps pain by 85% with ADLs to improve QOL. Baseline:  Goal status: NEW   PLAN:  PT FREQUENCY: 2x/week  PT DURATION: 8 weeks  PLANNED INTERVENTIONS: 97164- PT Re-evaluation, 97110-Therapeutic exercises, 97530- Therapeutic activity, [id number]- Neuromuscular re-education, 97535- Self Care, 96045- Manual  therapy, [id number]- Gait training, [id number]- Aquatic Therapy, 97014- Electrical stimulation (unattended), [id number]- Electrical stimulation (manual), [id number]- Vasopneumatic device, [id number]- Traction (mechanical), [id number]- Ionotophoresis 4mg /ml Dexamethasone, Taping, Dry Needling, Cryotherapy, and Moist heat.  PLAN FOR NEXT SESSION: Elbow: continue MT to supinator and wrist extensors/possible DN, R shoulder strength, biceps eccentric focus, grip/putty. Single leg dynamic activities and lateral movement in preparation for return to tennis; DN and STM/IASTM for calf L/R as needed; Progress eccentric and intrinsic foot strengthening. Trunk mobility; core strengthening  Solon Palm, PT 03/02/23 9:34 AM

## 2023-03-04 ENCOUNTER — Ambulatory Visit: Payer: BC Managed Care – PPO

## 2023-03-04 ENCOUNTER — Other Ambulatory Visit: Payer: Self-pay

## 2023-03-04 DIAGNOSIS — R2689 Other abnormalities of gait and mobility: Secondary | ICD-10-CM | POA: Diagnosis not present

## 2023-03-04 DIAGNOSIS — M79662 Pain in left lower leg: Secondary | ICD-10-CM | POA: Diagnosis not present

## 2023-03-04 DIAGNOSIS — M25521 Pain in right elbow: Secondary | ICD-10-CM

## 2023-03-04 DIAGNOSIS — M5459 Other low back pain: Secondary | ICD-10-CM

## 2023-03-04 DIAGNOSIS — R29898 Other symptoms and signs involving the musculoskeletal system: Secondary | ICD-10-CM | POA: Diagnosis not present

## 2023-03-04 DIAGNOSIS — M6281 Muscle weakness (generalized): Secondary | ICD-10-CM | POA: Diagnosis not present

## 2023-03-04 MED ORDER — HYDROCHLOROTHIAZIDE 12.5 MG PO TABS: 12.5000 mg | ORAL_TABLET | Freq: Every day | ORAL | 0 refills | Status: DC

## 2023-03-04 NOTE — Therapy (Addendum)
 OUTPATIENT PHYSICAL THERAPY TREATMENT  PHYSICAL THERAPY DISCHARGE SUMMARY  Visits from Start of Care: 20  Current functional level related to goals / functional outcomes: See goals below   Remaining deficits: Status unknown   Education / Equipment: N/A   Patient agrees to discharge. Patient goals were partially met. Patient is being discharged due to not returning since the last visit.   Patient Name: Kristen Meyers MRN: [id number] DOB:12-Jun-1970, 52 y.o., female Today's Date: 03/04/2023  END OF SESSION:  PT End of Session - 03/04/23 1406     Visit Number 20    Number of Visits 26    Date for PT Re-Evaluation 03/21/23    Authorization Type [employer] copay 40    Authorization Time Period year - 57 remaing for year per TL    Authorization - Number of Visits 60    PT Start Time 1405    PT Stop Time 1444    PT Time Calculation (min) 39 min    Activity Tolerance Patient tolerated treatment well    Behavior During Therapy WFL for tasks assessed/performed                     Past Medical History:  Diagnosis Date   Achilles tendonosis of left lower extremity    Acute DVT (deep venous thrombosis) (HCC)    DVT 2012-january, L leg, in the context of post-C section. Again 2021, L leg, dx'd shortly after having covid infection->took eliquis x 3mo.   Cholelithiasis without obstruction 09/19/09 (u/s)   Chronic elbow pain, right    Dr. Karie Schwalbe, MRI 2024->tendonopathy/partial tear of distal biceps tendon, thinning or partial tear of the lateral ulnar collateral ligament, spurring of the radial head and capitellum.   COVID-19 virus infection    approx 11/09/19---got antibody infusion   Diabetes mellitus without complication (HCC) 03/21/2010   Elevated transaminase level 07/19/2012   Essential hypertension 03/21/2010   Qualifier: Diagnosis of   By: Gabriel Rung LPN, Enid Baas liver 09/19/09 (u/s)   Mildly elevated transaminases   GERD 03/21/2010   History of adenomatous polyp  of colon 12/2018   2020.  Normal 01/14/22. 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  01/14/22 no polyps. Recall 5 yrs.   Coronary calcium score  10/2022   Score 26, 90th%'tile   LAPAROSCOPIC HYSTERECTOMY  02/2014   Ovaries remain (tubes out): done for DUB   TRANSTHORACIC ECHOCARDIOGRAM     11/2022 EF 55-60%, grd I DD, AV sclerosis w/out stenosis.   TUBAL LIGATION  2011   Dr. Malachy Mood is her GYN.   WISDOM TOOTH EXTRACTION     Patient Active Problem List   Diagnosis Date Noted   Chronic elbow pain,  right 12/11/2022   Left leg pain 12/11/2022   Chest pain of uncertain etiology 10/24/2022   Cardiac murmur 10/24/2022   Acute DVT (deep venous thrombosis) (HCC)    Cholelithiasis without obstruction    COVID-19 virus infection    Fatty liver    Obesity, Class II, BMI 35-39.9    Seizures (HCC)    History of laparoscopic-assisted vaginal hysterectomy 02/14/2020   Deep venous thrombosis (HCC) 02/14/2020   Subclinical hypothyroidism 2021   Melanocytic nevi, unspecified 12/30/2018   History of adenomatous polyp of colon 12/2018   Diabetes mellitus without complication (HCC) 06/22/2015   Health maintenance examination 07/20/2013   NAFLD (nonalcoholic fatty liver disease) 52/84/1324   Obesity, morbid (HCC) 10/19/2012   Paresthesia of left arm 09/30/2012   Low back pain  09/30/2012   Left shoulder pain 09/30/2012   Type II or unspecified type diabetes mellitus without mention of complication, uncontrolled 07/19/2012   Elevated transaminase level 07/19/2012   History of herpes zoster 03/2012   Hyperlipidemia 03/21/2010   Essential hypertension 03/21/2010   GERD 03/21/2010    PCP: Dr Earley Favor  REFERRING PROVIDER:  Dr Thornell Mule, Maryjean Morn, MD   REFERRING DIAG: L leg pain (Dr. Benjamin Stain)   M54.50 (ICD-10-CM) - Acute right-sided low back pain without sciatica (Dr. Milinda Cave)   M25.521,G89.29 (ICD-10-CM) - Chronic elbow pain, right   THERAPY DIAG:  Pain in right elbow  Muscle weakness (generalized)  Other symptoms and signs involving the musculoskeletal system  Other abnormalities of gait and mobility  Other low back pain  Pain of left calf  Rationale for Evaluation and Treatment: Rehabilitation  ONSET DATE: 08/16/22  SUBJECTIVE:   SUBJECTIVE STATEMENT: Did a little incline on the treadmill yesterday and that was ok. No pain right now.     ELBOW EVAL: She has been having right elbow pain since March. It's gotten a little better. MRI shows biceps tear. It's gotten better. If pick up something top heavy or too far behind me it hurts and it is weaker. Can't do normal work out. And it was starting to hurt with some tennis swings. She can lift hair dryer now. Typing too long hurts.  BACK EVAL: Patient with new referral for her back. Patient had a knot in the back for a couple months and aggravated it with weight training and then it began to spasm while in PT about a week ago. She does recall swinging her tennis racquet and felt a twinge in the back as her initial cause of pain. She kept playing when this occurred in September. She has noticed the knot before and spasms previously, but not to the extent of her recent pain. Patient reports her back is feeling better since last week. She did not have to take any muscle  relaxers yesterday and did a mild workout without issues. Continues to take anti-inflammatories. No red flag symptoms. She has still not returned to tennis.   Eval: Patient reports that she has had L leg pain for several months with no known injury. Symptoms have increased in the past month. She has pain and inflammation in the L leg on constant basis now. Pain was intermittent prior to the past month. Had pain in the L knee 3/24 "floating knee cap", then L calf pain. Went to PT with some improvement in the knee with exercise and DN but she stated having pain again 6/24  PERTINENT HISTORY: AODM; HTN; elevated cholesterol; knee pain bilat; R elbow pain for ~  past 6-7 months did PT for tennis elbow with some improvement then symptoms started getting worse; history ot L LE DVT's 2003 and 2021  PAIN:  Are you having pain? Yes: NPRS scale: none currently; at worst 2/10 Pain location: Lt achilles  Pain description: tightness Aggravating factors: tennis; driving; standing; walking; sitting at desk or upright position Relieving factors: recliner; OTC meds; ice  PAIN:  Are you having pain? Yes: NPRS scale: none currently; at worst 3/10 Pain location: lower biceps proximal elbow Pain description: achy Aggravating factors: lifting, typing, reaching behind her Relieving factors: uses nitro patch, stretches    PRECAUTIONS: Other: history ot L LE DVT's 2003 and 2021 - [place] negative for DVT's 12/11/22  WEIGHT BEARING RESTRICTIONS: No  FALLS:  Has patient fallen in last 6 months? 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  DIAGNOSTIC FINDINGS: 12/11/22: Venous Img Lower L leg; Directed  duplex of the left lower extremity negative for DVT   ELBOW MRI IMPRESSION  10/6: 1. Partial tearing, tendinopathy, and peritendinitis of the distal biceps tendon particularly along the long head contribution. No overt rupture. 2. Thinning or partial tearing of the proximal lateral ulnar collateral ligament. 3. Spurring of the radial head and capitellum.    MUSCLE LENGTH: Hamstrings: Right 65 deg; Left 60 deg Thomas test:not tested - tight hip flexors to palpation   POSTURE: rounded shoulders, forward head, flexed trunk , and weight shift right  PALPATION: Eval: Tenderness and tightness to palpation L > R hip flexors; posterior hip; gastroc-soleus complex   01/23/23: Rt ASIS and iliac crest elevated in standing; TTP Rt lumbar paraspinals, QL  11/26 tender to R proximal and distal biceps, common extensor tendon   LOWER EXTREMITY ROM: tight L > R hip Active ROM Right eval Left eval 01/23/23 01/30/23  Hip flexion      Hip extension      Hip abduction      Hip adduction      Hip internal rotation      Hip external rotation      Knee flexion      Knee extension      Ankle dorsiflexion 9 5 Lt: 10; Rt: 14   Ankle plantarflexion      Ankle inversion 38 35    Ankle eversion 28 21 pain anterior leg     Great toe passive extension     Lt: 55; Rt: 70    (Blank rows = not tested)  LOWER EXTREMITY MMT: MMT Right eval Left eval Right 01/01/23 Left 01/01/23 01/23/23   Hip flexion 4+ 4+ 4+ 4+ 5 bilateral   Hip extension 3+ 3+ 5 5 5  bilateral   Hip abduction 4 4- 5 4+ Lt: 4+; Rt: 4  Hip adduction   5 4   Hip internal rotation       Hip external rotation       Knee flexion 5 5     Knee extension 5 4- 5 5   Ankle dorsiflexion 4 4- 5 5   Ankle plantarflexion       Ankle inversion 5 5-     Ankle eversion 5 5-      (Blank  rows = not tested) LUMBAR ROM:   Active  A/PROM  01/23/23  Flexion Full   Extension 25% limited   Right lateral flexion 25% limited pull  Left lateral  flexion 25% limited pull   Right rotation 25% limited tight  Left rotation 25% limited tight    (Blank rows = not tested)  UPPER EXTREMITY MMT:  11/26  MMT Right eval Left eval 02/18/23 Right  02/27/23 Right   Shoulder flexion 4 4-    Shoulder extension 4+ 5    Shoulder abduction 4- 4-    Shoulder adduction      Shoulder extension      Shoulder internal rotation 4 5    Shoulder external rotation 4- 5    Middle trapezius      Lower trapezius      Elbow flexion 5  5 pain with neutral and supinated positioning  5 pain in extensor mass in neutral, pain in bicep in supinated   Elbow extension      Wrist flexion 5     Wrist extension 5     Wrist ulnar deviation      Wrist radial deviation      Wrist pronation      Wrist supination      Grip strength 44 44     (Blank rows = not tested)   UPPER EXTREMITY ROM:  11/26  A/P ROM Right eval Left eval  Shoulder flexion Full   Shoulder extension Full   Shoulder abduction Full   Shoulder internal rotation 85/120   Shoulder external rotation 57/63   Elbow flexion full   Elbow extension full   Wrist flexion    Wrist extension    Wrist ulnar deviation    Wrist radial deviation    Wrist pronation full   Wrist supination 60 62   (Blank rows = not tested)   SPECIAL TESTS:  11/26  painful arc with shoulder flexion in standing (negative in supine), else negative special tests  FUNCTIONAL TESTS:  5 times sit to stand: 10.41 sec use of UE's for momentum   GAIT: Distance walked: 40 feet  Assistive device utilized: None Level of assistance: Complete Independence Comments: decreased wt bearing with notable limp with stance to toe off on L   [facility] Adult PT Treatment:                                                DATE: 03/04/23 Therapeutic Exercise: UBE level 3 x 2 min each fwd/bwd  Upright row 2 x 10 @ 5 lbs  Bent over row 2 x 10 @ 10 lbs  Sustained shoulder flexion with supination/pronation of dumbbell 2 x 10; 5#  Wall pushup 2  x 10  Lat pulldown 2 x 10 @ 25 lbs  Resisted shoulder IR 5 lbs 2 x 10  Rebounder ball toss blue med ball x 10, x 10 yellow med ball  Bicep curl 1 x 10 @ 2 lbs  Tricep kickback 2 x 10 @ 5 lbs   Self Care: Tennis progression: 50% max force of hitting and serving full court   [facility] Adult PT Treatment:  DATE: 03/02/23 Therapeutic Exercise: UBE level 2 x 2 min each fwd/bwd  Forearm extensor stretch 2 x 30 sec  Eccentric wrist extension 2 x 10 @ 3 lbs  Forearm supination/pronation 2x10 @ 3 lbs Seated pronation to elbow ext (like a punch) 2# 2 x 10  90/90 wall ball toss red med ball 2 x 20  Resisted D2 extension green band 2 x 10  Eccentric shoulder flexion 2 x 10 @ 3 lbs  Wall ball circles red med ball 1 x 20 each CW/CCW, flexion/ext, horizontally Wall clocks R shoulder facing wall with 3# wt x 10 Manual: IASTM with Edge tool to R supinator and wrist extensors  [facility] Adult PT Treatment:                                                DATE: 02/27/23 Therapeutic Exercise: UBE level 2 x 2 min each fwd/bwd  Forearm extensor stretch 2 x 30 sec  Putty gripping in neutral and supinated x 10 each  Eccentric wrist extension 2 x 10 @ 3 lbs  90/90 wall ball toss red med ball 3 x 20  Resisted D2 extension green band 2 x 10  Eccentric shoulder flexion 2 x 10 @ 3 lbs  Wall ball circles red med ball 2 x 20 each CW/CCW flexion  Updated HEP   Self Care: Progress tennis serving on court from half court, forehand swinging at half court   [facility] Adult PT Treatment:                                                DATE: 02/20/23 Therapeutic Exercise: UBE level 3 x 2 min each fwd/bwd Forearm flexor/extensor stretch 2 x 30 sec each  Resisted supination/pronation with yellow therabar 2 x 10  Supination with 2# on therabar 2 x 10  Standing shoulder flexion 4# 2 x 10  Resisted shoulder extension starting from overhead with red band (mimic overhead serve) 2 x 10   90/90 resisted shoulder IR 2 x 10; green band  Hammer curl 2 x 10 @ 4 lbs; pain with supinated grip so discontinued  Body blade elbow at 90 degrees thumb up and pronated positioning 2 x 20 sec each   PATIENT EDUCATION:  Education details: HEP review; see impression  Person educated: Patient Education method: Explanation,  Education comprehension: verbalized understanding,   HOME EXERCISE PROGRAM: Access Code: [email or web address] URL: [email or web address] Date: 01/30/2023 Prepared by: Letitia Libra  Exercises - Supine Piriformis Stretch with Leg Straight  - 2 x daily - 7 x weekly - 1 sets - 3 reps - 30 sec  hold - Hooklying Hamstring Stretch with Strap  - 2 x daily - 7 x weekly - 1 sets - 3 reps - 30 sec  hold - Supine ITB Stretch with Strap  - 2 x daily - 7 x weekly - 1 sets - 3 reps - 30 sec  hold - Gastroc Stretch on Wall  - 2 x daily - 7 x weekly - 1 sets - 3 reps - 30 sec  hold - Soleus Stretch on Wall  - 2 x daily - 7 x weekly - 1 sets - 3 reps - 30 sec  hold - Supine  Quadriceps Stretch with Strap on Table  - 2 x daily - 7 x weekly - 1 sets - 3 reps - 30-60 sec hold - Standing Eccentric Heel Raise  - 1 x daily - 7 x weekly - 3 sets - 10 reps - 3 sec hold - Single Leg Stance  - 1 x daily - 7 x weekly - 2 sets - 3-5 reps - 20 sec hold - The Diver  - 1 x daily - 7 x weekly - 1 sets - 10 reps - 2-3 sec  hold - Towel Scrunches  - 1 x daily - 7 x weekly - 1 sets - 3 reps - 30 sec  hold - Toe Yoga - Alternating Great Toe and Lesser Toe Extension  - 1 x daily - 7 x weekly - 1 sets - 10 reps - 3 sec  hold - Arch Lifting  - 1 x daily - 7 x weekly - 1 sets - 10-15 reps - 2-3 sec  hold - sidelying low back stretch  - 1 x daily - 7 x weekly - 1 sets - 3 reps - 30 seconds hold - Seated Self Great Toe Stretch  - 1 x daily - 7 x weekly - 3 sets - 30 sec  hold  Access Code: [email or web address] BACK AND ELBOW  URL: [email or web address] Date: 02/27/2023 Prepared by: Letitia Libra  Exercises - Supine Lower Trunk Rotation  - 1 x daily - 7 x weekly - 2 sets - 10 reps - Cat Cow  - 1 x daily - 7 x weekly - 2 sets - 10 reps - Sidelying Thoracic Rotation with Open Book  - 1 x daily - 7 x weekly - 2 sets - 10 reps - Standing Quadratus Lumborum Stretch with Doorway  - 1 x daily - 7 x weekly - 3 sets - 30 sec hold - Supine Pelvic Tilt  - 1 x daily - 7 x weekly - 2 sets - 10 reps - Seated 3 Way Exercise Ball Roll Out Stretch  - 1 x daily - 7 x weekly - 2 sets - 10 reps - Shoulder External Rotation with Anchored Resistance  - 1 x daily - 7 x weekly - 1-3 sets - 10 reps - Shoulder Internal Rotation with Resistance  - 1 x daily - 7 x weekly - 1-3 sets - 10 reps - Standing Single Arm Shoulder Flexion with Resistance  - 1 x daily - 3-4 x weekly - 3 sets - 10 reps - Standing Single Arm Shoulder Abduction with Resistance  - 1 x daily - 3-4 x weekly - 3 sets - 10 reps - Standing Single Arm Shoulder Abduction with Resistance  - 1 x daily - 3-4 x weekly - 3 sets - 10 reps - Standing Shoulder Row with Anchored Resistance  - 1 x daily - 3-4 x weekly - 3 sets - 10 reps - Shoulder extension with resistance - Neutral  - 1 x daily - 3-4 x weekly - 3 sets - 10 reps - Seated Single Arm Elbow Flexion with Dumbbell  - 1 x daily - 3-4 x weekly - 3 sets - 10 reps - 3 sec  hold - Putty Squeezes  - 1 x daily - 7 x weekly - 2 sets - 10 reps - Seated Eccentric Wrist Extension  - 1 x daily - 3 x weekly - 2 sets - 10 reps   ASSESSMENT:  CLINICAL IMPRESSION: Patient arrives without reports of pain. Progressed shoulder  and elbow strengthening with fairly good tolerance. With upright row she initially reported pain about bicep and extensor mass, but when wrist adjusted to neutral pain is only present at very end range of concentric phase of motion. Able to modestly load the bicep with curls today with patient reporting mild ache towards in of prescribed reps at distal bicep brachii. Discussed tennis  progression to include full court serving and hitting at 50% max force prior to next visit.    Re-evaluation completed for elbow: Francheska presents with reports of R elbow pain since March of this year. Pain has improved, but she still is limited with lifting, reaching backwards and typing. She demonstrates full elbow ROM and strength, but does have significant shoulder weakness B. She has tenderness in the LH biceps tendon and in the biceps muscle belly as well as the common extensor tendons. She will benefit from skilled PT to address these deficits.  Good tolerance to initial TE today except reports of some shoulder pain with end range resisted ABDuction.   Re-evaluation completed as patient with updated referral for acute right-sided low back pain without sciatica. She reports initial onset of pain while playing tennis a couple months ago, but muscle spasm began in her back about a week ago. Overall her pain has improved, but has not returned to her previous level of activity prior to this acute flare up. She is noted to have limited lateral trunk flexion and rotation AROM with noted tightness/pulling with these movements. She has palpable tenderness about Rt lumbar musculature and postural abnormalities. HEP was issued to include trunk mobility She will benefit from continued skilled PT to address the above stated deficits in order to optimize her function and assist in overall pain reduction.   Eval: Patient is a 52 y.o. female who was seen today for physical therapy evaluation and treatment for L leg pain. She reports initial pain in the L knee and into L calf area at least 7 months ago. She had PT for the knee and dry needling for the calf with some improvement but then experienced pain in the L calf again beginning in June with symptoms increasing in the past month. Patient has no know injury. She does have a history of DVT's in 2003 and 2021. [place] 9/24 negative for DVTs. Patient presents with abnormal  gait pattern; muscular tightness to palpation; decreased ROM; weakness in L > R hips; knees and ankles. Patient will benefit from comprehensive PT program to address LE symptoms.   OBJECTIVE IMPAIRMENTS: Abnormal gait, decreased activity tolerance, decreased ROM, decreased strength, hypomobility, impaired flexibility, improper body mechanics, postural dysfunction, obesity, and pain.    GOALS: Goals reviewed with patient? Yes  SHORT TERM GOALS: Target date: 01/29/2023  Independent in initial HEP  Baseline: Goal status: MET  2.  Increase AROM L bilat ankle DF by 3-5 degrees  Baseline:  Goal status: MET  LONG TERM GOALS: Target date: 03/21/23  Decrease pain L calf by 75-100% allowing patient to return to all normal functional activities  Baseline:  Goal status: INITIAL  2.  5/5 strength bilat LE's  Baseline:  Goal status: INITIAL  3.  Ambulation for 20-30 min with pain no greater than 1-2/10 and no limp  Baseline:  Goal status: INITIAL  4.  Patient returns to full functional and recreational activities including return to tennis  Baseline:  Goal status: INITIAL  5.  Independent in HEP, including aquatic therapy as indicated  Baseline:  Goal status: INITIAL  6.  Improve functional limitation 60  Baseline: 48 Goal status: INITIAL  6. Patient will demonstrate full and pain free trunk AROM to improve ability to complete reaching and bending activity.  Baseline: see above Goal status: NEW  7.  Increased R shoulder strength to 5/5 Baseline:  Goal status: NEW  8. Decreased R elbow and biceps pain by 85% with ADLs to improve QOL. Baseline:  Goal status: NEW   PLAN:  PT FREQUENCY: 2x/week  PT DURATION: 8 weeks  PLANNED INTERVENTIONS: 97164- PT Re-evaluation, 97110-Therapeutic exercises, 97530- Therapeutic activity, [id number]- Neuromuscular re-education, 97535- Self Care, 91478- Manual therapy, [id number]- Gait training, [id number]- Aquatic Therapy, 97014- Electrical stimulation  (unattended), [id number]- Electrical stimulation (manual), [id number]- Vasopneumatic device, [id number]- Traction (mechanical), [id number]- Ionotophoresis 4mg /ml Dexamethasone, Taping, Dry Needling, Cryotherapy, and Moist heat.  PLAN FOR NEXT SESSION: n/a d/c   Letitia Libra, PT, DPT, ATC 03/04/23 2:44 PM  Letitia Libra, PT, DPT, ATC 06/09/23 1:37 PM

## 2023-03-05 ENCOUNTER — Other Ambulatory Visit: Payer: Self-pay

## 2023-03-05 ENCOUNTER — Emergency Department (HOSPITAL_BASED_OUTPATIENT_CLINIC_OR_DEPARTMENT_OTHER): Payer: BC Managed Care – PPO

## 2023-03-05 ENCOUNTER — Emergency Department (HOSPITAL_BASED_OUTPATIENT_CLINIC_OR_DEPARTMENT_OTHER)
Admission: EM | Admit: 2023-03-05 | Discharge: 2023-03-05 | Disposition: A | Discharge status: Home / Self Care | Payer: BC Managed Care – PPO | Attending: Emergency Medicine | Admitting: Emergency Medicine

## 2023-03-05 ENCOUNTER — Encounter (HOSPITAL_BASED_OUTPATIENT_CLINIC_OR_DEPARTMENT_OTHER): Payer: Self-pay | Admitting: *Deleted

## 2023-03-05 DIAGNOSIS — R001 Bradycardia, unspecified: Secondary | ICD-10-CM | POA: Insufficient documentation

## 2023-03-05 DIAGNOSIS — Z79899 Other long term (current) drug therapy: Secondary | ICD-10-CM | POA: Insufficient documentation

## 2023-03-05 DIAGNOSIS — I10 Essential (primary) hypertension: Secondary | ICD-10-CM | POA: Diagnosis not present

## 2023-03-05 DIAGNOSIS — R079 Chest pain, unspecified: Secondary | ICD-10-CM | POA: Diagnosis not present

## 2023-03-05 DIAGNOSIS — Z7984 Long term (current) use of oral hypoglycemic drugs: Secondary | ICD-10-CM | POA: Diagnosis not present

## 2023-03-05 DIAGNOSIS — R0789 Other chest pain: Secondary | ICD-10-CM | POA: Diagnosis not present

## 2023-03-05 DIAGNOSIS — E119 Type 2 diabetes mellitus without complications: Secondary | ICD-10-CM | POA: Insufficient documentation

## 2023-03-05 DIAGNOSIS — K760 Fatty (change of) liver, not elsewhere classified: Secondary | ICD-10-CM | POA: Diagnosis not present

## 2023-03-05 DIAGNOSIS — R1013 Epigastric pain: Secondary | ICD-10-CM | POA: Diagnosis not present

## 2023-03-05 DIAGNOSIS — K828 Other specified diseases of gallbladder: Secondary | ICD-10-CM | POA: Diagnosis not present

## 2023-03-05 DIAGNOSIS — K8020 Calculus of gallbladder without cholecystitis without obstruction: Secondary | ICD-10-CM | POA: Diagnosis not present

## 2023-03-05 DIAGNOSIS — R42 Dizziness and giddiness: Secondary | ICD-10-CM | POA: Insufficient documentation

## 2023-03-05 DIAGNOSIS — R1011 Right upper quadrant pain: Secondary | ICD-10-CM | POA: Diagnosis not present

## 2023-03-05 LAB — BASIC METABOLIC PANEL
Anion gap: 10 (ref 5–15)
BUN: 26 mg/dL — ABNORMAL HIGH (ref 6–20)
CO2: 24 mmol/L (ref 22–32)
Calcium: 9.1 mg/dL (ref 8.9–10.3)
Chloride: 101 mmol/L (ref 98–111)
Creatinine, Ser: 0.73 mg/dL (ref 0.44–1.00)
GFR, Estimated: >60 mL/min (ref >60)
Glucose, Bld: 188 mg/dL — ABNORMAL HIGH (ref 70–99)
Potassium: 4.2 mmol/L (ref 3.5–5.1)
Sodium: 135 mmol/L (ref 135–145)

## 2023-03-05 LAB — HEPATIC FUNCTION PANEL
ALT: 35 U/L (ref 0–44)
AST: 26 U/L (ref 15–41)
Albumin: 4.3 g/dL (ref 3.5–5.0)
Alkaline Phosphatase: 60 U/L (ref 38–126)
Bilirubin, Direct: 0.1 mg/dL (ref 0.0–0.2)
Indirect Bilirubin: 0.6 mg/dL (ref 0.3–0.9)
Total Bilirubin: 0.7 mg/dL
Total Protein: 7.3 g/dL (ref 6.5–8.1)

## 2023-03-05 LAB — CBC
HCT: 41.7 % (ref 36.0–46.0)
Hemoglobin: 13.8 g/dL (ref 12.0–15.0)
MCH: 28.5 pg (ref 26.0–34.0)
MCHC: 33.1 g/dL (ref 30.0–36.0)
MCV: 86 fL (ref 80.0–100.0)
Platelets: 256 10*3/uL (ref 150–400)
RBC: 4.85 MIL/uL (ref 3.87–5.11)
RDW: 13.2 % (ref 11.5–15.5)
WBC: 7.2 10*3/uL (ref 4.0–10.5)
nRBC: 0 % (ref 0.0–0.2)

## 2023-03-05 LAB — TROPONIN I (HIGH SENSITIVITY)
Troponin I (High Sensitivity): 2 ng/L (ref <18)
Troponin I (High Sensitivity): <2 ng/L (ref <18)

## 2023-03-05 LAB — TSH: TSH: 2.645 u[IU]/mL (ref 0.350–4.500)

## 2023-03-05 LAB — LIPASE, BLOOD: Lipase: 40 U/L (ref 11–51)

## 2023-03-05 LAB — T4, FREE: Free T4: 0.8 ng/dL (ref 0.61–1.12)

## 2023-03-05 LAB — MAGNESIUM: Magnesium: 2 mg/dL (ref 1.7–2.4)

## 2023-03-05 NOTE — ED Notes (Signed)
Pt has returned to their room 

## 2023-03-05 NOTE — ED Provider Notes (Signed)
Kristen Meyers Provider Note   CSN: [id number] Arrival date & time: 03/05/23  1258     History  Chief Complaint  Patient presents with   Abdominal Pain   Chest Pain    Kristen Meyers is a 52 y.o. female.  52 year old female with history of cholelithiasis, diabetes, hypertension, and hyperlipidemia who presents to the emergency department with epigastric abdominal pain and lightheadedness.  At 9 AM had an episode of epigastric abdominal pain that she says radiated to her back and upper chest.  Says that it lasted for approximately an hour or so and then resolved.  Pain was nonexertional.  No diaphoresis or vomiting.  During this time she thinks that her heart rate was bradycardic into the 40s based on a fitness watch that she was wearing.  Felt lightheaded but did not pass out.  Resolved and then recurred shortly afterwards and decided to come into the emergency department for evaluation.  No history of PCI.  Had a stress echo on 11/2022 that was unremarkable.  No abdominal surgeries.       Home Medications Prior to Admission medications   Medication Sig Start Date End Date Taking? Authorizing Provider  celecoxib (CELEBREX) 100 MG capsule 1 capsule p.o. 2-3 times daily 12/11/22   Monica Becton, MD  dapagliflozin propanediol (FARXIGA) 10 MG TABS tablet TAKE 1 TABLET DAILY 05/19/22   McGowen, Maryjean Morn, MD  enalapril (VASOTEC) 20 MG tablet TAKE 2 TABLETS DAILY 10/30/22   McGowen, Maryjean Morn, MD  hydrochlorothiazide (HYDRODIURIL) 12.5 MG tablet Take 1 tablet (12.5 mg total) by mouth daily. 03/04/23   McGowen, Maryjean Morn, MD  metaxalone (SKELAXIN) 400 MG tablet 1 tab po tid for muscle spasms 01/16/23   McGowen, Maryjean Morn, MD  metFORMIN (GLUCOPHAGE) 1000 MG tablet Take 1 tablet (1,000 mg total) by mouth 2 (two) times daily with a meal. 02/18/21   McGowen, Maryjean Morn, MD  metoprolol succinate (TOPROL-XL) 50 MG 24 hr tablet Take 3 tablets (150 mg  total) by mouth daily. Take with or immediately following a meal. 10/31/22   McGowen, Maryjean Morn, MD  nitroGLYCERIN (NITRODUR - DOSED IN MG/24 HR) 0.2 mg/hr patch Cut and apply 1/4 patch to most painful area q24h. 12/12/22   Monica Becton, MD  rosuvastatin (CRESTOR) 20 MG tablet Take 1 tablet (20 mg total) by mouth daily. Patient taking differently: Take 40 mg by mouth daily. 12/12/22 12/07/23  Revankar, Aundra Dubin, MD      Allergies    Definity [perflutren lipid microsphere] and Codeine    Review of Systems   Review of Systems  Physical Exam Updated Vital Signs BP 110/68   Pulse 68   Temp 98 F (36.7 C) (Oral)   Resp 17   LMP 11/29/2013   SpO2 99%  Physical Exam Vitals and nursing note reviewed.  Constitutional:      General: She is not in acute distress.    Appearance: She is well-developed.  HENT:     Head: Normocephalic and atraumatic.     Right Ear: External ear normal.     Left Ear: External ear normal.     Nose: Nose normal.  Eyes:     Extraocular Movements: Extraocular movements intact.     Conjunctiva/sclera: Conjunctivae normal.     Pupils: Pupils are equal, round, and reactive to light.  Cardiovascular:     Rate and Rhythm: Normal rate and regular rhythm.     Heart sounds:  No murmur heard.    Comments: Radial pulses 2+ bilaterally Pulmonary:     Effort: Pulmonary effort is normal. No respiratory distress.     Breath sounds: Normal breath sounds.  Abdominal:     General: Abdomen is flat. There is no distension.     Palpations: Abdomen is soft. There is no mass.     Tenderness: There is no abdominal tenderness. There is no guarding.  Musculoskeletal:     Cervical back: Normal range of motion and neck supple.     Right lower leg: No edema.     Left lower leg: No edema.  Skin:    General: Skin is warm and dry.  Neurological:     Mental Status: She is alert and oriented to person, place, and time. Mental status is at baseline.  Psychiatric:        Mood and  Affect: Mood normal.     ED Results / Procedures / Treatments   Labs (all labs ordered are listed, but only abnormal results are displayed) Labs Reviewed  BASIC METABOLIC PANEL - Abnormal; Notable for the following components:      Result Value   Glucose, Bld 188 (*)    BUN 26 (*)    All other components within normal limits  CBC  LIPASE, BLOOD  HEPATIC FUNCTION PANEL  TSH  T4, FREE  MAGNESIUM  TROPONIN I (HIGH SENSITIVITY)  TROPONIN I (HIGH SENSITIVITY)    EKG EKG Interpretation Date/Time:  Thursday March 05 2023 13:26:35 EST Ventricular Rate:  62 PR Interval:  153 QRS Duration:  100 QT Interval:  457 QTC Calculation: 461 R Axis:   -19  Text Interpretation: Sinus rhythm Borderline left axis deviation Borderline T abnormalities, inferior leads 12 Lead; Mason-Likar COPY Confirmed by Meridee Score [phone]) on 03/05/2023 4:07:38 PM  Radiology US Abdomen Limited RUQ (LIVER/GB) Result Date: 03/05/2023 CLINICAL DATA:  Right upper quadrant pain EXAM: ULTRASOUND ABDOMEN LIMITED RIGHT UPPER QUADRANT COMPARISON:  Ultrasound 09/19/2009. FINDINGS: Gallbladder: Distended gallbladder. Dependent shadowing stones towards the neck. No wall thickening or adjacent fluid. No reported sonographic Murphy's sign. Common bile duct: Diameter: 3 mm Liver: Echogenic hepatic parenchyma consistent with fatty liver infiltration. There is a hypoechoic areas centrally on the right side measuring 2.4 x 1.3 x 1.9 cm. Portal vein is patent on color Doppler imaging with normal direction of blood flow towards the liver. Other: None. IMPRESSION: Distended gallbladder with dependent stone towards the neck. No ductal dilatation. No reported sonographic Murphy's sign or other findings of acute cholecystitis. If there is further concern a HIDA scan could be considered. Fatty liver infiltration. Hypoechoic area in the right hepatic lobe measuring 2.4 cm. This has a differential including focal fatty sparing although  an underlying lesion is possible recommend confirmatory study with MRI when clinically appropriate Electronically Signed   By: Karen Kays M.D.   On: 03/05/2023 17:44   DG Chest 2 View Result Date: 03/05/2023 CLINICAL DATA:  Chest pain EXAM: CHEST - 2 VIEW COMPARISON:  09/15/2022 FINDINGS: Midline trachea. Normal heart size and mediastinal contours. No pleural effusion or pneumothorax. Clear lungs. IMPRESSION: No active cardiopulmonary disease. Electronically Signed   By: Jeronimo Greaves M.D.   On: 03/05/2023 16:58    Procedures Procedures    Medications Ordered in ED Medications - No data to display  ED Course/ Medical Decision Making/ A&P Clinical Course as of 03/05/23 1939  Thu Mar 05, 2023  1542 Signed out to Dr Charm Barges [RP]    Clinical  Course User Index [RP] Rondel Baton, MD                                 Medical Decision Making Amount and/or Complexity of Data Reviewed Labs: ordered. Radiology: ordered.   MORAYMA GOMOLKA is a 52 y.o. female with comorbidities that complicate the patient evaluation including cholelithiasis, diabetes, hypertension, and hyperlipidemia who presents to the emergency department with epigastric abdominal pain and lightheadedness.    Initial Ddx:  MI, cholecystitis, pancreatitis, bradycardia, heart block, sinus bradycardia, hypothyroidism, electrolyte abnormality  MDM/Course:  Patient presents to the emergency department with epigastric/lower chest pain that radiated to her back.  Does have a history of gallstones.  Also reported that she was bradycardic and lightheaded at the time of this happening.  Unfortunately her heart rate was taken via her smart watch which is very loosely tied to her wrist and I do have some concerns about the accuracy of this device.  On EKG is normal sinus rhythm.  Initial troponin was WNL.  Chest x-ray without acute findings.  Thyroid studies within normal limits.  Signed out to the oncoming provider awaiting  results of her ultrasound as well as second troponin.  If all is normal we will have her follow-up with cardiology for Holter monitor and her primary doctor.   This patient presents to the ED for concern of complaints listed in HPI, this involves an extensive number of treatment options, and is a complaint that carries with it a high risk of complications and morbidity. Disposition including potential need for admission considered.   Dispo: Pending remainder of workup  Additional history obtained from spouse Records reviewed Outpatient Clinic Notes The following labs were independently interpreted: Chemistry and show Hyperglycemia I independently reviewed the following imaging with scope of interpretation limited to determining acute life threatening conditions related to emergency care: Chest x-ray and agree with the radiologist interpretation with the following exceptions: none I personally reviewed and interpreted cardiac monitoring: normal sinus rhythm  I personally reviewed and interpreted the pt's EKG: see above for interpretation  I have reviewed the patients home medications and made adjustments as needed  Portions of this note were generated with Dragon dictation software. Dictation errors may occur despite best attempts at proofreading.     Final Clinical Impression(s) / ED Diagnoses Final diagnoses:  Chest pain, unspecified type  Light headedness  Bradycardia  Cholelithiasis without cholecystitis    Rx / DC Orders ED Discharge Orders          Ordered    Ambulatory referral to Cardiology        03/05/23 1535              Rondel Baton, MD 03/05/23 1939

## 2023-03-05 NOTE — Discharge Instructions (Addendum)
You were seen for your stomach/chest pain and low heart rate in the emergency department.   At home, please take Tylenol and aspirin for your pain.    Follow-up with your primary doctor in 2-3 days regarding your visit.  Cardiology will be calling you regarding an appointment within the next 72 hours.  You may contact them if you do not hear from them in that time using the information in this packet.  Please talk to them to see if you need an outpatient (Holter) monitor.  Return immediately to the emergency department if you experience any of the following: Chest pain, shortness of breath, fainting, or any other concerning symptoms.    Thank you for visiting our Emergency Department. It was a pleasure taking care of you today.

## 2023-03-05 NOTE — ED Provider Notes (Signed)
Signout from Dr. Jarold Motto.  52 year old female here with upper abdominal pain lower chest pain dizziness nausea.  Has a history of gallstones.  Possibly had some bradycardia.  Plan is follow-up on patient's ultrasound and second troponin.  If no acute findings likely can follow-up outpatient with her treatment team. Physical Exam  BP 117/77 (BP Location: Right Arm)   Pulse (!) 55   Temp 98 F (36.7 C) (Oral)   Resp 16   LMP 11/29/2013   SpO2 100%   Physical Exam  Procedures  Procedures  ED Course / MDM   Clinical Course as of 03/05/23 1544  Thu Mar 05, 2023  1542 Signed out to Dr Charm Barges [RP]    Clinical Course User Index [RP] Rondel Baton, MD   Medical Decision Making Amount and/or Complexity of Data Reviewed Labs: ordered. Radiology: ordered.   Patient has had no further pain since she has been here and no episodes of bradycardia.  Ultrasound showing gallstone near the neck but no signs of gallbladder thickening or sonographic Murphy's.  I reviewed this with patient.  Will give her number for surgery outpatient to follow-up with.  Return instructions discussed       Terrilee Files, MD 03/05/23 1755

## 2023-03-05 NOTE — ED Triage Notes (Addendum)
Pt is here for upper abdominal pain/CP which began this am around 9.  Pt has had nausea with this and the pain has been in waves. Pt also reports lightheaded with this.

## 2023-03-05 NOTE — ED Notes (Signed)
Pt denies abd pain or SOB, but noted to have centralized chest burning sensation that started this morning. No LOC, AMS, dizziness, or n/v/d.

## 2023-03-05 NOTE — ED Notes (Signed)
Patient ambulated to bathroom without assistance. 

## 2023-03-13 DIAGNOSIS — E1165 Type 2 diabetes mellitus with hyperglycemia: Secondary | ICD-10-CM | POA: Diagnosis not present

## 2023-03-16 ENCOUNTER — Other Ambulatory Visit: Payer: Self-pay | Admitting: Family Medicine

## 2023-03-20 ENCOUNTER — Encounter: Payer: Self-pay | Admitting: Family Medicine

## 2023-03-20 ENCOUNTER — Ambulatory Visit: Payer: BC Managed Care – PPO | Admitting: Family Medicine

## 2023-03-20 VITALS — BP 117/78 | HR 70 | Wt 214.8 lb

## 2023-03-20 DIAGNOSIS — K8050 Calculus of bile duct without cholangitis or cholecystitis without obstruction: Secondary | ICD-10-CM

## 2023-03-20 DIAGNOSIS — R001 Bradycardia, unspecified: Secondary | ICD-10-CM | POA: Diagnosis not present

## 2023-03-20 DIAGNOSIS — R55 Syncope and collapse: Secondary | ICD-10-CM

## 2023-03-20 DIAGNOSIS — K8020 Calculus of gallbladder without cholecystitis without obstruction: Secondary | ICD-10-CM | POA: Diagnosis not present

## 2023-03-20 DIAGNOSIS — R42 Dizziness and giddiness: Secondary | ICD-10-CM

## 2023-03-20 NOTE — Progress Notes (Signed)
 OFFICE VISIT  03/23/2023  CC:  Chief Complaint  Patient presents with   Hospitalization Follow-up    E/D F/U. Abdominal pain, nausea, lightheadedness. No new meds.     Patient is a 53 y.o. female who presents for follow-up emergency department visit 03/05/2023. I reviewed this encounter in its entirety today (med [employer]). She presented with epigastric pain that radiated to her back and upper chest.  She did have some bradycardia on her fitness watch during the time of pain.  Had some lightheadedness but no presyncope. She does have a known diagnosis of cholelithiasis. Chest x-ray negative acute. EKG was normal sinus rhythm, rate 62, no ischemic changes. Troponin negative x 2.  Complete metabolic panel, CBC, lipase, thyroid  panel, and magnesium all normal. RUQ ultrasound result: IMPRESSION: Distended gallbladder with dependent stone towards the neck. No ductal dilatation. No reported sonographic Murphy's sign or other findings of acute cholecystitis. If there is further concern a HIDA scan could be considered. Fatty liver infiltration. Hypoechoic area in the right hepatic lobe measuring 2.4 cm. This has a differential including focal fatty sparing although an underlying lesion is possible recommend confirmatory study with MRI when clinically appropriate  She was discharged home with cardiology referral ordered.  INTERIM HX: Kristen Meyers has not had any recurrence of her epigastric pain.  No nausea or vomiting. She does feel some intermittent feeling of reflux.  No further feelings of dizziness, no more bradycardia on fitness watch. No chest pain, shortness of breath, diarrhea, or hematochezia.  Past Medical History:  Diagnosis Date   Achilles tendonosis of left lower extremity    Acute DVT (deep venous thrombosis) (HCC)    DVT 2012-january, L leg, in the context of post-C section. Again 2021, L leg, dx'd shortly after having covid infection->took eliquis  x 61mo.    Cholelithiasis without obstruction 09/19/09 (u/s)   Chronic elbow pain, right    Dr. ONEIDA, MRI 2024->tendonopathy/partial tear of distal biceps tendon, thinning or partial tear of the lateral ulnar collateral ligament, spurring of the radial head and capitellum.   COVID-19 virus infection    approx 11/09/19---got antibody infusion   Diabetes mellitus without complication (HCC) 03/21/2010   Elevated transaminase level 07/19/2012   Essential hypertension 03/21/2010   Qualifier: Diagnosis of   By: Lavinia LPN, Inocente [facility] liver 09/19/09 (u/s)   Mildly elevated transaminases   GERD 03/21/2010   History of adenomatous polyp of colon 12/2018   2020.  Normal 01/14/22. Recall 5 yrs   History of herpes zoster 03/2012   Hyperlipidemia 03/21/2010   Qualifier: Diagnosis of   By: Lavinia LPN, Inocente         Melanocytic nevi, unspecified 12/30/2018   NAFLD (nonalcoholic fatty liver disease) 90/96/7985   Obesity, Class II, BMI 35-39.9    Paresthesia of left arm 09/30/2012   Seizures (HCC)    as child- had 2 none since- no current treatment- never dx'd with any seizure d/o    Subclinical hypothyroidism 2021   2021-22->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  01/14/22 no polyps. Recall 5 yrs.   Coronary calcium  score  10/2022   Score 26, 90th%'tile   LAPAROSCOPIC HYSTERECTOMY  02/2014   Ovaries remain (tubes out): done for  DUB   TRANSTHORACIC ECHOCARDIOGRAM     11/2022 EF 55-60%, grd I DD, AV sclerosis w/out stenosis.   TUBAL LIGATION  2011   Dr. Thyra is her GYN.   WISDOM TOOTH EXTRACTION      Outpatient Medications Prior to Visit  Medication Sig Dispense Refill   celecoxib  (CELEBREX ) 100 MG capsule 1 capsule p.o. 2-3 times daily 90 capsule 11   dapagliflozin  propanediol (FARXIGA ) 10 MG TABS tablet TAKE 1 TABLET DAILY  90 tablet 0   enalapril  (VASOTEC ) 20 MG tablet TAKE 2 TABLETS DAILY 180 tablet 3   hydrochlorothiazide  (HYDRODIURIL ) 12.5 MG tablet Take 1 tablet (12.5 mg total) by mouth daily. 90 tablet 0   metaxalone  (SKELAXIN ) 400 MG tablet 1 tab po tid for muscle spasms 30 tablet 1   metFORMIN  (GLUCOPHAGE ) 1000 MG tablet Take 1 tablet (1,000 mg total) by mouth 2 (two) times daily with a meal. 180 tablet 3   metoprolol  succinate (TOPROL -XL) 50 MG 24 hr tablet Take 3 tablets (150 mg total) by mouth daily. Take with or immediately following a meal. 270 tablet 1   nitroGLYCERIN  (NITRODUR - DOSED IN MG/24 HR) 0.2 mg/hr patch Cut and apply 1/4 patch to most painful area q24h. 30 patch 11   rosuvastatin  (CRESTOR ) 20 MG tablet Take 1 tablet (20 mg total) by mouth daily. (Patient taking differently: Take 40 mg by mouth daily.) 90 tablet 3   No facility-administered medications prior to visit.    Allergies  Allergen Reactions   Definity  [Perflutren  Lipid Microsphere] Hypertension     shoulder pain, back pain , chills and HTN   Codeine Hives    Review of Systems As per HPI  PE:    03/20/2023    3:13 PM 03/05/2023    3:45 PM 03/05/2023    1:08 PM  Vitals with BMI  Weight 214 lbs 13 oz    Systolic 117 110 882  Diastolic 78 68 77  Pulse 70 68 55     Physical Exam  Gen: Alert, well appearing.  Patient is oriented to person, place, time, and situation. AFFECT: pleasant, lucid thought and speech. RRR, no murmur Abd: soft, NT/ND  LABS:  Last CBC Lab Results  Component Value Date   WBC 7.2 03/05/2023   HGB 13.8 03/05/2023   HCT 41.7 03/05/2023   MCV 86.0 03/05/2023   MCH 28.5 03/05/2023   RDW 13.2 03/05/2023   PLT 256 03/05/2023   Last metabolic panel Lab Results  Component Value Date   GLUCOSE 188 (H) 03/05/2023   NA 135 03/05/2023   K 4.2 03/05/2023   CL 101 03/05/2023   CO2 24 03/05/2023   BUN 26 (H) 03/05/2023   CREATININE 0.73 03/05/2023   GFRNONAA >60 03/05/2023   CALCIUM  9.1  03/05/2023   PROT 7.3 03/05/2023   ALBUMIN 4.3 03/05/2023   LABGLOB 2.0 11/10/2022   BILITOT 0.7 03/05/2023   ALKPHOS 60 03/05/2023   AST 26 03/05/2023   ALT 35 03/05/2023   ANIONGAP 10 03/05/2023   Last thyroid  functions Lab Results  Component Value Date   TSH 2.645 03/05/2023   T3TOTAL 138 05/14/2022   Lab Results  Component Value Date   LIPASE 40 03/05/2023   IMPRESSION AND PLAN:  #1 epigastric pain. Suspect episode of biliary colic.  No sign of acute cholecystitis. Discussed today.  Signs/symptoms to call or return for were reviewed and pt expressed understanding. Hold off on surgery consultation at this time.  2.  #2 bradycardia. I  think she had a vagal response to the acute severe epigastric pain she was having at the time of the bradycardia.   Resolved. Reassured. Obs. Signs/symptoms to call or return for were reviewed and pt expressed understanding.  An After Visit Summary was printed and given to the patient.  FOLLOW UP: Return if symptoms worsen or fail to improve.  Signed:  Gerlene Hockey, MD           03/23/2023

## 2023-06-11 DIAGNOSIS — E1165 Type 2 diabetes mellitus with hyperglycemia: Secondary | ICD-10-CM | POA: Diagnosis not present

## 2023-06-11 DIAGNOSIS — Z978 Presence of other specified devices: Secondary | ICD-10-CM | POA: Diagnosis not present

## 2023-06-19 ENCOUNTER — Encounter: Payer: Self-pay | Admitting: Family Medicine

## 2023-06-19 NOTE — Telephone Encounter (Signed)
 Decrease metoprolol to ONE of the 50mg  tabs daily. Continue 2 of the enalapril tabs per day and 1 of the hydrochlorothiazide tabs per day. F/u needed in 1-2 mo.

## 2023-06-28 ENCOUNTER — Other Ambulatory Visit: Payer: Self-pay | Admitting: Family Medicine

## 2023-07-23 DIAGNOSIS — M6788 Other specified disorders of synovium and tendon, other site: Secondary | ICD-10-CM | POA: Insufficient documentation

## 2023-07-24 ENCOUNTER — Encounter: Payer: Self-pay | Admitting: Cardiology

## 2023-07-24 ENCOUNTER — Ambulatory Visit: Attending: Cardiology | Admitting: Cardiology

## 2023-07-24 VITALS — BP 136/76 | HR 80 | Ht 63.0 in | Wt 186.1 lb

## 2023-07-24 DIAGNOSIS — E782 Mixed hyperlipidemia: Secondary | ICD-10-CM

## 2023-07-24 DIAGNOSIS — I10 Essential (primary) hypertension: Secondary | ICD-10-CM | POA: Diagnosis not present

## 2023-07-24 DIAGNOSIS — E66811 Obesity, class 1: Secondary | ICD-10-CM | POA: Diagnosis not present

## 2023-07-24 DIAGNOSIS — E119 Type 2 diabetes mellitus without complications: Secondary | ICD-10-CM | POA: Diagnosis not present

## 2023-07-24 NOTE — Patient Instructions (Signed)
 Medication Instructions:  Your physician recommends that you continue on your current medications as directed. Please refer to the Current Medication list given to you today.  *If you need a refill on your cardiac medications before your next appointment, please call your pharmacy*  Lab Work: Your physician recommends that you return for lab work in:   Labs in the next few days: BMP, CBC, TSH, LFT, Lipids, Vitamin D, Hbg A1c  If you have labs (blood work) drawn today and your tests are completely normal, you will receive your results only by: MyChart Message (if you have MyChart) OR A paper copy in the mail If you have any lab test that is abnormal or we need to change your treatment, we will call you to review the results.  Testing/Procedures: None  Follow-Up: At [facility], you and your health needs are our priority.  As part of our continuing mission to provide you with exceptional heart care, our providers are all part of one team.  This team includes your primary Cardiologist (physician) and Advanced Practice Providers or APPs (Physician Assistants and Nurse Practitioners) who all work together to provide you with the care you need, when you need it.  Your next appointment:   1 year(s)  Provider:   Hillis Lu, MD    We recommend signing up for the patient portal called "MyChart".  Sign up information is provided on this After Visit Summary.  MyChart is used to connect with patients for Virtual Visits (Telemedicine).  Patients are able to view lab/test results, encounter notes, upcoming appointments, etc.  Non-urgent messages can be sent to your provider as well.   To learn more about what you can do with MyChart, go to [email or web address].   Other Instructions None

## 2023-07-24 NOTE — Progress Notes (Signed)
 Cardiology Office Note:    Date:  07/24/2023   ID:  Kristen Meyers, DOB [birth year], MRN [id number]  PCP:  Shelvia Dick, MD  Cardiologist:  Nelia Balzarine, MD   Referring MD: Shelvia Dick, MD    ASSESSMENT:    1. Essential hypertension   2. Diabetes mellitus without complication (HCC)   3. Mixed hyperlipidemia   4. Obesity (BMI 30.0-34.9)    PLAN:    In order of problems listed above:  Elevated calcium  score: Secondary prevention stressed with the patient.  Importance of compliance with diet medication stressed and she vocalized understanding.  She was advised to walk at least half an hour a day on a daily basis. Essential hypertension: Blood pressure is stable and diet was emphasized.  Lifestyle modification urged. Mixed dyslipidemia: On lipid-lowering medications followed by primary care.  She will come for blood work in the next few days.  Goal LDL less than 60. Diabetes mellitus and obesity: Weight reduction stressed.  Lifestyle modification urged and she promises to do better.  Risks of obesity discussed.  She is doing her best to lose weight. Patient will be seen in follow-up appointment in 6 months or earlier if the patient has any concerns.    Medication Adjustments/Labs and Tests Ordered: Current medicines are reviewed at length with the patient today.  Concerns regarding medicines are outlined above.  Orders Placed This Encounter  Procedures   Basic Metabolic Panel (BMET)   CBC   TSH   Hepatic function panel   Lipid Profile   Vitamin D (25 hydroxy)   HgB A1c   No orders of the defined types were placed in this encounter.    No chief complaint on file.    History of Present Illness:    Kristen Meyers is a 53 y.o. female.  Patient has past medical history of elevated calcium  score, essential hypertension, mixed dyslipidemia and diabetes mellitus.  She is not bleeding on active lifestyle and has lost weight.  She denies any chest pain orthopnea  or PND.  She is on a diet.  She is happy about it.  At the time of my evaluation, the patient is alert awake oriented and in no distress.  Past Medical History:  Diagnosis Date   Achilles tendonosis of left lower extremity    Acute DVT (deep venous thrombosis) (HCC)    DVT 2012-january, L leg, in the context of post-C section. Again 2021, L leg, dx'd shortly after having covid infection->took eliquis  x 1mo.   Cardiac murmur 10/24/2022   Chest pain of uncertain etiology 10/24/2022   Cholelithiasis without obstruction 09/19/09 (u/s)   Chronic elbow pain, right    Dr. Elva Hamburger, MRI 2024->tendonopathy/partial tear of distal biceps tendon, thinning or partial tear of the lateral ulnar collateral ligament, spurring of the radial head and capitellum.   COVID-19 virus infection    approx 11/09/19---got antibody infusion   Deep venous thrombosis (HCC) 02/14/2020   Diabetes mellitus without complication (HCC) 03/21/2010   Elevated transaminase level 07/19/2012   Essential hypertension 03/21/2010   Qualifier: Diagnosis of   By: Ivy Marseilles LPN, Gaylin Ke liver 09/19/09 (u/s)   Mildly elevated transaminases   GERD 03/21/2010   Health maintenance examination 07/20/2013   History of adenomatous polyp of colon 12/2018   2020.  Normal 01/14/22. Recall 5 yrs   History of herpes zoster 03/2012   History of laparoscopic-assisted vaginal hysterectomy 02/14/2020   Hyperlipidemia  03/21/2010   Qualifier: Diagnosis of   By: Ivy Marseilles LPN, Nancy         Left leg pain 12/11/2022   Left shoulder pain 09/30/2012   Low back pain 09/30/2012   Melanocytic nevi, unspecified 12/30/2018   NAFLD (nonalcoholic fatty liver disease) 40/98/1191   Obesity, Class II, BMI 35-39.9    Obesity, morbid (HCC) 10/19/2012   [facility]     Paresthesia of left arm 09/30/2012   Seizures (HCC)    as child- had 2 none since- no current treatment- never dx'd with any seizure d/o    Subclinical hypothyroidism 2021   2021-22->multinod  goiter on u/s 01/2021    Past Surgical History:  Procedure Laterality Date   CARDIOVASCULAR STRESS TEST     MPI 11/2022 normal   CESAREAN SECTION     2003, 2005   COLONOSCOPY  01/05/2019   2020 polyps.  01/14/22 no polyps. Recall 5 yrs.   Coronary calcium  score  10/2022   Score 26, 90th%'tile   LAPAROSCOPIC HYSTERECTOMY  02/2014   Ovaries remain (tubes out): done for DUB   TRANSTHORACIC ECHOCARDIOGRAM     11/2022 EF 55-60%, grd I DD, AV sclerosis w/out stenosis.   TUBAL LIGATION  2011   Dr. Lucina Sabal is her GYN.   WISDOM TOOTH EXTRACTION      Current Medications: Current Meds  Medication Sig   celecoxib  (CELEBREX ) 100 MG capsule 1 capsule p.o. 2-3 times daily   dapagliflozin  propanediol (FARXIGA ) 10 MG TABS tablet TAKE 1 TABLET DAILY   enalapril  (VASOTEC ) 20 MG tablet TAKE 2 TABLETS DAILY   hydrochlorothiazide  (HYDRODIURIL ) 12.5 MG tablet Take 1 tablet (12.5 mg total) by mouth daily.   metoprolol  succinate (TOPROL -XL) 50 MG 24 hr tablet Take 50 mg by mouth daily.   nitroGLYCERIN  (NITRODUR - DOSED IN MG/24 HR) 0.2 mg/hr patch Cut and apply 1/4 patch to most painful area q24h.   rosuvastatin  (CRESTOR ) 20 MG tablet Take 1 tablet (20 mg total) by mouth daily. (Patient taking differently: Take 40 mg by mouth daily.)     Allergies:   Definity  [perflutren  lipid microsphere] and Codeine   Social History   Socioeconomic History   Marital status: Married    Spouse name: Not on file   Number of children: Not on file   Years of education: Not on file   Highest education level: Bachelor's degree (e.g., BA, AB, BS)  Occupational History   Not on file  Tobacco Use   Smoking status: Never   Smokeless tobacco: Never  Vaping Use   Vaping status: Never Used  Substance and Sexual Activity   Alcohol use: Never   Drug use: Never   Sexual activity: Yes    Birth control/protection: Surgical  Other Topics Concern   Not on file  Social History Narrative   Married, 1 child.   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   Orig from [place], currently living in [place].   No T/A/Ds.   Exercise: 1- 3 days a week, steps + cardio.   Social Drivers of Corporate investment banker Strain: Low Risk  (03/17/2023)   Overall Financial Resource Strain (CARDIA)    Difficulty of Paying Living Expenses: Not hard at all  Food Insecurity: No Food Insecurity (03/17/2023)   Hunger Vital Sign    Worried About Running Out of Food in the Last Year: Never true    Ran Out of Food in the Last Year: Never true  Transportation Needs: No  Transportation Needs (03/17/2023)   PRAPARE - Administrator, Civil Service (Medical): No    Lack of Transportation (Non-Medical): No  Physical Activity: Insufficiently Active (03/17/2023)   Exercise Vital Sign    Days of Exercise per Week: 3 days    Minutes of Exercise per Session: 30 min  Stress: No Stress Concern Present (03/17/2023)   [employer] of Occupational Health - Occupational Stress Questionnaire    Feeling of Stress : Not at all  Social Connections: Socially Integrated (03/17/2023)   Social Connection and Isolation Panel [NHANES]    Frequency of Communication with Friends and Family: Three times a week    Frequency of Social Gatherings with Friends and Family: More than three times a week    Attends Religious Services: More than 4 times per year    Active Member of [employer] or Organizations: Yes    Attends Engineer, structural: More than 4 times per year    Marital Status: Married     Family History: The patient's family history includes Breast cancer in her mother; Cancer in her mother; Colon cancer in her maternal uncle; Diabetes in her mother; Heart disease in her mother; Hyperlipidemia in her mother; Hypertension in her mother; Lung cancer in her mother; Ovarian cancer in her mother. There is no history of Rectal cancer, Stomach cancer, Colon polyps, or Esophageal cancer.  ROS:    Please see the history of present illness.    All other systems reviewed and are negative.  EKGs/Labs/Other Studies Reviewed:    The following studies were reviewed today: I discussed my findings with the patient at length   Recent Labs: 03/05/2023: ALT 35; BUN 26; Creatinine, Ser 0.73; Hemoglobin 13.8; Magnesium 2.0; Platelets 256; Potassium 4.2; Sodium 135; TSH 2.645  Recent Lipid Panel    Component Value Date/Time   CHOL 159 12/10/2022 0842   TRIG 126 12/10/2022 0842   HDL 41 12/10/2022 0842   CHOLHDL 3.9 12/10/2022 0842   CHOLHDL 4 05/14/2022 0835   VLDL 37.2 05/14/2022 0835   LDLCALC 95 12/10/2022 0842   LDLDIRECT 140.0 09/24/2020 1058    Physical Exam:    VS:  BP 136/76   Pulse 80   Ht 5\' 3"  (1.6 m)   Wt 186 lb 1.9 oz (84.4 kg)   LMP 11/29/2013   SpO2 98%   BMI 32.97 kg/m     Wt Readings from Last 3 Encounters:  07/24/23 186 lb 1.9 oz (84.4 kg)  03/20/23 214 lb 12.8 oz (97.4 kg)  01/16/23 208 lb (94.3 kg)     GEN: Patient is in no acute distress HEENT: Normal NECK: No JVD; No carotid bruits LYMPHATICS: No lymphadenopathy CARDIAC: Hear sounds regular, 2/6 systolic murmur at the apex. RESPIRATORY:  Clear to auscultation without rales, wheezing or rhonchi  ABDOMEN: Soft, non-tender, non-distended MUSCULOSKELETAL:  No edema; No deformity  SKIN: Warm and dry NEUROLOGIC:  Alert and oriented x 3 PSYCHIATRIC:  Normal affect   Signed, Nelia Balzarine, MD  07/24/2023 1:50 PM    [facility]

## 2023-07-28 DIAGNOSIS — E782 Mixed hyperlipidemia: Secondary | ICD-10-CM | POA: Diagnosis not present

## 2023-07-28 DIAGNOSIS — E66811 Obesity, class 1: Secondary | ICD-10-CM | POA: Diagnosis not present

## 2023-07-28 DIAGNOSIS — Z1321 Encounter for screening for nutritional disorder: Secondary | ICD-10-CM | POA: Diagnosis not present

## 2023-07-28 DIAGNOSIS — I10 Essential (primary) hypertension: Secondary | ICD-10-CM | POA: Diagnosis not present

## 2023-07-28 DIAGNOSIS — E119 Type 2 diabetes mellitus without complications: Secondary | ICD-10-CM | POA: Diagnosis not present

## 2023-07-29 ENCOUNTER — Encounter: Payer: Self-pay | Admitting: Family Medicine

## 2023-07-29 LAB — HEPATIC FUNCTION PANEL
ALT: 19 IU/L (ref 0–32)
AST: 17 IU/L (ref 0–40)
Albumin: 4.5 g/dL (ref 3.8–4.9)
Alkaline Phosphatase: 67 IU/L (ref 44–121)
Bilirubin Total: 0.4 mg/dL (ref 0.0–1.2)
Bilirubin, Direct: 0.18 mg/dL (ref 0.00–0.40)
Total Protein: 6.7 g/dL (ref 6.0–8.5)

## 2023-07-29 LAB — CBC
Hematocrit: 41.7 % (ref 34.0–46.6)
Hemoglobin: 13.8 g/dL (ref 11.1–15.9)
MCH: 29.4 pg (ref 26.6–33.0)
MCHC: 33.1 g/dL (ref 31.5–35.7)
MCV: 89 fL (ref 79–97)
Platelets: 187 10*3/uL (ref 150–450)
RBC: 4.7 x10E6/uL (ref 3.77–5.28)
RDW: 13.6 % (ref 11.7–15.4)
WBC: 3.8 10*3/uL (ref 3.4–10.8)

## 2023-07-29 LAB — BASIC METABOLIC PANEL WITH GFR
BUN/Creatinine Ratio: 35 — ABNORMAL HIGH (ref 9–23)
BUN: 25 mg/dL — ABNORMAL HIGH (ref 6–24)
CO2: 22 mmol/L (ref 20–29)
Calcium: 9.5 mg/dL (ref 8.7–10.2)
Chloride: 103 mmol/L (ref 96–106)
Creatinine, Ser: 0.72 mg/dL (ref 0.57–1.00)
Glucose: 157 mg/dL — ABNORMAL HIGH (ref 70–99)
Potassium: 4.6 mmol/L (ref 3.5–5.2)
Sodium: 140 mmol/L (ref 134–144)
eGFR: 99 mL/min/{1.73_m2} (ref >59)

## 2023-07-29 LAB — LIPID PANEL
Chol/HDL Ratio: 5.2 ratio — ABNORMAL HIGH (ref 0.0–4.4)
Cholesterol, Total: 202 mg/dL — ABNORMAL HIGH (ref 100–199)
HDL: 39 mg/dL — ABNORMAL LOW (ref >39)
LDL Chol Calc (NIH): 144 mg/dL — ABNORMAL HIGH (ref 0–99)
Triglycerides: 106 mg/dL (ref 0–149)
VLDL Cholesterol Cal: 19 mg/dL (ref 5–40)

## 2023-07-29 LAB — HEMOGLOBIN A1C
Est. average glucose Bld gHb Est-mCnc: 163 mg/dL
Hgb A1c MFr Bld: 7.3 % — ABNORMAL HIGH (ref 4.8–5.6)

## 2023-07-29 LAB — TSH: TSH: 3.69 u[IU]/mL (ref 0.450–4.500)

## 2023-07-29 LAB — VITAMIN D 25 HYDROXY (VIT D DEFICIENCY, FRACTURES): Vit D, 25-Hydroxy: 39.9 ng/mL (ref 30.0–100.0)

## 2023-08-04 ENCOUNTER — Ambulatory Visit: Payer: Self-pay | Admitting: Cardiology

## 2023-08-04 DIAGNOSIS — E785 Hyperlipidemia, unspecified: Secondary | ICD-10-CM

## 2023-08-05 MED ORDER — ROSUVASTATIN CALCIUM 40 MG PO TABS: 40.0000 mg | ORAL_TABLET | Freq: Every day | ORAL | 3 refills | Status: DC

## 2023-08-05 NOTE — Addendum Note (Signed)
 Addended by: Einar Grave on: 08/05/2023 04:09 PM   Modules accepted: Orders

## 2023-08-05 NOTE — Telephone Encounter (Signed)
-----   Message from [place] R Revankar sent at 08/04/2023 11:40 PM EDT ----- Kristen Meyers low-cholesterol diet sheet, double statin and liver lipid check in 6 weeks.  Diet and exercise.  Copy primary care Nelia Balzarine, MD 08/04/2023 11:39 PM

## 2023-08-05 NOTE — Telephone Encounter (Signed)
Results reviewed with pt as per Dr. Revankar's note.  Pt verbalized understanding and had no additional questions. Routed to PCP.  

## 2023-08-29 ENCOUNTER — Other Ambulatory Visit: Payer: Self-pay | Admitting: Family Medicine

## 2023-08-30 ENCOUNTER — Other Ambulatory Visit: Payer: Self-pay | Admitting: Family Medicine

## 2023-09-11 DIAGNOSIS — Z6832 Body mass index (BMI) 32.0-32.9, adult: Secondary | ICD-10-CM | POA: Diagnosis not present

## 2023-09-11 DIAGNOSIS — E1165 Type 2 diabetes mellitus with hyperglycemia: Secondary | ICD-10-CM | POA: Diagnosis not present

## 2023-09-11 DIAGNOSIS — Z978 Presence of other specified devices: Secondary | ICD-10-CM | POA: Diagnosis not present

## 2023-09-25 DIAGNOSIS — H5203 Hypermetropia, bilateral: Secondary | ICD-10-CM | POA: Diagnosis not present

## 2023-09-25 DIAGNOSIS — Z7984 Long term (current) use of oral hypoglycemic drugs: Secondary | ICD-10-CM | POA: Diagnosis not present

## 2023-09-25 DIAGNOSIS — E119 Type 2 diabetes mellitus without complications: Secondary | ICD-10-CM | POA: Diagnosis not present

## 2023-09-25 LAB — HM DIABETES EYE EXAM

## 2023-10-02 ENCOUNTER — Ambulatory Visit: Admitting: Family Medicine

## 2023-10-02 ENCOUNTER — Encounter: Payer: Self-pay | Admitting: Family Medicine

## 2023-10-02 VITALS — BP 138/84 | HR 69 | Temp 98.2°F | Ht 63.0 in | Wt 182.2 lb

## 2023-10-02 DIAGNOSIS — Z0000 Encounter for general adult medical examination without abnormal findings: Secondary | ICD-10-CM

## 2023-10-02 DIAGNOSIS — E7800 Pure hypercholesterolemia, unspecified: Secondary | ICD-10-CM

## 2023-10-02 DIAGNOSIS — I10 Essential (primary) hypertension: Secondary | ICD-10-CM | POA: Diagnosis not present

## 2023-10-02 MED ORDER — HYDROCHLOROTHIAZIDE 12.5 MG PO TABS: 12.5000 mg | ORAL_TABLET | Freq: Every day | ORAL | 1 refills | Status: AC

## 2023-10-02 MED ORDER — ROSUVASTATIN CALCIUM 20 MG PO TABS: 20.0000 mg | ORAL_TABLET | Freq: Every day | ORAL | 1 refills | Status: AC

## 2023-10-02 MED ORDER — ENALAPRIL MALEATE 20 MG PO TABS: 40.0000 mg | ORAL_TABLET | Freq: Every day | ORAL | 1 refills | Status: AC

## 2023-10-02 NOTE — Progress Notes (Signed)
 OFFICE VISIT  10/02/2023  CC: No chief complaint on file.   Patient is a 53 y.o. female who presents for annual health maintenance exam and follow-up hypertension and hypercholesterolemia.  HPI: Feeling well. 06/19/23--> Newell called and reported that she made some healthy lifestyle changes and her blood pressures at home are 100s over 60s.  I recommended that she decrease metoprolol  to ONE of the 50mg  tabs daily. and continue 2 of the enalapril  tabs per day and 1 of the hydrochlorothiazide  tabs per day. Her blood pressures remained normal after this change but she recently ran out of HCTZ and blood pressures have gone up into the 130s over 80s.  She has made significant dietary improvements.   She is happy to say she spent the winter in early spring rehabbing her Achilles and elbow and these are both doing much better.  She plays tennis 3 times a week.  Past Medical History:  Diagnosis Date   Achilles tendonosis of left lower extremity    Acute DVT (deep venous thrombosis) (HCC)    DVT 2012-january, L leg, in the context of post-C section. Again 2021, L leg, dx'd shortly after having covid infection->took eliquis  x 3mo.   Cardiac murmur 10/24/2022   Chest pain of uncertain etiology 10/24/2022   Cholelithiasis without obstruction 09/19/09 (u/s)   Chronic elbow pain, right    Dr. ONEIDA, MRI 2024->tendonopathy/partial tear of distal biceps tendon, thinning or partial tear of the lateral ulnar collateral ligament, spurring of the radial head and capitellum.   COVID-19 virus infection    approx 11/09/19---got antibody infusion   Deep venous thrombosis (HCC) 02/14/2020   Diabetes mellitus without complication (HCC) 03/21/2010   Elevated transaminase level 07/19/2012   Essential hypertension 03/21/2010   Qualifier: Diagnosis of   By: Lavinia LPN, Inocente Elks liver 09/19/09 (u/s)   Mildly elevated transaminases   GERD 03/21/2010   Health maintenance examination 07/20/2013   History of  adenomatous polyp of colon 12/2018   2020.  Normal 01/14/22. Recall 5 yrs   History of herpes zoster 03/2012   History of laparoscopic-assisted vaginal hysterectomy 02/14/2020   Hyperlipidemia 03/21/2010   Qualifier: Diagnosis of   By: Lavinia LPN, Nancy         Left leg pain 12/11/2022   Left shoulder pain 09/30/2012   Low back pain 09/30/2012   Melanocytic nevi, unspecified 12/30/2018   NAFLD (nonalcoholic fatty liver disease) 90/96/7985   Obesity, Class II, BMI 35-39.9    Obesity, morbid (HCC) 10/19/2012   [facility]     Paresthesia of left arm 09/30/2012   Seizures (HCC)    as child- had 2 none since- no current treatment- never dx'd with any seizure d/o    Subclinical hypothyroidism 2021   2021-22->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  01/14/22 no polyps. Recall 5 yrs.   Coronary calcium  score  10/2022   Score 26, 90th%'tile   LAPAROSCOPIC HYSTERECTOMY  02/2014   Ovaries remain (tubes out): done for DUB   TRANSTHORACIC ECHOCARDIOGRAM     11/2022 EF 55-60%, grd I DD, AV sclerosis w/out stenosis.   TUBAL LIGATION  2011   Dr. Thyra is her GYN.   WISDOM TOOTH EXTRACTION  Outpatient Medications Prior to Visit  Medication Sig Dispense Refill   celecoxib  (CELEBREX ) 100 MG capsule 1 capsule p.o. 2-3 times daily 90 capsule 11   Continuous Glucose Sensor (DEXCOM G7 SENSOR) MISC USE 1 SENSOR EVERY 10 DAYS     dapagliflozin  propanediol (FARXIGA ) 10 MG TABS tablet TAKE 1 TABLET DAILY 90 tablet 0   nitroGLYCERIN  (NITRODUR - DOSED IN MG/24 HR) 0.2 mg/hr patch Cut and apply 1/4 patch to most painful area q24h. 30 patch 11   enalapril  (VASOTEC ) 20 MG tablet TAKE 2 TABLETS DAILY 180 tablet 3   hydrochlorothiazide  (HYDRODIURIL ) 12.5 MG tablet TAKE 1 TABLET BY MOUTH EVERY DAY 90 tablet 0   metoprolol   succinate (TOPROL -XL) 50 MG 24 hr tablet TAKE 1 TABLET BY MOUTH DAILY. TAKE WITH OR IMMEDIATELY FOLLOWING A MEAL. 90 tablet 0   rosuvastatin  (CRESTOR ) 40 MG tablet Take 1 tablet (40 mg total) by mouth daily. 90 tablet 3   No facility-administered medications prior to visit.    Allergies  Allergen Reactions   Definity  [Perflutren  Lipid Microsphere] Hypertension     shoulder pain, back pain , chills and HTN   Codeine Hives   Social History   Socioeconomic History   Marital status: Married    Spouse name: Not on file   Number of children: Not on file   Years of education: Not on file   Highest education level: Bachelor's degree (e.g., BA, AB, BS)  Occupational History   Not on file  Tobacco Use   Smoking status: Never   Smokeless tobacco: Never  Vaping Use   Vaping status: Never Used  Substance and Sexual Activity   Alcohol use: Never   Drug use: Never   Sexual activity: Yes    Birth control/protection: Surgical  Other Topics Concern   Not on file  Social History Narrative   Married, 1 child.   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   Orig from [place], currently living in [place].   No T/A/Ds.   Exercise: 1- 3 days a week, steps + cardio.   Social Drivers of Corporate investment banker Strain: Low Risk  (09/30/2023)   Overall Financial Resource Strain (CARDIA)    Difficulty of Paying Living Expenses: Not hard at all  Food Insecurity: No Food Insecurity (09/30/2023)   Hunger Vital Sign    Worried About Running Out of Food in the Last Year: Never true    Ran Out of Food in the Last Year: Never true  Transportation Needs: No Transportation Needs (09/30/2023)   PRAPARE - Administrator, Civil Service (Medical): No    Lack of Transportation (Non-Medical): No  Physical Activity: Sufficiently Active (09/30/2023)   Exercise Vital Sign    Days of Exercise per Week: 4 days    Minutes of Exercise per Session: 40 min   Stress: No Stress Concern Present (09/30/2023)   [employer] of Occupational Health - Occupational Stress Questionnaire    Feeling of Stress: Not at all  Social Connections: Socially Integrated (09/30/2023)   Social Connection and Isolation Panel    Frequency of Communication with Friends and Family: Never    Frequency of Social Gatherings with Friends and Family: Three times a week    Attends Religious Services: More than 4 times per year    Active Member of Clubs or Organizations: Yes    Attends Banker Meetings: Not on file    Marital Status: Married   Family History  Problem  Relation Age of Onset   Cancer Mother        colon- pt is unsure ever had colon cancer, ovarian, lung, breast- breast primary cancer- dx;d in her 51's- she is still alive 12-2018   Heart disease Mother    Hypertension Mother    Hyperlipidemia Mother    Diabetes Mother        type ll   Breast cancer Mother    Ovarian cancer Mother    Lung cancer Mother    Colon cancer Maternal Uncle    Rectal cancer Neg Hx    Stomach cancer Neg Hx    Colon polyps Neg Hx    Esophageal cancer Neg Hx    Review of Systems As per HPI  PE:    10/02/2023    3:46 PM 07/24/2023    1:16 PM 03/20/2023    3:13 PM  Vitals with BMI  Height 5' 3 5' 3   Weight 182 lbs 3 oz 186 lbs 2 oz 214 lbs 13 oz  BMI 32.28 32.98   Systolic 138 136 882  Diastolic 84 76 78  Pulse 69 80 70   Physical Exam  Gen: Alert, well appearing.  Patient is oriented to person, place, time, and situation. AFFECT: pleasant, lucid thought and speech. ENT: Ears: EACs clear, normal epithelium.  TMs with good light reflex and landmarks bilaterally.  Eyes: no injection, icteris, swelling, or exudate.  EOMI, PERRLA. Nose: no drainage or turbinate edema/swelling.  No injection or focal lesion.  Mouth: lips without lesion/swelling.  Oral mucosa pink and moist.  Dentition intact and without obvious caries or gingival swelling.  Oropharynx without  erythema, exudate, or swelling.  Neck: supple/nontender.  No LAD, mass, or TM.  Carotid pulses 2+ bilaterally, without bruits. CV: RRR, no m/r/g.   LUNGS: CTA bilat, nonlabored resps, good aeration in all lung fields. EXT: no clubbing, cyanosis, or edema.  Musculoskeletal: no joint swelling, erythema, warmth, or tenderness.  ROM of all joints intact. Skin - no sores or suspicious lesions or rashes or color changes   LABS:  Last CBC Lab Results  Component Value Date   WBC 3.8 07/28/2023   HGB 13.8 07/28/2023   HCT 41.7 07/28/2023   MCV 89 07/28/2023   MCH 29.4 07/28/2023   RDW 13.6 07/28/2023   PLT 187 07/28/2023   Last metabolic panel Lab Results  Component Value Date   GLUCOSE 157 (H) 07/28/2023   NA 140 07/28/2023   K 4.6 07/28/2023   CL 103 07/28/2023   CO2 22 07/28/2023   BUN 25 (H) 07/28/2023   CREATININE 0.72 07/28/2023   EGFR 99 07/28/2023   CALCIUM  9.5 07/28/2023   PROT 6.7 07/28/2023   ALBUMIN 4.5 07/28/2023   LABGLOB 2.0 11/10/2022   BILITOT 0.4 07/28/2023   ALKPHOS 67 07/28/2023   AST 17 07/28/2023   ALT 19 07/28/2023   ANIONGAP 10 03/05/2023   Last lipids Lab Results  Component Value Date   CHOL 202 (H) 07/28/2023   HDL 39 (L) 07/28/2023   LDLCALC 144 (H) 07/28/2023   LDLDIRECT 140.0 09/24/2020   TRIG 106 07/28/2023   CHOLHDL 5.2 (H) 07/28/2023   Last hemoglobin A1c Lab Results  Component Value Date   HGBA1C 7.3 (H) 07/28/2023   Last thyroid  functions Lab Results  Component Value Date   TSH 3.690 07/28/2023   T3TOTAL 138 05/14/2022   Last vitamin D  Lab Results  Component Value Date   VD25OH 39.9 07/28/2023   IMPRESSION  AND PLAN:  1 Health maintenance exam: Reviewed age and gender appropriate health maintenance issues (prudent diet, regular exercise, health risks of tobacco and excessive alcohol, use of seatbelts, fire alarms in home, use of sunscreen).  Also reviewed age and gender appropriate health screening as well as vaccine  recommendations. Vaccines: Prevnar 20->declined.  Shingrix->declined.  Labs: cmet, thyroid  panel, vit D all normal 2 mo ago at cardiologist. Cervical ca screening: per Dr. Tomblin, GYN.  Pt with hx of hysterectomy for benign dx. Breast ca screening: mammogram UTD via GYN (Dr. Curlene). Colon ca screening: recall 12/2026.  #2 hypertension. Well-controlled when taking HCTZ 12.5 mg a day and enalapril  40mg  every day. Refilled medications today. About 2 months ago her electrolytes and creatinine were normal.  #3 hypercholesterolemia. Most recent lipid panel on 07/28/2023 showed LDL 144, HDL 39, triglycerides 106.  At that time she had already d/c'd her rosuva 20mg  b/c of wanting to make diet/exercise changes instead of meds. Therefore, she did not fill/take the 40mg  rosuva that the cardiologist rx'd her. Lipid panel and hepatic panel ordered today-->she'll return for fasting lab visit at her earliest convenience. She is agreeable to restarting rosuvastatin  20mg  at this time (rx sent) but if lipid panel is done in the next 7-10d and LDL is <60 then she can hold off.  #4 diabetes without complication. Currently managed by endocrinologist. Most recent hemoglobin A1c on 09/11/2023 was 7.4%. She is on Farxiga  10 mg a day.  She has made significant dietary and exercise improvements lately. Patient states today that her endocrinologist said if the follow-up in September is good then she can resume diabetes follow-up with me.  FOLLOW UP: Return in about 6 months (around 04/03/2024) for routine chronic illness f/u.  Signed:  Gerlene Hockey, MD           10/02/2023

## 2023-10-29 DIAGNOSIS — Z6832 Body mass index (BMI) 32.0-32.9, adult: Secondary | ICD-10-CM | POA: Diagnosis not present

## 2023-10-29 DIAGNOSIS — Z01419 Encounter for gynecological examination (general) (routine) without abnormal findings: Secondary | ICD-10-CM | POA: Diagnosis not present

## 2023-11-17 ENCOUNTER — Encounter: Payer: Self-pay | Admitting: Sports Medicine

## 2023-11-23 ENCOUNTER — Ambulatory Visit: Admitting: Family Medicine

## 2023-11-23 ENCOUNTER — Encounter: Payer: Self-pay | Admitting: Family Medicine

## 2023-11-23 VITALS — BP 125/83 | HR 71 | Temp 98.4°F | Resp 99 | Ht 63.0 in | Wt 188.6 lb

## 2023-11-23 DIAGNOSIS — H6121 Impacted cerumen, right ear: Secondary | ICD-10-CM | POA: Diagnosis not present

## 2023-11-23 NOTE — Progress Notes (Signed)
 OFFICE VISIT  11/23/2023  CC:  Chief Complaint  Patient presents with   Ear Concern    Right     Patient is a 53 y.o. female who presents for an ear concern.  HPI: Right ear feels muffled. Has been building for a few days or more. No ear pain.  Past Medical History:  Diagnosis Date   Achilles tendonosis of left lower extremity    Acute DVT (deep venous thrombosis) (HCC)    DVT 2012-january, L leg, in the context of post-C section. Again 2021, L leg, dx'd shortly after having covid infection->took eliquis  x 64mo.   Cardiac murmur 10/24/2022   Chest pain of uncertain etiology 10/24/2022   Cholelithiasis without obstruction 09/19/09 (u/s)   Chronic elbow pain, right    Dr. ONEIDA, MRI 2024->tendonopathy/partial tear of distal biceps tendon, thinning or partial tear of the lateral ulnar collateral ligament, spurring of the radial head and capitellum.   COVID-19 virus infection    approx 11/09/19---got antibody infusion   Deep venous thrombosis (HCC) 02/14/2020   Diabetes mellitus without complication (HCC) 03/21/2010   Elevated transaminase level 07/19/2012   Essential hypertension 03/21/2010   Qualifier: Diagnosis of   By: Lavinia LPN, Inocente Elks liver 09/19/09 (u/s)   Mildly elevated transaminases   GERD 03/21/2010   Health maintenance examination 07/20/2013   History of adenomatous polyp of colon 12/2018   2020.  Normal 01/14/22. Recall 5 yrs   History of herpes zoster 03/2012   History of laparoscopic-assisted vaginal hysterectomy 02/14/2020   Hyperlipidemia 03/21/2010   Qualifier: Diagnosis of   By: Lavinia LPN, Nancy         Left leg pain 12/11/2022   Left shoulder pain 09/30/2012   Low back pain 09/30/2012   Melanocytic nevi, unspecified 12/30/2018   NAFLD (nonalcoholic fatty liver disease) 90/96/7985   Obesity, Class II, BMI 35-39.9    Obesity, morbid (HCC) 10/19/2012   [facility]     Paresthesia of left arm 09/30/2012   Seizures (HCC)    as child- had 2 none  since- no current treatment- never dx'd with any seizure d/o    Subclinical hypothyroidism 2021   2021-22->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  01/14/22 no polyps. Recall 5 yrs.   Coronary calcium  score  10/2022   Score 26, 90th%'tile   LAPAROSCOPIC HYSTERECTOMY  02/2014   Ovaries remain (tubes out): done for DUB   TRANSTHORACIC ECHOCARDIOGRAM     11/2022 EF 55-60%, grd I DD, AV sclerosis w/out stenosis.   TUBAL LIGATION  2011   Dr. Thyra is her GYN.   WISDOM TOOTH EXTRACTION      Outpatient Medications Prior to Visit  Medication Sig Dispense Refill   celecoxib  (CELEBREX ) 100 MG capsule 1 capsule p.o. 2-3 times daily 90 capsule 11   Continuous Glucose Sensor (DEXCOM G7 SENSOR) MISC USE 1 SENSOR EVERY 10 DAYS     dapagliflozin  propanediol (FARXIGA ) 10 MG TABS tablet TAKE 1 TABLET DAILY 90 tablet 0   enalapril  (VASOTEC ) 20 MG tablet Take 2 tablets (40 mg total) by mouth daily. 180 tablet 1   hydrochlorothiazide  (HYDRODIURIL ) 12.5 MG tablet Take 1 tablet (12.5 mg total) by mouth daily. 90 tablet 1  nitroGLYCERIN  (NITRODUR - DOSED IN MG/24 HR) 0.2 mg/hr patch Cut and apply 1/4 patch to most painful area q24h. 30 patch 11   rosuvastatin  (CRESTOR ) 20 MG tablet Take 1 tablet (20 mg total) by mouth daily. 90 tablet 1   No facility-administered medications prior to visit.    Allergies  Allergen Reactions   Definity  [Perflutren  Lipid Microsphere] Hypertension     shoulder pain, back pain , chills and HTN   Codeine Hives    Review of Systems  As per HPI  PE:    11/23/2023    4:06 PM 10/02/2023    3:46 PM 07/24/2023    1:16 PM  Vitals with BMI  Height 5' 3 5' 3 5' 3  Weight 188 lbs 10 oz 182 lbs 3 oz 186 lbs 2 oz  BMI 33.42 32.28 32.98  Systolic 125 138 863  Diastolic 83 84 76  Pulse 71 69 80      Physical Exam  General: Alert and well-appearing. Right external auditory canal with 100% cerumen impaction. Left external auditory canal is clear, tympanic membrane normal.  LABS:  none  IMPRESSION AND PLAN:  Cerumen impaction, right ear. Consent obtained. Procedure: Cerumen Disimpaction  Warm water was applied and gentle ear lavage performed on right side. There were no complications and following the disimpaction the tympanic membrane was visible on the right side. Tympanic membranes are intact following the procedure.  Auditory canals are normal.  The patient reported relief of symptoms after removal of cerumen    An After Visit Summary was printed and given to the patient.  FOLLOW UP: Return if symptoms worsen or fail to improve.  Signed:  Gerlene Hockey, MD           11/23/2023

## 2023-12-14 DIAGNOSIS — E1165 Type 2 diabetes mellitus with hyperglycemia: Secondary | ICD-10-CM | POA: Diagnosis not present

## 2023-12-14 DIAGNOSIS — Z978 Presence of other specified devices: Secondary | ICD-10-CM | POA: Diagnosis not present

## 2023-12-14 DIAGNOSIS — Z6833 Body mass index (BMI) 33.0-33.9, adult: Secondary | ICD-10-CM | POA: Diagnosis not present

## 2023-12-14 LAB — HEMOGLOBIN A1C: Hemoglobin A1C: 7.3

## 2024-01-29 DIAGNOSIS — Z1231 Encounter for screening mammogram for malignant neoplasm of breast: Secondary | ICD-10-CM | POA: Diagnosis not present

## 2024-02-04 ENCOUNTER — Ambulatory Visit: Admitting: Family Medicine

## 2024-02-04 ENCOUNTER — Encounter: Payer: Self-pay | Admitting: Family Medicine

## 2024-02-04 VITALS — BP 147/80 | HR 85 | Temp 97.4°F | Ht 63.0 in | Wt 190.8 lb

## 2024-02-04 DIAGNOSIS — M26629 Arthralgia of temporomandibular joint, unspecified side: Secondary | ICD-10-CM | POA: Diagnosis not present

## 2024-02-04 DIAGNOSIS — H9202 Otalgia, left ear: Secondary | ICD-10-CM | POA: Diagnosis not present

## 2024-02-04 DIAGNOSIS — R6884 Jaw pain: Secondary | ICD-10-CM

## 2024-02-04 NOTE — Progress Notes (Unsigned)
 OFFICE VISIT  02/04/2024  CC:  Chief Complaint  Patient presents with   Ear Pain    Left ear ache, headache, and jaw pain    Patient is a 53 y.o. female who presents for ear concerns.  HPI: 2 to 3-day history of pain in left ear anterior aspect, radiating down into the back of the throat at times.  Headache intermittently. The pain is not burning or lancinating.  Past Medical History:  Diagnosis Date   Achilles tendonosis of left lower extremity    Acute DVT (deep venous thrombosis) (HCC)    DVT 2012-january, L leg, in the context of post-C section. Again 2021, L leg, dx'd shortly after having covid infection->took eliquis  x 4mo.   Cardiac murmur 10/24/2022   Chest pain of uncertain etiology 10/24/2022   Cholelithiasis without obstruction 09/19/09 (u/s)   Chronic elbow pain, right    Dr. ONEIDA, MRI 2024->tendonopathy/partial tear of distal biceps tendon, thinning or partial tear of the lateral ulnar collateral ligament, spurring of the radial head and capitellum.   COVID-19 virus infection    approx 11/09/19---got antibody infusion   Deep venous thrombosis (HCC) 02/14/2020   Diabetes mellitus without complication (HCC) 03/21/2010   Elevated transaminase level 07/19/2012   Essential hypertension 03/21/2010   Qualifier: Diagnosis of   By: Lavinia LPN, Inocente Elks liver 09/19/09 (u/s)   Mildly elevated transaminases   GERD 03/21/2010   Health maintenance examination 07/20/2013   History of adenomatous polyp of colon 12/2018   2020.  Normal 01/14/22. Recall 5 yrs   History of herpes zoster 03/2012   History of laparoscopic-assisted vaginal hysterectomy 02/14/2020   Hyperlipidemia 03/21/2010   Qualifier: Diagnosis of   By: Lavinia LPN, Nancy         Left leg pain 12/11/2022   Left shoulder pain 09/30/2012   Low back pain 09/30/2012   Melanocytic nevi, unspecified 12/30/2018   NAFLD (nonalcoholic fatty liver disease) 90/96/7985   Obesity, Class II, BMI 35-39.9    Obesity,  morbid (HCC) 10/19/2012   [facility]     Paresthesia of left arm 09/30/2012   Seizures (HCC)    as child- had 2 none since- no current treatment- never dx'd with any seizure d/o    Subclinical hypothyroidism 2021   2021-22->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  01/14/22 no polyps. Recall 5 yrs.   Coronary calcium  score  10/2022   Score 26, 90th%'tile   LAPAROSCOPIC HYSTERECTOMY  02/2014   Ovaries remain (tubes out): done for DUB   TRANSTHORACIC ECHOCARDIOGRAM     11/2022 EF 55-60%, grd I DD, AV sclerosis w/out stenosis.   TUBAL LIGATION  2011   Dr. Thyra is her GYN.   WISDOM TOOTH EXTRACTION      Outpatient Medications Prior to Visit  Medication Sig Dispense Refill   celecoxib  (CELEBREX ) 100 MG capsule 1 capsule p.o. 2-3 times daily 90 capsule 11   Continuous Glucose Sensor (DEXCOM G7 SENSOR) MISC USE 1 SENSOR EVERY 10 DAYS     dapagliflozin  propanediol (FARXIGA ) 10 MG TABS tablet TAKE 1 TABLET DAILY 90 tablet 0   enalapril  (VASOTEC ) 20 MG tablet Take 2 tablets (40 mg total) by mouth daily. 180 tablet 1  hydrochlorothiazide  (HYDRODIURIL ) 12.5 MG tablet Take 1 tablet (12.5 mg total) by mouth daily. 90 tablet 1   nitroGLYCERIN  (NITRODUR - DOSED IN MG/24 HR) 0.2 mg/hr patch Cut and apply 1/4 patch to most painful area q24h. 30 patch 11   rosuvastatin  (CRESTOR ) 20 MG tablet Take 1 tablet (20 mg total) by mouth daily. 90 tablet 1   No facility-administered medications prior to visit.    Allergies  Allergen Reactions   Definity  [Perflutren  Lipid Microsphere] Hypertension     shoulder pain, back pain , chills and HTN   Codeine Hives    Review of Systems  As per HPI  PE:    02/04/2024   11:38 AM 11/23/2023    4:06 PM 10/02/2023    3:46 PM  Vitals with BMI  Height 5' 3 5' 3 5' 3  Weight 190 lbs  13 oz 188 lbs 10 oz 182 lbs 3 oz  BMI 33.81 33.42 32.28  Systolic 147 125 861  Diastolic 80 83 84  Pulse 85 71 69     Physical Exam  General: Alert and well-appearing. ENT: Ears: External auditory canals normal, TMs normal. Eyes: no injection, icteris, swelling, or exudate.  EOMI, PERRLA. Mouth: lips without lesion/swelling.  Oral mucosa pink and moist. Oropharynx without erythema, exudate, or swelling.  TMJ region bilaterally without tenderness or subluxation. Skin: No lesion or rash.   LABS:  Last CBC Lab Results  Component Value Date   WBC 3.8 07/28/2023   HGB 13.8 07/28/2023   HCT 41.7 07/28/2023   MCV 89 07/28/2023   MCH 29.4 07/28/2023   RDW 13.6 07/28/2023   PLT 187 07/28/2023   Last metabolic panel Lab Results  Component Value Date   GLUCOSE 157 (H) 07/28/2023   NA 140 07/28/2023   K 4.6 07/28/2023   CL 103 07/28/2023   CO2 22 07/28/2023   BUN 25 (H) 07/28/2023   CREATININE 0.72 07/28/2023   EGFR 99 07/28/2023   CALCIUM  9.5 07/28/2023   PROT 6.7 07/28/2023   ALBUMIN 4.5 07/28/2023   LABGLOB 2.0 11/10/2022   BILITOT 0.4 07/28/2023   ALKPHOS 67 07/28/2023   AST 17 07/28/2023   ALT 19 07/28/2023   ANIONGAP 10 03/05/2023   Last hemoglobin A1c Lab Results  Component Value Date   HGBA1C 7.3 (H) 07/28/2023   IMPRESSION AND PLAN:  TMJ pain.  Improving today.   An After Visit Summary was printed and given to the patient.  FOLLOW UP: Return if symptoms worsen or fail to improve.  Signed:  Gerlene Hockey, MD           02/04/2024

## 2024-02-16 ENCOUNTER — Encounter: Payer: Self-pay | Admitting: Family Medicine

## 2024-02-16 DIAGNOSIS — G8929 Other chronic pain: Secondary | ICD-10-CM

## 2024-02-16 NOTE — Telephone Encounter (Signed)
 Pt was previously getting Celebrex  refilled by Dr.Thekkekandam; last refill 12/11/22 (90,11). Last RCI or CPE appt 10/02/23.   Please fill, if appropriate. Rx pending

## 2024-02-17 ENCOUNTER — Other Ambulatory Visit: Payer: Self-pay

## 2024-02-17 ENCOUNTER — Encounter: Payer: Self-pay | Admitting: Family Medicine

## 2024-02-17 DIAGNOSIS — G8929 Other chronic pain: Secondary | ICD-10-CM

## 2024-02-17 MED ORDER — CELECOXIB 100 MG PO CAPS: ORAL_CAPSULE | ORAL | 11 refills | Status: DC

## 2024-02-17 MED ORDER — CELECOXIB 100 MG PO CAPS: ORAL_CAPSULE | ORAL | 1 refills | Status: DC

## 2024-02-17 NOTE — Telephone Encounter (Signed)
 No further action needed.

## 2024-02-17 NOTE — Telephone Encounter (Signed)
 Spoke with pt, she was advised rx was sent for 90 d/s. She would like the prescription sent to [facility] instead. Rx sent

## 2024-03-07 LAB — POCT RAPID STREP A (OFFICE): Rapid Strep A Screen: NEGATIVE

## 2024-03-28 ENCOUNTER — Encounter: Payer: Self-pay | Admitting: Family Medicine

## 2024-03-28 ENCOUNTER — Ambulatory Visit: Admitting: Family Medicine

## 2024-03-28 VITALS — BP 127/85 | HR 67 | Temp 97.2°F | Wt 195.2 lb

## 2024-03-28 DIAGNOSIS — H6122 Impacted cerumen, left ear: Secondary | ICD-10-CM | POA: Diagnosis not present

## 2024-03-28 NOTE — Progress Notes (Signed)
 OFFICE NOTE  03/28/2024  CC:  Chief Complaint  Patient presents with   Ear Fullness     HPI: Patient is a 54 y.o. Caucasian female who is here for left ear muffled.  MEDS:  Outpatient Medications Prior to Visit  Medication Sig Dispense Refill   celecoxib  (CELEBREX ) 100 MG capsule 1 capsule p.o. daily 90 capsule 1   Continuous Glucose Sensor (DEXCOM G7 SENSOR) MISC USE 1 SENSOR EVERY 10 DAYS     dapagliflozin  propanediol (FARXIGA ) 10 MG TABS tablet TAKE 1 TABLET DAILY 90 tablet 0   enalapril  (VASOTEC ) 20 MG tablet Take 2 tablets (40 mg total) by mouth daily. 180 tablet 1   hydrochlorothiazide  (HYDRODIURIL ) 12.5 MG tablet Take 1 tablet (12.5 mg total) by mouth daily. 90 tablet 1   nitroGLYCERIN  (NITRODUR - DOSED IN MG/24 HR) 0.2 mg/hr patch Cut and apply 1/4 patch to most painful area q24h. 30 patch 11   rosuvastatin  (CRESTOR ) 20 MG tablet Take 1 tablet (20 mg total) by mouth daily. 90 tablet 1   No facility-administered medications prior to visit.    PE: Blood pressure 127/85, pulse 67, temperature (!) 97.2 F (36.2 C), weight 195 lb 3.2 oz (88.5 kg), last menstrual period 11/29/2013, SpO2 99%. Gen: Alert, well appearing.  Patient is oriented to person, place, time, and situation. LEFT EAC with 100% occlusion by cerumen. Right EAC clear, TM normal.  IMPRESSION AND PLAN: Left cerumen impaction. Consent obtained. Procedure: Cerumen Disimpaction  Warm water was applied and gentle ear lavage performed on the left. There were no complications and following the disimpaction the tympanic membrane was visible on the left. Tympanic membranes are intact following the procedure.  Auditory canals are normal.  The patient reported relief of symptoms after removal of cerumen   An After Visit Summary was printed and given to the patient.  FOLLOW UP: As needed   Signed:  Gerlene Hockey, MD           03/28/2024

## 2024-04-05 ENCOUNTER — Telehealth: Payer: Self-pay

## 2024-04-05 NOTE — Telephone Encounter (Signed)
 MyChart message reminder sent regarding mammogram.

## 2024-04-18 ENCOUNTER — Other Ambulatory Visit: Payer: Self-pay

## 2024-04-20 ENCOUNTER — Other Ambulatory Visit: Payer: Self-pay

## 2024-04-20 DIAGNOSIS — G8929 Other chronic pain: Secondary | ICD-10-CM

## 2024-04-20 MED ORDER — CELECOXIB 100 MG PO CAPS: ORAL_CAPSULE | ORAL | 0 refills | Status: AC
